# Patient Record
Sex: Female | Born: 1975
Health system: Southern US, Community
[De-identification: ages and names within clinical notes are randomized; demographics above are authoritative.]

## PROBLEM LIST (undated history)

## (undated) DIAGNOSIS — I781 Nevus, non-neoplastic: Secondary | ICD-10-CM

## (undated) DIAGNOSIS — K5900 Constipation, unspecified: Secondary | ICD-10-CM

## (undated) DIAGNOSIS — K635 Polyp of colon: Secondary | ICD-10-CM

## (undated) DIAGNOSIS — K649 Unspecified hemorrhoids: Secondary | ICD-10-CM

## (undated) DIAGNOSIS — B029 Zoster without complications: Secondary | ICD-10-CM

## (undated) DIAGNOSIS — K219 Gastro-esophageal reflux disease without esophagitis: Secondary | ICD-10-CM

## (undated) DIAGNOSIS — E041 Nontoxic single thyroid nodule: Secondary | ICD-10-CM

## (undated) DIAGNOSIS — G43909 Migraine, unspecified, not intractable, without status migrainosus: Secondary | ICD-10-CM

## (undated) DIAGNOSIS — R87629 Unspecified abnormal cytological findings in specimens from vagina: Secondary | ICD-10-CM

## (undated) DIAGNOSIS — J302 Other seasonal allergic rhinitis: Secondary | ICD-10-CM

## (undated) DIAGNOSIS — F419 Anxiety disorder, unspecified: Secondary | ICD-10-CM

## (undated) DIAGNOSIS — D219 Benign neoplasm of connective and other soft tissue, unspecified: Secondary | ICD-10-CM

## (undated) DIAGNOSIS — R7303 Prediabetes: Secondary | ICD-10-CM

## (undated) DIAGNOSIS — M1990 Unspecified osteoarthritis, unspecified site: Secondary | ICD-10-CM

## (undated) DIAGNOSIS — F32A Depression, unspecified: Secondary | ICD-10-CM

## (undated) DIAGNOSIS — I10 Essential (primary) hypertension: Secondary | ICD-10-CM

## (undated) DIAGNOSIS — D259 Leiomyoma of uterus, unspecified: Secondary | ICD-10-CM

## (undated) DIAGNOSIS — H9319 Tinnitus, unspecified ear: Secondary | ICD-10-CM

## (undated) DIAGNOSIS — Z8759 Personal history of other complications of pregnancy, childbirth and the puerperium: Secondary | ICD-10-CM

## (undated) DIAGNOSIS — M2550 Pain in unspecified joint: Secondary | ICD-10-CM

## (undated) DIAGNOSIS — E042 Nontoxic multinodular goiter: Secondary | ICD-10-CM

## (undated) DIAGNOSIS — N920 Excessive and frequent menstruation with regular cycle: Secondary | ICD-10-CM

## (undated) DIAGNOSIS — R1033 Periumbilical pain: Secondary | ICD-10-CM

## (undated) DIAGNOSIS — D649 Anemia, unspecified: Secondary | ICD-10-CM

## (undated) DIAGNOSIS — R600 Localized edema: Secondary | ICD-10-CM

## (undated) HISTORY — DX: Periumbilical pain: R10.33

## (undated) HISTORY — PX: CYSTECTOMY: SUR359

## (undated) HISTORY — DX: Polyp of colon: K63.5

## (undated) HISTORY — DX: Tinnitus, unspecified ear: H93.19

## (undated) HISTORY — DX: Unspecified abnormal cytological findings in specimens from vagina: R87.629

## (undated) HISTORY — DX: Nontoxic single thyroid nodule: E04.1

## (undated) HISTORY — PX: WISDOM TOOTH EXTRACTION: SHX21

## (undated) HISTORY — DX: Localized edema: R60.0

## (undated) HISTORY — PX: COLONOSCOPY, ESOPHAGOGASTRODUODENOSCOPY (EGD) AND ESOPHAGEAL DILATION: SHX5781

## (undated) HISTORY — DX: Excessive and frequent menstruation with regular cycle: N92.0

## (undated) HISTORY — DX: Unspecified hemorrhoids: K64.9

## (undated) HISTORY — DX: Other seasonal allergic rhinitis: J30.2

## (undated) HISTORY — PX: GANGLION CYST EXCISION: SHX1691

## (undated) HISTORY — DX: Pain in unspecified joint: M25.50

## (undated) HISTORY — DX: Constipation, unspecified: K59.00

## (undated) HISTORY — DX: Nevus, non-neoplastic: I78.1

## (undated) HISTORY — DX: Morbid (severe) obesity due to excess calories: E66.01

## (undated) HISTORY — PX: COLONOSCOPY: SHX174

---
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
## 1998-10-17 ENCOUNTER — Emergency Department (HOSPITAL_COMMUNITY): Admission: EM | Admit: 1998-10-17 | Discharge: 1998-10-17 | Payer: Self-pay | Admitting: Emergency Medicine

## 1999-06-23 ENCOUNTER — Inpatient Hospital Stay: Admission: AD | Admit: 1999-06-23 | Discharge: 1999-06-23 | Payer: Self-pay | Admitting: Obstetrics and Gynecology

## 1999-09-30 ENCOUNTER — Emergency Department (HOSPITAL_COMMUNITY): Admission: EM | Admit: 1999-09-30 | Discharge: 1999-09-30 | Payer: Self-pay | Admitting: Emergency Medicine

## 2000-09-29 ENCOUNTER — Other Ambulatory Visit: Admission: RE | Admit: 2000-09-29 | Discharge: 2000-09-29 | Payer: Self-pay | Admitting: *Deleted

## 2000-10-02 ENCOUNTER — Encounter: Admission: RE | Admit: 2000-10-02 | Discharge: 2000-10-02 | Payer: Self-pay | Admitting: Family Medicine

## 2000-10-02 ENCOUNTER — Encounter: Payer: Self-pay | Admitting: Family Medicine

## 2000-11-05 ENCOUNTER — Emergency Department (HOSPITAL_COMMUNITY): Admission: EM | Admit: 2000-11-05 | Discharge: 2000-11-06 | Payer: Self-pay | Admitting: Emergency Medicine

## 2000-11-05 ENCOUNTER — Encounter: Payer: Self-pay | Admitting: Emergency Medicine

## 2000-11-09 ENCOUNTER — Observation Stay (HOSPITAL_COMMUNITY): Admission: AD | Admit: 2000-11-09 | Discharge: 2000-11-10 | Payer: Self-pay | Admitting: Obstetrics and Gynecology

## 2000-11-10 ENCOUNTER — Encounter: Payer: Self-pay | Admitting: Obstetrics and Gynecology

## 2000-12-04 ENCOUNTER — Other Ambulatory Visit: Admission: RE | Admit: 2000-12-04 | Discharge: 2000-12-04 | Payer: Self-pay | Admitting: Obstetrics and Gynecology

## 2001-04-25 ENCOUNTER — Inpatient Hospital Stay (HOSPITAL_COMMUNITY): Admission: AD | Admit: 2001-04-25 | Discharge: 2001-04-25 | Payer: Self-pay | Admitting: Obstetrics and Gynecology

## 2001-04-26 ENCOUNTER — Inpatient Hospital Stay (HOSPITAL_COMMUNITY): Admission: AD | Admit: 2001-04-26 | Discharge: 2001-04-26 | Payer: Self-pay | Admitting: Obstetrics and Gynecology

## 2001-06-13 ENCOUNTER — Inpatient Hospital Stay (HOSPITAL_COMMUNITY): Admission: AD | Admit: 2001-06-13 | Discharge: 2001-06-16 | Payer: Self-pay | Admitting: Obstetrics and Gynecology

## 2001-06-18 ENCOUNTER — Encounter: Admission: RE | Admit: 2001-06-18 | Discharge: 2001-07-18 | Payer: Self-pay | Admitting: Obstetrics and Gynecology

## 2001-09-18 ENCOUNTER — Other Ambulatory Visit: Admission: RE | Admit: 2001-09-18 | Discharge: 2001-09-18 | Payer: Self-pay | Admitting: Obstetrics and Gynecology

## 2001-09-28 ENCOUNTER — Emergency Department (HOSPITAL_COMMUNITY): Admission: EM | Admit: 2001-09-28 | Discharge: 2001-09-29 | Payer: Self-pay | Admitting: Emergency Medicine

## 2002-04-07 ENCOUNTER — Emergency Department (HOSPITAL_COMMUNITY): Admission: EM | Admit: 2002-04-07 | Discharge: 2002-04-07 | Payer: Self-pay | Admitting: Emergency Medicine

## 2002-06-26 ENCOUNTER — Encounter: Payer: Self-pay | Admitting: Emergency Medicine

## 2002-06-26 ENCOUNTER — Emergency Department (HOSPITAL_COMMUNITY): Admission: EM | Admit: 2002-06-26 | Discharge: 2002-06-26 | Payer: Self-pay | Admitting: Emergency Medicine

## 2002-10-03 ENCOUNTER — Emergency Department (HOSPITAL_COMMUNITY): Admission: EM | Admit: 2002-10-03 | Discharge: 2002-10-04 | Payer: Self-pay | Admitting: Emergency Medicine

## 2003-05-01 ENCOUNTER — Emergency Department (HOSPITAL_COMMUNITY): Admission: EM | Admit: 2003-05-01 | Discharge: 2003-05-01 | Payer: Self-pay | Admitting: Emergency Medicine

## 2003-09-20 ENCOUNTER — Emergency Department (HOSPITAL_COMMUNITY): Admission: AD | Admit: 2003-09-20 | Discharge: 2003-09-20 | Payer: Self-pay | Admitting: Family Medicine

## 2003-09-29 ENCOUNTER — Emergency Department (HOSPITAL_COMMUNITY): Admission: AD | Admit: 2003-09-29 | Discharge: 2003-09-29 | Payer: Self-pay | Admitting: Family Medicine

## 2003-12-28 ENCOUNTER — Emergency Department (HOSPITAL_COMMUNITY): Admission: AD | Admit: 2003-12-28 | Discharge: 2003-12-28 | Payer: Self-pay | Admitting: Family Medicine

## 2004-02-02 ENCOUNTER — Emergency Department (HOSPITAL_COMMUNITY): Admission: EM | Admit: 2004-02-02 | Discharge: 2004-02-02 | Payer: Self-pay | Admitting: Family Medicine

## 2004-10-04 ENCOUNTER — Encounter: Admission: RE | Admit: 2004-10-04 | Discharge: 2004-10-04 | Payer: Self-pay | Admitting: Family Medicine

## 2005-10-18 ENCOUNTER — Encounter: Admission: RE | Admit: 2005-10-18 | Discharge: 2006-01-16 | Payer: Self-pay | Admitting: Family Medicine

## 2006-02-27 ENCOUNTER — Emergency Department (HOSPITAL_COMMUNITY): Admission: EM | Admit: 2006-02-27 | Discharge: 2006-02-27 | Payer: Self-pay | Admitting: Family Medicine

## 2006-03-01 ENCOUNTER — Emergency Department (HOSPITAL_COMMUNITY): Admission: EM | Admit: 2006-03-01 | Discharge: 2006-03-01 | Payer: Self-pay | Admitting: Emergency Medicine

## 2006-09-27 ENCOUNTER — Emergency Department (HOSPITAL_COMMUNITY): Admission: EM | Admit: 2006-09-27 | Discharge: 2006-09-27 | Payer: Self-pay | Admitting: *Deleted

## 2007-04-09 ENCOUNTER — Emergency Department (HOSPITAL_COMMUNITY): Admission: EM | Admit: 2007-04-09 | Discharge: 2007-04-09 | Payer: Self-pay | Admitting: Emergency Medicine

## 2007-04-16 ENCOUNTER — Ambulatory Visit: Payer: Self-pay | Admitting: Internal Medicine

## 2007-04-23 ENCOUNTER — Ambulatory Visit: Payer: Self-pay | Admitting: *Deleted

## 2007-04-27 ENCOUNTER — Ambulatory Visit: Payer: Self-pay | Admitting: Internal Medicine

## 2007-05-01 ENCOUNTER — Ambulatory Visit: Payer: Self-pay | Admitting: Internal Medicine

## 2007-05-10 ENCOUNTER — Ambulatory Visit: Payer: Self-pay | Admitting: Internal Medicine

## 2007-08-30 ENCOUNTER — Telehealth (INDEPENDENT_AMBULATORY_CARE_PROVIDER_SITE_OTHER): Payer: Self-pay | Admitting: Internal Medicine

## 2007-08-31 ENCOUNTER — Ambulatory Visit: Payer: Self-pay | Admitting: Internal Medicine

## 2007-08-31 DIAGNOSIS — J45909 Unspecified asthma, uncomplicated: Secondary | ICD-10-CM

## 2007-08-31 DIAGNOSIS — B9789 Other viral agents as the cause of diseases classified elsewhere: Secondary | ICD-10-CM | POA: Insufficient documentation

## 2007-08-31 DIAGNOSIS — J309 Allergic rhinitis, unspecified: Secondary | ICD-10-CM

## 2007-08-31 HISTORY — DX: Allergic rhinitis, unspecified: J30.9

## 2007-08-31 HISTORY — DX: Unspecified asthma, uncomplicated: J45.909

## 2008-02-06 ENCOUNTER — Emergency Department (HOSPITAL_COMMUNITY): Admission: EM | Admit: 2008-02-06 | Discharge: 2008-02-06 | Payer: Self-pay | Admitting: Emergency Medicine

## 2008-02-06 ENCOUNTER — Telehealth (INDEPENDENT_AMBULATORY_CARE_PROVIDER_SITE_OTHER): Payer: Self-pay | Admitting: Internal Medicine

## 2008-02-28 ENCOUNTER — Encounter (INDEPENDENT_AMBULATORY_CARE_PROVIDER_SITE_OTHER): Payer: Self-pay | Admitting: Internal Medicine

## 2008-05-22 ENCOUNTER — Encounter: Admission: RE | Admit: 2008-05-22 | Discharge: 2008-05-22 | Payer: Self-pay | Admitting: Rheumatology

## 2008-06-06 ENCOUNTER — Encounter (INDEPENDENT_AMBULATORY_CARE_PROVIDER_SITE_OTHER): Payer: Self-pay | Admitting: Internal Medicine

## 2008-06-06 DIAGNOSIS — E559 Vitamin D deficiency, unspecified: Secondary | ICD-10-CM | POA: Insufficient documentation

## 2008-07-15 ENCOUNTER — Encounter (INDEPENDENT_AMBULATORY_CARE_PROVIDER_SITE_OTHER): Payer: Self-pay | Admitting: Orthopedic Surgery

## 2008-07-15 ENCOUNTER — Ambulatory Visit (HOSPITAL_BASED_OUTPATIENT_CLINIC_OR_DEPARTMENT_OTHER): Admission: RE | Admit: 2008-07-15 | Discharge: 2008-07-15 | Payer: Self-pay | Admitting: Orthopedic Surgery

## 2008-08-19 ENCOUNTER — Emergency Department (HOSPITAL_COMMUNITY): Admission: EM | Admit: 2008-08-19 | Discharge: 2008-08-19 | Payer: Self-pay | Admitting: Emergency Medicine

## 2008-11-05 ENCOUNTER — Emergency Department (HOSPITAL_COMMUNITY): Admission: EM | Admit: 2008-11-05 | Discharge: 2008-11-05 | Payer: Self-pay | Admitting: Emergency Medicine

## 2009-03-11 ENCOUNTER — Encounter (INDEPENDENT_AMBULATORY_CARE_PROVIDER_SITE_OTHER): Payer: Self-pay | Admitting: Internal Medicine

## 2009-06-08 ENCOUNTER — Emergency Department (HOSPITAL_COMMUNITY): Admission: EM | Admit: 2009-06-08 | Discharge: 2009-06-08 | Payer: Self-pay | Admitting: Emergency Medicine

## 2009-06-22 ENCOUNTER — Inpatient Hospital Stay (HOSPITAL_COMMUNITY): Admission: AD | Admit: 2009-06-22 | Discharge: 2009-06-22 | Payer: Self-pay | Admitting: Obstetrics and Gynecology

## 2009-07-13 ENCOUNTER — Inpatient Hospital Stay (HOSPITAL_COMMUNITY): Admission: AD | Admit: 2009-07-13 | Discharge: 2009-07-13 | Payer: Self-pay | Admitting: Obstetrics and Gynecology

## 2009-07-19 ENCOUNTER — Inpatient Hospital Stay (HOSPITAL_COMMUNITY): Admission: AD | Admit: 2009-07-19 | Discharge: 2009-07-19 | Payer: Self-pay | Admitting: Obstetrics and Gynecology

## 2009-10-31 ENCOUNTER — Inpatient Hospital Stay (HOSPITAL_COMMUNITY): Admission: AD | Admit: 2009-10-31 | Discharge: 2009-10-31 | Payer: Self-pay | Admitting: Obstetrics & Gynecology

## 2009-12-06 ENCOUNTER — Emergency Department (HOSPITAL_COMMUNITY): Admission: EM | Admit: 2009-12-06 | Discharge: 2009-12-06 | Payer: Self-pay | Admitting: Family Medicine

## 2010-05-26 ENCOUNTER — Emergency Department (HOSPITAL_COMMUNITY): Admission: EM | Admit: 2010-05-26 | Discharge: 2010-05-26 | Payer: Self-pay | Admitting: Emergency Medicine

## 2010-09-23 ENCOUNTER — Inpatient Hospital Stay (HOSPITAL_COMMUNITY): Admission: AD | Admit: 2010-09-23 | Discharge: 2010-01-14 | Payer: Self-pay | Admitting: Obstetrics and Gynecology

## 2010-10-13 ENCOUNTER — Emergency Department (HOSPITAL_COMMUNITY)
Admission: EM | Admit: 2010-10-13 | Discharge: 2010-10-14 | Payer: Self-pay | Source: Home / Self Care | Admitting: Emergency Medicine

## 2010-11-18 NOTE — Miscellaneous (Signed)
Summary: Record update.  Clinical Lists Changes  Problems: Added new problem of UNSPECIFIED VITAMIN D DEFICIENCY (ICD-268.9) - Dr. Debarah Crape. seen for hand and wrist tenderness and soreness. Medications: Added new medication of VITAMIN D (ERGOCALCIFEROL) 50000 UNIT CAPS (ERGOCALCIFEROL) 1 tab by mouth monthly for 3 months:  Dr. Corliss Skains

## 2010-11-18 NOTE — Assessment & Plan Note (Signed)
Summary: CONGESTED/DIARREAH//KT   Vital Signs:  Patient Profile:   35 Years Old Female Height:     62 inches Weight:      178 pounds BMI:     32.67 BSA:     1.82 Temp:     97.0 degrees F oral Pulse rate:   68 / minute Pulse rhythm:   regular Resp:     18 per minute BP sitting:   130 / 78  (left arm)  Pt. in pain?   yes  Vitals Entered By: Gennaro Africa, CMA              Is Patient Diabetic? No  Does patient need assistance? Functional Status Self care Ambulation Normal     Chief Complaint:  body aches, congestion, and sore throat.  History of Present Illness: 5 days ago--developed muscle aches in legs, Chest tightness and dyspnea.  Felt okay for 2 days, then redeveloped muscle aches 2 days ago, head congestion, headaches.  Started Thera Flu and aches better.  Head congestion, ear and throat pain.  Has also had nausea, vomiting, and diarrhea--last was yesterday.  Po intake not good.  Taking fluids, but not well.  Highest temp was 99.3 yesterday.  Using Flonase 2 sprays each nostril once daily, Allegra D 60 mg twice daily.          Physical Exam  General:     alert.   Eyes:     No corneal or conjunctival inflammation noted. EOMI. Perrla. Funduscopic exam benign, without hemorrhages, exudates or papilledema. Vision grossly normal. Ears:     External ear exam shows no significant lesions or deformities.  Otoscopic examination reveals clear canals, tympanic membranes are intact bilaterally without bulging, retraction, inflammation or discharge. Hearing is grossly normal bilaterally. Nose:     mucosal erythema and mucosal edema. clear discharge.  Mouth:     pharynx pink and moist.   Neck:     No deformities, masses, or tenderness noted. Lungs:     Normal respiratory effort, chest expands symmetrically. Lungs are clear to auscultation, no crackles or wheezes. Heart:     Normal rate and regular rhythm. S1 and S2 normal without gallop, murmur, click, rub or other  extra sounds.    Impression & Recommendations:  Problem # 1:  VIRAL INFECTION (ICD-079.99) Push fluids Motrin as needed sore throat, aches Continue Allegra D and Flonase.  Problem # 2:  ASTHMA (ICD-493.90) Needs to pick up Advair--has been available at [facility] for some time.  updated medication list for this problem includes:    Albuterol 90 Mcg/act Aers (Albuterol) .Marland Kitchen... 2 puffs q 4h as needed wheezing  Her updated medication list for this problem includes:    Albuterol 90 Mcg/act Aers (Albuterol) .Marland Kitchen... 2 puffs q 4h as needed wheezing    Advair Diskus 250-50 Mcg/dose Misc (Fluticasone-salmeterol) .Marland Kitchen... 1 inhalation two times a day   Complete Medication List: 1)  Albuterol 90 Mcg/act Aers (Albuterol) .... 2 puffs q 4h as needed wheezing 2)  Advair Diskus 250-50 Mcg/dose Misc (Fluticasone-salmeterol) .Marland Kitchen.. 1 inhalation two times a day   Patient Instructions: 1)  Go pick up Advair at [facility] today.--one inhalation twice daily. 2)  Ibuprofen 600 mg every 6 hours with food as needed for sore throat, headache, aches. 3)  Continue Allegra D and Flonase. 4)  Continue Albuterol as needed every four hours. 5)  Saline nasal spray as needed for congestion.    Prescriptions: ALBUTEROL 90 MCG/ACT  AERS (ALBUTEROL) 2  puffs q 4h as needed wheezing  #1 x 0   Entered and Authorized by:   Julieanne Manson MD   Signed by:   Julieanne Manson MD on 08/31/2007   Method used:   Print then Give to Patient   RxID:   [id number]  ]

## 2010-11-18 NOTE — Progress Notes (Signed)
Summary: office visit  Phone Note Call from Patient Call back at Home Phone [phone] Call back at [phone]   Caller: Patient Summary of Call: The patient is under various health problems including chest pain, nasal congestion, cough, sore throat and back and leg pain. Dr. Delrae Alfred Initial call taken by: Manon Hilding,  August 30, 2007 8:38 AM  Follow-up for Phone Call        Appt Scheduled Follow-up by: Vesta Mixer CMA,  August 31, 2007 2:12 PM

## 2010-11-18 NOTE — Letter (Signed)
Summary: External Correspondence  External Correspondence   Imported By: Arta Bruce 05/05/2009 12:17:38  _____________________________________________________________________  External Attachment:    Type:   Image     Comment:   External Document

## 2010-11-18 NOTE — Progress Notes (Signed)
  Phone Note Outgoing Call   Summary of Call: Tiffany, This has not been addressed on paper chart--multiple faxes for prior authorization.  Please call pt. asap and find out what antihistamines she has used in the past and whether they worked.  Also, call and see what is covered ([employer]). Initial call taken by: Julieanne Manson MD,  February 06, 2008 9:39 AM  Follow-up for Phone Call        Left message on answering machine for pt to return call  Follow-up by: Ashlyn Cartner SMA,  February 07, 2008 9:04 AM  Additional Follow-up for Phone Call Additional follow up Details #1::        Left message on answering machine for pt to return call  Additional Follow-up by: Vesta Mixer CMA,  Feb 19, 2008 12:43 PM

## 2010-11-18 NOTE — Letter (Signed)
Summary: MAILED RECORDS TO [facility]UMED [facility]RGENT CARE  MAILED RECORDS TO [facility]UMED [facility]RGENT [facility]   Imported By: Arta Bruce 02/28/2008 14:44:47  _____________________________________________________________________  External Attachment:    Type:   Image     Comment:   External Document

## 2010-12-29 ENCOUNTER — Emergency Department (HOSPITAL_COMMUNITY)
Admission: EM | Admit: 2010-12-29 | Discharge: 2010-12-30 | Disposition: A | Discharge status: Home / Self Care | Payer: BC Managed Care – PPO | Attending: Emergency Medicine | Admitting: Emergency Medicine

## 2010-12-29 DIAGNOSIS — J45909 Unspecified asthma, uncomplicated: Secondary | ICD-10-CM | POA: Insufficient documentation

## 2010-12-29 DIAGNOSIS — H109 Unspecified conjunctivitis: Secondary | ICD-10-CM | POA: Insufficient documentation

## 2010-12-29 DIAGNOSIS — H11419 Vascular abnormalities of conjunctiva, unspecified eye: Secondary | ICD-10-CM | POA: Insufficient documentation

## 2010-12-29 DIAGNOSIS — H5789 Other specified disorders of eye and adnexa: Secondary | ICD-10-CM | POA: Insufficient documentation

## 2010-12-31 ENCOUNTER — Emergency Department (HOSPITAL_COMMUNITY): Payer: BC Managed Care – PPO

## 2010-12-31 ENCOUNTER — Emergency Department (HOSPITAL_COMMUNITY)
Admission: EM | Admit: 2010-12-31 | Discharge: 2010-12-31 | Disposition: A | Discharge status: Home / Self Care | Payer: BC Managed Care – PPO | Attending: Emergency Medicine | Admitting: Emergency Medicine

## 2010-12-31 DIAGNOSIS — H5710 Ocular pain, unspecified eye: Secondary | ICD-10-CM | POA: Insufficient documentation

## 2010-12-31 DIAGNOSIS — H109 Unspecified conjunctivitis: Secondary | ICD-10-CM | POA: Insufficient documentation

## 2010-12-31 DIAGNOSIS — H538 Other visual disturbances: Secondary | ICD-10-CM | POA: Insufficient documentation

## 2010-12-31 DIAGNOSIS — I10 Essential (primary) hypertension: Secondary | ICD-10-CM | POA: Insufficient documentation

## 2010-12-31 LAB — COMPREHENSIVE METABOLIC PANEL
ALT: 16 U/L (ref 0–35)
AST: 16 U/L (ref 0–37)
Albumin: 3.4 g/dL — ABNORMAL LOW (ref 3.5–5.2)
Alkaline Phosphatase: 57 U/L (ref 39–117)
BUN: 13 mg/dL (ref 6–23)
CO2: 26 mEq/L (ref 19–32)
Calcium: 9.2 mg/dL (ref 8.4–10.5)
Chloride: 103 mEq/L (ref 96–112)
Creatinine, Ser: 0.78 mg/dL (ref 0.4–1.2)
GFR calc Af Amer: >60 mL/min (ref >60)
GFR calc non Af Amer: >60 mL/min (ref >60)
Glucose, Bld: 93 mg/dL (ref 70–99)
Potassium: 3.8 mEq/L (ref 3.5–5.1)
Sodium: 137 mEq/L (ref 135–145)
Total Bilirubin: 0.9 mg/dL (ref 0.3–1.2)
Total Protein: 7.4 g/dL (ref 6.0–8.3)

## 2010-12-31 LAB — PREGNANCY, URINE: Preg Test, Ur: NEGATIVE

## 2010-12-31 LAB — URINALYSIS, ROUTINE W REFLEX MICROSCOPIC
Bilirubin Urine: NEGATIVE
Glucose, UA: NEGATIVE mg/dL
Hgb urine dipstick: NEGATIVE
Ketones, ur: NEGATIVE mg/dL
Nitrite: NEGATIVE
Protein, ur: NEGATIVE mg/dL
Specific Gravity, Urine: 1.011 (ref 1.005–1.030)
Urobilinogen, UA: 0.2 mg/dL (ref 0.0–1.0)
pH: 7.5 (ref 5.0–8.0)

## 2010-12-31 LAB — DIFFERENTIAL
Basophils Absolute: 0 10*3/uL (ref 0.0–0.1)
Basophils Relative: 0 % (ref 0–1)
Eosinophils Absolute: 0 10*3/uL (ref 0.0–0.7)
Eosinophils Relative: 0 % (ref 0–5)
Lymphocytes Relative: 15 % (ref 12–46)
Lymphs Abs: 1.5 10*3/uL (ref 0.7–4.0)
Monocytes Absolute: 0.8 10*3/uL (ref 0.1–1.0)
Monocytes Relative: 8 % (ref 3–12)
Neutro Abs: 7.3 10*3/uL (ref 1.7–7.7)
Neutrophils Relative %: 76 % (ref 43–77)

## 2010-12-31 LAB — CBC
HCT: 36 % (ref 36.0–46.0)
Hemoglobin: 12.4 g/dL (ref 12.0–15.0)
MCH: 29.9 pg (ref 26.0–34.0)
MCHC: 34.3 g/dL (ref 30.0–36.0)
MCV: 87.2 fL (ref 78.0–100.0)
Platelets: 254 10*3/uL (ref 150–400)
RBC: 4.13 MIL/uL (ref 3.87–5.11)
RDW: 13.9 % (ref 11.5–15.5)
WBC: 9.7 10*3/uL (ref 4.0–10.5)

## 2010-12-31 LAB — D-DIMER, QUANTITATIVE: D-Dimer, Quant: <0.22 ug/mL-FEU (ref 0.00–0.48)

## 2010-12-31 MED ORDER — IOHEXOL 300 MG/ML  SOLN
100.0000 mL | Freq: Once | INTRAMUSCULAR | Status: AC | PRN
  Administered 2010-12-31: 100 mL via INTRAVENOUS

## 2011-01-01 LAB — URINALYSIS, ROUTINE W REFLEX MICROSCOPIC
Bilirubin Urine: NEGATIVE
Glucose, UA: NEGATIVE mg/dL
Hgb urine dipstick: NEGATIVE
Ketones, ur: 15 mg/dL — AB
Nitrite: NEGATIVE
Protein, ur: NEGATIVE mg/dL
Specific Gravity, Urine: 1.015 (ref 1.005–1.030)
Urobilinogen, UA: 0.2 mg/dL (ref 0.0–1.0)
pH: 7 (ref 5.0–8.0)

## 2011-01-02 ENCOUNTER — Emergency Department (HOSPITAL_COMMUNITY)
Admission: EM | Admit: 2011-01-02 | Discharge: 2011-01-02 | Disposition: A | Discharge status: Home / Self Care | Payer: BC Managed Care – PPO | Attending: Emergency Medicine | Admitting: Emergency Medicine

## 2011-01-02 DIAGNOSIS — H109 Unspecified conjunctivitis: Secondary | ICD-10-CM | POA: Insufficient documentation

## 2011-01-02 DIAGNOSIS — J45909 Unspecified asthma, uncomplicated: Secondary | ICD-10-CM | POA: Insufficient documentation

## 2011-01-07 LAB — CBC
HCT: 31.1 % — ABNORMAL LOW (ref 36.0–46.0)
HCT: 35.3 % — ABNORMAL LOW (ref 36.0–46.0)
Hemoglobin: 10.6 g/dL — ABNORMAL LOW (ref 12.0–15.0)
Hemoglobin: 11.8 g/dL — ABNORMAL LOW (ref 12.0–15.0)
MCHC: 33.5 g/dL (ref 30.0–36.0)
MCHC: 34 g/dL (ref 30.0–36.0)
MCV: 89.2 fL (ref 78.0–100.0)
MCV: 89.9 fL (ref 78.0–100.0)
Platelets: 171 10*3/uL (ref 150–400)
Platelets: 207 10*3/uL (ref 150–400)
RBC: 3.47 MIL/uL — ABNORMAL LOW (ref 3.87–5.11)
RBC: 3.95 MIL/uL (ref 3.87–5.11)
RDW: 14.4 % (ref 11.5–15.5)
RDW: 14.7 % (ref 11.5–15.5)
WBC: 15.5 10*3/uL — ABNORMAL HIGH (ref 4.0–10.5)
WBC: 8.4 10*3/uL (ref 4.0–10.5)

## 2011-01-07 LAB — RPR: RPR Ser Ql: NONREACTIVE

## 2011-01-20 LAB — URINE MICROSCOPIC-ADD ON

## 2011-01-20 LAB — COMPREHENSIVE METABOLIC PANEL
ALT: 15 U/L (ref 0–35)
AST: 19 U/L (ref 0–37)
Albumin: 3.1 g/dL — ABNORMAL LOW (ref 3.5–5.2)
Alkaline Phosphatase: 40 U/L (ref 39–117)
BUN: 12 mg/dL (ref 6–23)
CO2: 24 mEq/L (ref 19–32)
Calcium: 9.5 mg/dL (ref 8.4–10.5)
Chloride: 103 mEq/L (ref 96–112)
Creatinine, Ser: 0.64 mg/dL (ref 0.4–1.2)
GFR calc Af Amer: >60 mL/min (ref >60)
GFR calc non Af Amer: >60 mL/min (ref >60)
Glucose, Bld: 88 mg/dL (ref 70–99)
Potassium: 3.6 mEq/L (ref 3.5–5.1)
Sodium: 136 mEq/L (ref 135–145)
Total Bilirubin: 0.6 mg/dL (ref 0.3–1.2)
Total Protein: 6.9 g/dL (ref 6.0–8.3)

## 2011-01-20 LAB — DIFFERENTIAL
Basophils Absolute: 0 10*3/uL (ref 0.0–0.1)
Basophils Relative: 0 % (ref 0–1)
Eosinophils Absolute: 0.1 10*3/uL (ref 0.0–0.7)
Eosinophils Relative: 1 % (ref 0–5)
Lymphocytes Relative: 14 % (ref 12–46)
Lymphs Abs: 1.5 10*3/uL (ref 0.7–4.0)
Monocytes Absolute: 0.9 10*3/uL (ref 0.1–1.0)
Monocytes Relative: 8 % (ref 3–12)
Neutro Abs: 8.6 10*3/uL — ABNORMAL HIGH (ref 1.7–7.7)
Neutrophils Relative %: 77 % (ref 43–77)

## 2011-01-20 LAB — URINE CULTURE: Colony Count: >=100000

## 2011-01-20 LAB — URINALYSIS, ROUTINE W REFLEX MICROSCOPIC
Bilirubin Urine: NEGATIVE
Glucose, UA: NEGATIVE mg/dL
Ketones, ur: 15 mg/dL — AB
Nitrite: NEGATIVE
Protein, ur: 30 mg/dL — AB
Specific Gravity, Urine: 1.02 (ref 1.005–1.030)
Urobilinogen, UA: 0.2 mg/dL (ref 0.0–1.0)
pH: 7 (ref 5.0–8.0)

## 2011-01-20 LAB — CBC
HCT: 34.1 % — ABNORMAL LOW (ref 36.0–46.0)
Hemoglobin: 11.6 g/dL — ABNORMAL LOW (ref 12.0–15.0)
MCHC: 34.1 g/dL (ref 30.0–36.0)
MCV: 90.1 fL (ref 78.0–100.0)
Platelets: 244 10*3/uL (ref 150–400)
RBC: 3.79 MIL/uL — ABNORMAL LOW (ref 3.87–5.11)
RDW: 13.5 % (ref 11.5–15.5)
WBC: 11.1 10*3/uL — ABNORMAL HIGH (ref 4.0–10.5)

## 2011-01-20 LAB — GC/CHLAMYDIA PROBE AMP, GENITAL
Chlamydia, DNA Probe: NEGATIVE
GC Probe Amp, Genital: NEGATIVE

## 2011-01-20 LAB — WET PREP, GENITAL
Trich, Wet Prep: NONE SEEN
Yeast Wet Prep HPF POC: NONE SEEN

## 2011-01-21 LAB — CBC
HCT: 35 % — ABNORMAL LOW (ref 36.0–46.0)
Hemoglobin: 12 g/dL (ref 12.0–15.0)
MCHC: 34.2 g/dL (ref 30.0–36.0)
MCV: 89.7 fL (ref 78.0–100.0)
Platelets: 254 10*3/uL (ref 150–400)
RBC: 3.9 MIL/uL (ref 3.87–5.11)
RDW: 13.4 % (ref 11.5–15.5)
WBC: 7.1 10*3/uL (ref 4.0–10.5)

## 2011-01-21 LAB — URINALYSIS, ROUTINE W REFLEX MICROSCOPIC
Bilirubin Urine: NEGATIVE
Glucose, UA: NEGATIVE mg/dL
Hgb urine dipstick: NEGATIVE
Ketones, ur: 15 mg/dL — AB
Nitrite: NEGATIVE
Protein, ur: NEGATIVE mg/dL
Specific Gravity, Urine: 1.02 (ref 1.005–1.030)
Urobilinogen, UA: 1 mg/dL (ref 0.0–1.0)
pH: 8 (ref 5.0–8.0)

## 2011-01-21 LAB — DIFFERENTIAL
Basophils Absolute: 0 10*3/uL (ref 0.0–0.1)
Basophils Relative: 0 % (ref 0–1)
Eosinophils Absolute: 0 10*3/uL (ref 0.0–0.7)
Eosinophils Relative: 1 % (ref 0–5)
Lymphocytes Relative: 17 % (ref 12–46)
Lymphs Abs: 1.2 10*3/uL (ref 0.7–4.0)
Monocytes Absolute: 0.6 10*3/uL (ref 0.1–1.0)
Monocytes Relative: 9 % (ref 3–12)
Neutro Abs: 5.2 10*3/uL (ref 1.7–7.7)
Neutrophils Relative %: 73 % (ref 43–77)

## 2011-01-21 LAB — COMPREHENSIVE METABOLIC PANEL
ALT: 16 U/L (ref 0–35)
AST: 15 U/L (ref 0–37)
Albumin: 3.2 g/dL — ABNORMAL LOW (ref 3.5–5.2)
Alkaline Phosphatase: 36 U/L — ABNORMAL LOW (ref 39–117)
BUN: 9 mg/dL (ref 6–23)
CO2: 24 mEq/L (ref 19–32)
Calcium: 9.3 mg/dL (ref 8.4–10.5)
Chloride: 103 mEq/L (ref 96–112)
Creatinine, Ser: 0.55 mg/dL (ref 0.4–1.2)
GFR calc Af Amer: >60 mL/min (ref >60)
GFR calc non Af Amer: >60 mL/min (ref >60)
Glucose, Bld: 81 mg/dL (ref 70–99)
Potassium: 3.6 mEq/L (ref 3.5–5.1)
Sodium: 136 mEq/L (ref 135–145)
Total Bilirubin: 0.7 mg/dL (ref 0.3–1.2)
Total Protein: 6.8 g/dL (ref 6.0–8.3)

## 2011-01-21 LAB — AMYLASE: Amylase: 78 U/L (ref 27–131)

## 2011-01-22 LAB — URINALYSIS, ROUTINE W REFLEX MICROSCOPIC
Bilirubin Urine: NEGATIVE
Glucose, UA: NEGATIVE mg/dL
Hgb urine dipstick: NEGATIVE
Ketones, ur: NEGATIVE mg/dL
Nitrite: NEGATIVE
Protein, ur: NEGATIVE mg/dL
Specific Gravity, Urine: 1.029 (ref 1.005–1.030)
Urobilinogen, UA: 1 mg/dL (ref 0.0–1.0)
pH: 7 (ref 5.0–8.0)

## 2011-01-22 LAB — BASIC METABOLIC PANEL
BUN: 15 mg/dL (ref 6–23)
CO2: 27 mEq/L (ref 19–32)
Calcium: 9.3 mg/dL (ref 8.4–10.5)
Chloride: 104 mEq/L (ref 96–112)
Creatinine, Ser: 0.72 mg/dL (ref 0.4–1.2)
GFR calc Af Amer: >60 mL/min (ref >60)
GFR calc non Af Amer: >60 mL/min (ref >60)
Glucose, Bld: 96 mg/dL (ref 70–99)
Potassium: 3.6 mEq/L (ref 3.5–5.1)
Sodium: 135 mEq/L (ref 135–145)

## 2011-01-22 LAB — GC/CHLAMYDIA PROBE AMP, GENITAL
Chlamydia, DNA Probe: NEGATIVE
GC Probe Amp, Genital: NEGATIVE

## 2011-01-22 LAB — DIFFERENTIAL
Basophils Absolute: 0 10*3/uL (ref 0.0–0.1)
Basophils Relative: 0 % (ref 0–1)
Eosinophils Absolute: 0.1 10*3/uL (ref 0.0–0.7)
Eosinophils Relative: 1 % (ref 0–5)
Lymphocytes Relative: 19 % (ref 12–46)
Lymphs Abs: 1.6 10*3/uL (ref 0.7–4.0)
Monocytes Absolute: 0.7 10*3/uL (ref 0.1–1.0)
Monocytes Relative: 8 % (ref 3–12)
Neutro Abs: 5.9 10*3/uL (ref 1.7–7.7)
Neutrophils Relative %: 72 % (ref 43–77)

## 2011-01-22 LAB — CBC
HCT: 34.9 % — ABNORMAL LOW (ref 36.0–46.0)
Hemoglobin: 11.9 g/dL — ABNORMAL LOW (ref 12.0–15.0)
MCHC: 34 g/dL (ref 30.0–36.0)
MCV: 88.9 fL (ref 78.0–100.0)
Platelets: 219 10*3/uL (ref 150–400)
RBC: 3.93 MIL/uL (ref 3.87–5.11)
RDW: 13.7 % (ref 11.5–15.5)
WBC: 8.3 10*3/uL (ref 4.0–10.5)

## 2011-01-22 LAB — PREGNANCY, URINE: Preg Test, Ur: POSITIVE

## 2011-01-22 LAB — WET PREP, GENITAL
Trich, Wet Prep: NONE SEEN
WBC, Wet Prep HPF POC: NONE SEEN

## 2011-01-22 LAB — HCG, QUANTITATIVE, PREGNANCY: hCG, Beta Chain, Quant, S: 53285 m[IU]/mL — ABNORMAL HIGH (ref <5)

## 2011-01-25 ENCOUNTER — Inpatient Hospital Stay (INDEPENDENT_AMBULATORY_CARE_PROVIDER_SITE_OTHER)
Admission: RE | Admit: 2011-01-25 | Discharge: 2011-01-25 | Disposition: A | Discharge status: Home / Self Care | Payer: BC Managed Care – PPO | Source: Ambulatory Visit | Attending: Emergency Medicine | Admitting: Emergency Medicine

## 2011-01-25 DIAGNOSIS — R112 Nausea with vomiting, unspecified: Secondary | ICD-10-CM

## 2011-01-25 DIAGNOSIS — R51 Headache: Secondary | ICD-10-CM

## 2011-01-25 LAB — POCT URINALYSIS DIP (DEVICE)
Bilirubin Urine: NEGATIVE
Glucose, UA: NEGATIVE mg/dL
Hgb urine dipstick: NEGATIVE
Ketones, ur: NEGATIVE mg/dL
Nitrite: NEGATIVE
Protein, ur: NEGATIVE mg/dL
Specific Gravity, Urine: 1.025 (ref 1.005–1.030)
Urobilinogen, UA: 0.2 mg/dL (ref 0.0–1.0)
pH: 6.5 (ref 5.0–8.0)

## 2011-01-25 LAB — POCT PREGNANCY, URINE: Preg Test, Ur: NEGATIVE

## 2011-01-31 LAB — URINALYSIS, ROUTINE W REFLEX MICROSCOPIC
Bilirubin Urine: NEGATIVE
Glucose, UA: NEGATIVE mg/dL
Hgb urine dipstick: NEGATIVE
Ketones, ur: 15 mg/dL — AB
Nitrite: NEGATIVE
Protein, ur: NEGATIVE mg/dL
Specific Gravity, Urine: 1.011 (ref 1.005–1.030)
Urobilinogen, UA: 1 mg/dL (ref 0.0–1.0)
pH: 6.5 (ref 5.0–8.0)

## 2011-01-31 LAB — PREGNANCY, URINE: Preg Test, Ur: NEGATIVE

## 2011-03-01 NOTE — Op Note (Signed)
Audrey, [name]                ACCOUNT NO.:  [id number]   MEDICAL RECORD NO.:  [id number]          PATIENT TYPE:  AMB   LOCATION:  DSC                          FACILITY:  [facility]   PHYSICIAN:  Katy Fitch. Sypher, M.D. DATE OF BIRTH:  Jan 25, 1976   DATE OF PROCEDURE:  07/15/2008  DATE OF DISCHARGE:                               OPERATIVE REPORT   PREOPERATIVE DIAGNOSIS:  Enlarging mass, dorsal aspect, left wrist  consistent with a myxomatous or ganglion cyst.   POSTOPERATIVE DIAGNOSIS:  Transretinacular myxomatous ganglion cyst.   OPERATION:  Excision of transretinacular ganglion from dorsal aspect of  scapholunate interosseous ligament.   OPERATING SURGEON:  Katy Fitch. Sypher, MD   ASSISTANT:  Marveen Reeks Dasnoit, PA-C   ANESTHESIA:  General by LMA.   SUPERVISING ANESTHESIOLOGIST:  Quita Skye. Krista Blue, MD   INDICATIONS:  Audrey [name] is a 35 year old woman referred through the  courtesy of Dr. Pollyann Savoy for evaluation and management of a  dorsal left wrist cyst.   She has a history of experiencing increasing pain in the dorsal aspect  of the left wrist followed by identification of a mass.  She is kindly  referred by Dr. Corliss Skains for an upper extremity orthopedic consult.   Clinical examination revealed a probable subretinacular ganglion cyst  that had then gone transretinacular to the subcutaneous region.   She had no impairment of finger or thumb motion.  There was no sign of a  foreign body reaction or significant inflammatory synovitis noted.   Preoperatively, she was advised of management choices including  observation, rupture, and cyst excision.   She understands that each choice has its potential risks and benefits.  She stated she would prefer to have the cyst removed.  We advised her to  proceed with surgical removal.  She understands that we cannot guarantee  long-term resolution of a ganglion cyst due to the degenerative nature  of this process.   We did  describe the technique of surgical excision, capsular  exploration, and debridement of the ligaments.  Questions were invited  and answered in detail.   PROCEDURE:  [name] was brought to the operating room and placed in  the supine position upon the operating table.   Following the induction of general anesthesia by LMA technique, the left  arm was prepped with Betadine soap solution and sterilely draped.  A  pneumatic tourniquet was applied to the proximal left brachium.   Following exsanguination of the left arm with an Esmarch bandage, an  arterial tourniquet in the proximal brachium was inflated to 220 mmHg.  The procedure commenced with a short transverse scission directly over  the mass.  Subcutaneous tissues were carefully divided taking care to  identify the dorsal veins.  The cyst was extending directly through the  extensor retinaculum.  This was meticulously dissected away from the  retinaculum and its central aspect and stalk were followed directly to  the scapholunate interosseous ligament between the second and fourth  dorsal compartments.   The cyst was circumferentially dissected down to the scapholunate  ligament proper.  We removed  it with a micro rongeur followed by use of  a curette and rongeur to carefully debride the dorsal fibers of the  scapholunate interosseous ligament.   This was a typical origin of a ganglion cyst.  The capsulotomy was left  open.  The margins of the capsulotomy were electrocauterized with  bipolar current for hemostasis and hopeful destruction of the myxomatous  cells.   The wound was then irrigated and closed with subdermal sutures of 4-0  Vicryl and intradermal through a Prolene with Steri-Strips.   There were no apparent complications.  Ms. Malphrus was placed in a  compressive dressing with a volar plaster splint maintaining the wrist  in 5 degrees of dorsiflexion.   For aftercare, she is provided prescription for Percocet 5  mg one p.o.  q.4-6 h. p.r.n. pain, 20 tablets without refill.      Katy Fitch Sypher, M.D.  Electronically Signed     RVS/MEDQ  D:  07/15/2008  T:  07/15/2008  Job:  [id number]
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
## 2011-03-04 NOTE — Op Note (Signed)
[facility]  Patient:    Audrey [name], Audrey [name] Visit Number: [id number] MRN: [id number]          Service Type: OBS Location: 910A 9133 01 Attending Physician:  Leonard Schwartz Proc. Date: 06/13/01 Adm. Date:  06/13/2001                             Operative Report  PREOPERATIVE DIAGNOSES:         1. Term intrauterine pregnancy.                                 2. Nonreassuring fetal heart rate tracing.                                 3. Meconium stained amniotic fluid.  POSTOPERATIVE DIAGNOSES:        1. Term intrauterine pregnancy.                                 2. Nonreassuring fetal heart rate tracing.                                 3. Meconium stained amniotic fluid.                                 4. Occult cord prolapse.                                 5. Occipitoposterior presentation.                                 6. True knot in the cord.  OPERATION:                      Primary low transverse cesarean section.  SURGEON:                        Janine Limbo, M.D.  FIRST ASSISTANT:                Wynelle Bourgeois, C.N.M.  ANESTHESIA:                     Epidural.  INDICATIONS:                    Ms. Helzer is a 35 year old female, gravida 2, para 0-0-1-0 who presents at 39-1/[redacted] weeks gestation in labor.  She dilated her cervix to 5 cm but progressed no further.  Pitocin augmentation was begun, but the patient began having late decelerations.  The late decelerations would decrease once we stopped the Pitocin but then would return when Pitocin was again started.  The patient was noted to have meconium stained amniotic fluid. An amnioinfusion was performed.  The patient understood the indications for her procedure, and she accepted the risks of, but not limited to, anesthetic complications, bleeding, infections and possible damage to the surrounding organs.  FINDINGS:  Female infant, 6 pounds 8 ounces, (Ezekial)  was delivered from an occipitoposterior presentation.  The Apgars were 7 at one minute and 9 at five minutes.  There was no meconium noted beneath the vocal cords.  The uterus, fallopian tubes, and ovaries were normal for the gravid state.  DESCRIPTION OF PROCEDURE:       The patient was taken to the operating room where an epidural catheter was noted to be adequate for her cesarean delivery. She had received an epidural for labor.  The patients abdomen was prepped with multiple layers of Betadine and then sterilely draped.  A low transverse incision was made in the abdomen and carried sharply through the subcutaneous tissue, the fascia and the anterior peritoneum.  An incision was made in the lower uterine segment and extended transversely.  The fetal head was delivered.  The mouth and nose were suctioned using the DeLee trap.  The remainder of the infant was delivered.  The cord was clamped and cut and infant was handed to the waiting pediatric team.  Routine cord blood studies were obtained.  The placenta was removed.  The uterine cavity was cleaned of amniotic fluid, clotted blood, and membranes.  The uterine incision was closed using a running locking suture of 2-0 Vicryl.  Figure-of-eight sutures of 2-0 Vicryl were used for hemostasis.  The pericolonic gutters were cleaned of amniotic fluid and clotted blood.  Vigorous irrigation was accomplished.  The anterior peritoneum and the abdominal musculature were reapproximated in the midline using 2-0 Vicryl.  The fascia was closed using a running suture of 0 Vicryl followed by three interrupted sutures of 0 Vicryl.  The subcutaneous layer was irrigated.  Hemostasis was adequate.  The subcutaneous layer was closed using a running suture of 2-0 Vicryl.  The skin was reapproximated using skin staples.  Sponge, needle, and instrument counts were correct on two occasions.  The estimated blood loss for the procedure was 700 cc.  The patient  tolerated her procedure well.  The patient was taken to the recovery room in stable condition.  The infant was taken to the full-term nursery in stable condition. Attending Physician:  Leonard Schwartz DD:  06/13/01 TD:  06/13/01 Job: [phone]

## 2011-03-04 NOTE — H&P (Signed)
[facility]  Patient:    Audrey [name], Audrey [name]                         MRN: [id number] Adm. Date:  [id number] Disc. Date: [id number] Attending:  Shelba Flake Dictator:   Nigel Bridgeman, C.N.M.                         History and Physical  DATE OF BIRTH:                03/05/1976  HISTORY OF PRESENT ILLNESS:    Audrey [name] is a 35 year old, gravida 1, para 1, at approximately 8 to [redacted] weeks gestation who presented for IV hydration secondary to hyperemesis.  She was seen by her primary MD over the last several weeks for nausea and vomiting and was diagnosed with gastroesophageal reflux disease.  She was placed on Nexium with minimal benefit.  She had several negative UPTs during this time. She had a negative gallbladder ultrasound on Sunday at [facility] for continued nausea and vomiting.  A urine pregnancy test this week was found to be positive.  The patient was then referred to [facility].  She was seen at the office today with 3+ ketones.  She has been unable to keep anything down for the last several days.  She previously was keeping potato chips down.  While she was in the hospital this evening for observation, she received 2 bags of IV fluids. She still has been unable to have the opportunity to try any significant fluid intake or food.  She also has no transportation this evening now at 10 p.m. to go home.  Decision was made to admit her for 23-hour observation for reevaluation in the a.m. and advancement to a diet in the morning with observation for tolerance.  LABORATORY DATA:              Labs done tonight showed a quantitative HCG 144,289.  Catheterized urinalysis after 2 bags of IV fluids showed specific gravity of 1.015 and all else negative.  OBSTETRICAL HISTORY:          The patient is a primigravida.  Her last menstrual period was approximately September 09, 2000.  She does have a history of very irregular periods.  PAST  MEDICAL HISTORY:         She was diagnosed with gastroesophageal reflux disease over the last several months.  This began back in October or November. She does have a history of significantly irregular periods when not on birth control pills.  She had two periods in October and the period in November was her last menstrual period.  FAMILY HISTORY:               The patients mother has non-Hodgkins lymphoma.  Her maternal grandfather  and maternal aunt also had non-Hodgkins lymphoma. She has uncles with stomach cancer.  She also has a family history of hypertension and diabetes.  SOCIAL HISTORY:               The patient is single.  She is employed at [employer].  She lives by herself.  PAST SURGICAL HISTORY:        None.  PHYSICAL EXAMINATION:  VITAL SIGNS:                  Stable.  The patient is afebrile.  HEENT:  Remarkable for dry mucous membranes of the lips.  LUNGS:                        Bilateral breath sounds are clear.  HEART:                        Regular rate and rhythm without murmur.  BREASTS:                      Soft and nontender.  ABDOMEN:                      Soft and nontender.  BACK:                         Negative CVA tenderness.  PELVIC:                       Exam is deferred.  EXTREMITIES:                  Deep tendon reflexes are 2+ without clonus. There is a trace edema noted.  IMPRESSION:                   1. Hyperemesis.                               2. Gastroesophageal reflux disease.                               3. History of irregular cycles.  PLAN:                         1. Admit for 23-hour observation per consult                                  with Dr. Dierdre Forth as attending                                  physician.                               2. OB ultrasound in the morning for gestational                                  dating.                               3. Offer a diet in the morning.                                4. Plan Phenergan prescription on discharge.                               5. Anticipate discharge in the a.m. DD:  11/10/99 TD:  11/09/00 Job: [id number]

## 2011-03-04 NOTE — Discharge Summary (Signed)
[facility]  Patient:    Audrey [name], Audrey [name] Visit Number: [id number] MRN: [id number]          Service Type: OBS Location: [id number] Attending Physician:  Leonard Schwartz Dictated by:   Philipp Deputy, C.N.M. Admit Date:  06/13/2001 Disc. Date: 06/16/01                             Discharge Summary  DATE OF BIRTH:                1976-07-11.  ADMITTING DIAGNOSES:          1. Intrauterine pregnancy at term.                               2. Active labor.  DISCHARGE DIAGNOSES:          1. Intrauterine pregnancy at term.                               2. Nonreassuring fetal heart rate tracing.                               3. Occult cord prolapse.                               4. Occiput posterior position.                               5. Meconium fluid.                               6. True knot.  PROCEDURE:                    1. Primary low transverse cesarean section.                               2. Epidural anesthesia.  HOSPITAL COURSE:              Audrey [name] is a 35 year old, gravida 2, para 0-0-1-0, at 39-4/7 weeks who was admitted from the office in early labor June 13, 2001. Her pregnancy had been remarkable for (1) first trimester medication exposure, (2) history of STDs, (3) history of irregular cycles. (4) history of gastroesophageal reflux disease, (5) history of migraines, (6) rubella nonimmune, and (7) group B strep positive. At approximately one hour after her admission to the hospital, the patient began having late decelerations for 10 minutes which resolved after amniotomy for a small amount of thick meconium-stained amniotic fluid. At that point, an intrauterine pressure catheter was inserted and an amnioinfusion was started. Fetal heart rate improved before having some additional questionable late versus early decelerations. At that point her cervix had progressed to 5 cm and completely effaced. Amnioinfusion was continued.  Approximately two hours after that, there was a return to repetitive late decelerations as well as a deceleration to the 80s x two minutes. At that point, her [place] units had reached between 120 and 150 on 2 milliunits of Pitocin. Her cervix remained unchanged at  5 cm. At that point Dr. Stefano Gaul was consulted and the patient was prepared for a primary low transverse cesarean section. The patient tolerated the procedure well with the delivery of a viable female, Ezekial. Weight was 6 pounds 8 ounces. Apgars were 7 and 9 from an occiput posterior position as well as with an occult cord prolapse and a true knot noted in the cord. The infant was taken to the full-term nursery and mom was taken to recovery in good condition.  By postoperative day #1 the patient was doing well. Physical exam was within normal limits. The incision was clean, dry, and intact. The patients hemoglobin was 10.7 and predelivery had been 13.6. She was tolerating a regular diet. The rest of the postoperative stay was unremarkable. She was breast and bottle feeding and planned Micronor for contraception. By postoperative day #3 she was deemed to have received the benefit of her hospital stay and was discharged home.  DISCHARGE INSTRUCTIONS:       Instructions were per [facility] handout.  DISCHARGE MEDICATIONS:        1. Motrin 600 mg p.o. q.6h. p.r.n.                               2. Tylox one to two p.o. q.3-4h. p.r.n.                               3. Prenatal vitamin one p.o.  q.d.  DISCHARGE FOLLOWUP:           The patient is to follow up in six weeks at [facility]. Dictated by:   Philipp Deputy, C.N.M. Attending Physician:  Leonard Schwartz DD:  06/16/01 TD:  06/16/01 Job: [id number]

## 2011-03-16 ENCOUNTER — Inpatient Hospital Stay (INDEPENDENT_AMBULATORY_CARE_PROVIDER_SITE_OTHER)
Admission: RE | Admit: 2011-03-16 | Discharge: 2011-03-16 | Disposition: A | Discharge status: Home / Self Care | Payer: Self-pay | Source: Ambulatory Visit | Attending: Family Medicine | Admitting: Family Medicine

## 2011-03-16 DIAGNOSIS — T7840XA Allergy, unspecified, initial encounter: Secondary | ICD-10-CM

## 2011-07-18 LAB — POCT HEMOGLOBIN-HEMACUE: Hemoglobin: 12.4

## 2011-08-03 LAB — RAPID STREP SCREEN (MED CTR MEBANE ONLY): Streptococcus, Group A Screen (Direct): NEGATIVE

## 2011-08-14 ENCOUNTER — Emergency Department (HOSPITAL_COMMUNITY)
Admission: EM | Admit: 2011-08-14 | Discharge: 2011-08-15 | Disposition: A | Discharge status: Home / Self Care | Payer: Medicaid Other | Attending: Emergency Medicine | Admitting: Emergency Medicine

## 2011-08-14 DIAGNOSIS — I10 Essential (primary) hypertension: Secondary | ICD-10-CM | POA: Insufficient documentation

## 2011-08-14 DIAGNOSIS — R21 Rash and other nonspecific skin eruption: Secondary | ICD-10-CM | POA: Insufficient documentation

## 2011-08-14 DIAGNOSIS — J45909 Unspecified asthma, uncomplicated: Secondary | ICD-10-CM | POA: Insufficient documentation

## 2011-08-15 LAB — GLUCOSE, CAPILLARY: Glucose-Capillary: 104 mg/dL — ABNORMAL HIGH (ref 70–99)

## 2011-12-27 ENCOUNTER — Emergency Department (HOSPITAL_COMMUNITY)
Admission: EM | Admit: 2011-12-27 | Discharge: 2011-12-28 | Disposition: A | Discharge status: Home / Self Care | Payer: Medicare HMO | Attending: Emergency Medicine | Admitting: Emergency Medicine

## 2011-12-27 ENCOUNTER — Other Ambulatory Visit: Payer: Self-pay

## 2011-12-27 ENCOUNTER — Emergency Department (HOSPITAL_COMMUNITY): Payer: Medicare HMO

## 2011-12-27 ENCOUNTER — Encounter (HOSPITAL_COMMUNITY): Payer: Self-pay | Admitting: Emergency Medicine

## 2011-12-27 DIAGNOSIS — J45909 Unspecified asthma, uncomplicated: Secondary | ICD-10-CM | POA: Insufficient documentation

## 2011-12-27 DIAGNOSIS — R071 Chest pain on breathing: Secondary | ICD-10-CM | POA: Insufficient documentation

## 2011-12-27 DIAGNOSIS — R0781 Pleurodynia: Secondary | ICD-10-CM

## 2011-12-27 HISTORY — DX: Personal history of other complications of pregnancy, childbirth and the puerperium: Z87.59

## 2011-12-27 LAB — POCT I-STAT TROPONIN I: Troponin i, poc: 0 ng/mL (ref 0.00–0.08)

## 2011-12-27 LAB — CBC
HCT: 37.7 % (ref 36.0–46.0)
Hemoglobin: 12.8 g/dL (ref 12.0–15.0)
MCH: 29.2 pg (ref 26.0–34.0)
MCHC: 34 g/dL (ref 30.0–36.0)
MCV: 85.9 fL (ref 78.0–100.0)
Platelets: 299 10*3/uL (ref 150–400)
RBC: 4.39 MIL/uL (ref 3.87–5.11)
RDW: 13.3 % (ref 11.5–15.5)
WBC: 9.9 10*3/uL (ref 4.0–10.5)

## 2011-12-27 LAB — BASIC METABOLIC PANEL
BUN: 20 mg/dL (ref 6–23)
CO2: 24 mEq/L (ref 19–32)
Calcium: 9.6 mg/dL (ref 8.4–10.5)
Chloride: 99 mEq/L (ref 96–112)
Creatinine, Ser: 0.77 mg/dL (ref 0.50–1.10)
GFR calc Af Amer: >90 mL/min (ref >90)
GFR calc non Af Amer: >90 mL/min (ref >90)
Glucose, Bld: 114 mg/dL — ABNORMAL HIGH (ref 70–99)
Potassium: 3.6 mEq/L (ref 3.5–5.1)
Sodium: 134 mEq/L — ABNORMAL LOW (ref 135–145)

## 2011-12-27 LAB — DIFFERENTIAL
Basophils Absolute: 0 10*3/uL (ref 0.0–0.1)
Basophils Relative: 0 % (ref 0–1)
Eosinophils Absolute: 0.3 10*3/uL (ref 0.0–0.7)
Eosinophils Relative: 3 % (ref 0–5)
Lymphocytes Relative: 28 % (ref 12–46)
Lymphs Abs: 2.8 10*3/uL (ref 0.7–4.0)
Monocytes Absolute: 0.4 10*3/uL (ref 0.1–1.0)
Monocytes Relative: 4 % (ref 3–12)
Neutro Abs: 6.4 10*3/uL (ref 1.7–7.7)
Neutrophils Relative %: 65 % (ref 43–77)

## 2011-12-27 MED ORDER — HYDROCODONE-ACETAMINOPHEN 5-325 MG PO TABS: 2.0000 | ORAL_TABLET | ORAL | Status: AC | PRN

## 2011-12-27 MED ORDER — KETOROLAC TROMETHAMINE 60 MG/2ML IM SOLN
60.0000 mg | Freq: Once | INTRAMUSCULAR | Status: AC
  Administered 2011-12-28: 60 mg via INTRAMUSCULAR
  Filled 2011-12-27: qty 2

## 2011-12-27 MED ORDER — IBUPROFEN 600 MG PO TABS: 600.0000 mg | ORAL_TABLET | Freq: Four times a day (QID) | ORAL | Status: AC | PRN

## 2011-12-27 NOTE — ED Provider Notes (Addendum)
History     CSN: [id number]  Arrival date & time 12/27/11  2150   First MD Initiated Contact with Patient 12/27/11 2317      Chief Complaint  Patient presents with  . Chest Pain    HPI PT. REPORTS LEFT CHEST PAIN WITH SOB AND PRODUCTIVE COUGH WORSE WITH DEEP INSPIRATION/COUGH ONSET THIS MORNING , DENIES NAUSEA OR DIAPHORESIS.  Similar chest pain workups have been negative in the past.  Patient has no cardiac history.  Past Medical History  Diagnosis Date  . Asthma   . Abortion history     Past Surgical History  Procedure Date  . Cesarean section     No family history on file.  History  Substance Use Topics  . Smoking status: Never Smoker   . Smokeless tobacco: Not on file  . Alcohol Use: No    OB History    Grav Para Term Preterm Abortions TAB SAB Ect Mult Living                  Review of Systems  All other systems reviewed and are negative.    Allergies  Nyamyc  Home Medications   Current Outpatient Rx  Name Route Sig Dispense Refill  . ALBUTEROL SULFATE HFA 108 (90 BASE) MCG/ACT IN AERS Inhalation Inhale 2 puffs into the lungs every 6 (six) hours as needed. For shortness of breath    . IBUPROFEN 200 MG PO TABS Oral Take 200 mg by mouth every 6 (six) hours as needed. For pain    . HYDROCODONE-ACETAMINOPHEN 5-325 MG PO TABS Oral Take 2 tablets by mouth every 4 (four) hours as needed for pain. 10 tablet 0  . IBUPROFEN 600 MG PO TABS Oral Take 1 tablet (600 mg total) by mouth every 6 (six) hours as needed for pain. 30 tablet 0    BP 173/96  Pulse 76  Temp(Src) 97.6 F (36.4 C) (Oral)  Resp 24  SpO2 100%  LMP 12/18/2011  Physical Exam  Nursing note and vitals reviewed. Constitutional: She is oriented to person, place, and time. She appears well-developed and well-nourished. No distress.  HENT:  Head: Normocephalic and atraumatic.  Eyes: Pupils are equal, round, and reactive to light.  Neck: Normal range of motion.  Cardiovascular: Normal rate  and intact distal pulses.        Heart rate equal 73 Axis is normal Normal sinus rhythm Normal ECG  Pulmonary/Chest: No respiratory distress.  Abdominal: Normal appearance. She exhibits no distension.  Musculoskeletal: Normal range of motion.  Neurological: She is alert and oriented to person, place, and time. No cranial nerve deficit.  Skin: Skin is warm and dry. No rash noted.  Psychiatric: She has a normal mood and affect. Her behavior is normal.    ED Course  Procedures (including critical care time)  Labs Reviewed  BASIC METABOLIC PANEL - Abnormal; Notable for the following:    Sodium 134 (*)    Glucose, Bld 114 (*)    All other components within normal limits  CBC  DIFFERENTIAL  POCT I-STAT TROPONIN I   Dg Chest 2 View  12/27/2011  *RADIOLOGY REPORT*  Clinical Data: Chest pain  CHEST - 2 VIEW  Comparison: 05/26/2010  Findings: Normal heart size.  Right lower lobe granuloma. Otherwise clear lungs.  No pneumothorax or pleural effusion.  IMPRESSION: No active cardiopulmonary disease.  Original Report Authenticated By: Donavan Burnet, M.D.     1. Chest pain, pleuritic  MDM          Nelia Shi, MD 12/27/11 4401  Nelia Shi, MD 03/15/12 [phone]

## 2011-12-27 NOTE — ED Notes (Signed)
PT. REPORTS LEFT CHEST PAIN WITH SOB AND PRODUCTIVE COUGH WORSE WITH DEEP INSPIRATION/COUGH ONSET THIS MORNING , DENIES NAUSEA OR DIAPHORESIS.

## 2011-12-27 NOTE — Discharge Instructions (Signed)
Cough, Adult  A cough is a reflex that helps clear your throat and airways. It can help heal the body or may be a reaction to an irritated airway. A cough may only last 2 or 3 weeks (acute) or may last more than 8 weeks (chronic).  CAUSES Acute cough:  Viral or bacterial infections.  Chronic cough:  Infections.   Allergies.   Asthma.   Post-nasal drip.   Smoking.   Heartburn or acid reflux.   Some medicines.   Chronic lung problems (COPD).   Cancer.  SYMPTOMS   Cough.   Fever.   Chest pain.   Increased breathing rate.   High-pitched whistling sound when breathing (wheezing).   Colored mucus that you cough up (sputum).  TREATMENT   A bacterial cough may be treated with antibiotic medicine.   A viral cough must run its course and will not respond to antibiotics.   Your caregiver may recommend other treatments if you have a chronic cough.  HOME CARE INSTRUCTIONS   Only take over-the-counter or prescription medicines for pain, discomfort, or fever as directed by your caregiver. Use cough suppressants only as directed by your caregiver.   Use a cold steam vaporizer or humidifier in your bedroom or home to help loosen secretions.   Sleep in a semi-upright position if your cough is worse at night.   Rest as needed.   Stop smoking if you smoke.  SEEK IMMEDIATE MEDICAL CARE IF:   You have pus in your sputum.   Your cough starts to worsen.   You cannot control your cough with suppressants and are losing sleep.   You begin coughing up blood.   You have difficulty breathing.   You develop pain which is getting worse or is uncontrolled with medicine.   You have a fever.  MAKE SURE YOU:   Understand these instructions.   Will watch your condition.   Will get help right away if you are not doing well or get worse.  Document Released: 04/01/2011 Document Revised: 09/22/2011 Document Reviewed: 04/01/2011 [facility] Patient Information 2012 [place],  [place].Chest Pain, Nonspecific It is often hard to give a specific diagnosis for the cause of chest pain. There is always a chance that your pain could be related to something serious, like a heart attack or a blood clot in the lungs. You need to follow up with your caregiver for further evaluation. More lab tests or other studies such as X-rays, electrocardiography, stress testing, or cardiac imaging may be needed to find the cause of your pain. Most of the time, nonspecific chest pain improves within 2 to 3 days with rest and mild pain medicine. For the next few days, avoid physical exertion or activities that bring on pain. Do not smoke. Avoid drinking alcohol. Call your caregiver for routine follow-up as advised.  SEEK IMMEDIATE MEDICAL CARE IF:  You develop increased chest pain or pain that radiates to the arm, neck, jaw, back, or abdomen.   You develop shortness of breath, increased coughing, or you start coughing up blood.   You have severe back or abdominal pain, nausea, or vomiting.   You develop severe weakness, fainting, fever, or chills.  Document Released: 10/03/2005 Document Revised: 09/22/2011 Document Reviewed: 03/23/2007 [facility] Patient Information 2012 [place], [place].

## 2012-10-11 ENCOUNTER — Emergency Department (HOSPITAL_COMMUNITY): Payer: Medicare HMO

## 2012-10-11 ENCOUNTER — Encounter (HOSPITAL_COMMUNITY): Payer: Self-pay | Admitting: Emergency Medicine

## 2012-10-11 ENCOUNTER — Emergency Department (HOSPITAL_COMMUNITY)
Admission: EM | Admit: 2012-10-11 | Discharge: 2012-10-11 | Disposition: A | Discharge status: Home / Self Care | Payer: Medicare HMO | Attending: Emergency Medicine | Admitting: Emergency Medicine

## 2012-10-11 DIAGNOSIS — J069 Acute upper respiratory infection, unspecified: Secondary | ICD-10-CM

## 2012-10-11 DIAGNOSIS — R0789 Other chest pain: Secondary | ICD-10-CM | POA: Insufficient documentation

## 2012-10-11 DIAGNOSIS — Z3202 Encounter for pregnancy test, result negative: Secondary | ICD-10-CM | POA: Insufficient documentation

## 2012-10-11 DIAGNOSIS — J111 Influenza due to unidentified influenza virus with other respiratory manifestations: Secondary | ICD-10-CM | POA: Insufficient documentation

## 2012-10-11 DIAGNOSIS — R51 Headache: Secondary | ICD-10-CM | POA: Insufficient documentation

## 2012-10-11 DIAGNOSIS — J45909 Unspecified asthma, uncomplicated: Secondary | ICD-10-CM | POA: Insufficient documentation

## 2012-10-11 DIAGNOSIS — J029 Acute pharyngitis, unspecified: Secondary | ICD-10-CM | POA: Insufficient documentation

## 2012-10-11 DIAGNOSIS — R059 Cough, unspecified: Secondary | ICD-10-CM | POA: Insufficient documentation

## 2012-10-11 DIAGNOSIS — R05 Cough: Secondary | ICD-10-CM | POA: Insufficient documentation

## 2012-10-11 DIAGNOSIS — Z79899 Other long term (current) drug therapy: Secondary | ICD-10-CM | POA: Insufficient documentation

## 2012-10-11 DIAGNOSIS — R195 Other fecal abnormalities: Secondary | ICD-10-CM | POA: Insufficient documentation

## 2012-10-11 DIAGNOSIS — R197 Diarrhea, unspecified: Secondary | ICD-10-CM | POA: Insufficient documentation

## 2012-10-11 LAB — COMPREHENSIVE METABOLIC PANEL
ALT: 18 U/L (ref 0–35)
AST: 18 U/L (ref 0–37)
Albumin: 3.4 g/dL — ABNORMAL LOW (ref 3.5–5.2)
Alkaline Phosphatase: 75 U/L (ref 39–117)
BUN: 13 mg/dL (ref 6–23)
CO2: 29 mEq/L (ref 19–32)
Calcium: 10.3 mg/dL (ref 8.4–10.5)
Chloride: 100 mEq/L (ref 96–112)
Creatinine, Ser: 0.81 mg/dL (ref 0.50–1.10)
GFR calc Af Amer: >90 mL/min (ref >90)
GFR calc non Af Amer: >90 mL/min (ref >90)
Glucose, Bld: 130 mg/dL — ABNORMAL HIGH (ref 70–99)
Potassium: 4.5 mEq/L (ref 3.5–5.1)
Sodium: 137 mEq/L (ref 135–145)
Total Bilirubin: 0.3 mg/dL (ref 0.3–1.2)
Total Protein: 8.2 g/dL (ref 6.0–8.3)

## 2012-10-11 LAB — URINALYSIS, ROUTINE W REFLEX MICROSCOPIC
Bilirubin Urine: NEGATIVE
Glucose, UA: NEGATIVE mg/dL
Hgb urine dipstick: NEGATIVE
Ketones, ur: NEGATIVE mg/dL
Leukocytes, UA: NEGATIVE
Nitrite: NEGATIVE
Protein, ur: NEGATIVE mg/dL
Specific Gravity, Urine: 1.01 (ref 1.005–1.030)
Urobilinogen, UA: 0.2 mg/dL (ref 0.0–1.0)
pH: 6 (ref 5.0–8.0)

## 2012-10-11 LAB — CBC WITH DIFFERENTIAL/PLATELET
Basophils Absolute: 0 10*3/uL (ref 0.0–0.1)
Basophils Relative: 0 % (ref 0–1)
Eosinophils Absolute: 0 10*3/uL (ref 0.0–0.7)
Eosinophils Relative: 1 % (ref 0–5)
HCT: 37.4 % (ref 36.0–46.0)
Hemoglobin: 12.8 g/dL (ref 12.0–15.0)
Lymphocytes Relative: 16 % (ref 12–46)
Lymphs Abs: 1.4 10*3/uL (ref 0.7–4.0)
MCH: 29.1 pg (ref 26.0–34.0)
MCHC: 34.2 g/dL (ref 30.0–36.0)
MCV: 85 fL (ref 78.0–100.0)
Monocytes Absolute: 0.5 10*3/uL (ref 0.1–1.0)
Monocytes Relative: 5 % (ref 3–12)
Neutro Abs: 6.7 10*3/uL (ref 1.7–7.7)
Neutrophils Relative %: 78 % — ABNORMAL HIGH (ref 43–77)
Platelets: 306 10*3/uL (ref 150–400)
RBC: 4.4 MIL/uL (ref 3.87–5.11)
RDW: 12.9 % (ref 11.5–15.5)
WBC: 8.7 10*3/uL (ref 4.0–10.5)

## 2012-10-11 LAB — RAPID STREP SCREEN (MED CTR MEBANE ONLY): Streptococcus, Group A Screen (Direct): NEGATIVE

## 2012-10-11 LAB — POCT PREGNANCY, URINE: Preg Test, Ur: NEGATIVE

## 2012-10-11 MED ORDER — SODIUM CHLORIDE 0.9 % IV BOLUS (SEPSIS)
1000.0000 mL | Freq: Once | INTRAVENOUS | Status: AC
  Administered 2012-10-11: 1000 mL via INTRAVENOUS

## 2012-10-11 MED ORDER — ONDANSETRON HCL 4 MG/2ML IJ SOLN
4.0000 mg | Freq: Once | INTRAMUSCULAR | Status: AC
  Administered 2012-10-11: 4 mg via INTRAVENOUS
  Filled 2012-10-11: qty 2

## 2012-10-11 MED ORDER — ALBUTEROL SULFATE HFA 108 (90 BASE) MCG/ACT IN AERS
2.0000 | INHALATION_SPRAY | RESPIRATORY_TRACT | Status: DC | PRN
  Filled 2012-10-11: qty 6.7

## 2012-10-11 NOTE — ED Notes (Signed)
Pt also c/o of sore throat that started last Friday when she was at work she ate an oat/fruit/nut bar and her throat tightened up. she took breathing treatment and benadryl and felt better but throat still hurts every time she coughs and swallows.

## 2012-10-11 NOTE — ED Notes (Signed)
Patient states that she has had cough and vomiting for the last 2 -3 days. The patient reports that she had an allergic reaction last week. Patient also reports that she has chest pain with a cough. The patient has horse voice.

## 2012-10-11 NOTE — ED Provider Notes (Signed)
History     CSN: [id number]  Arrival date & time 10/11/12  1404   First MD Initiated Contact with Patient 10/11/12 1543      Chief Complaint  Patient presents with  . Nausea  . Emesis  . Sore Throat  . Cough    (Consider location/radiation/quality/duration/timing/severity/associated sxs/prior treatment) Patient is a 36 y.o. female presenting with vomiting, pharyngitis, cough, and flu symptoms. The history is provided by the patient.  Emesis  This is a new problem. The current episode started 2 days ago. The problem occurs 2 to 4 times per day. The problem has been gradually worsening. The emesis has an appearance of stomach contents. Maximum temperature: felt hot but did not measure it. Fever duration: 3-4 days ago. Associated symptoms include chills, cough, diarrhea, headaches, myalgias and URI. Pertinent negatives include no abdominal pain. Risk factors include ill contacts.  Sore Throat Associated symptoms include chest pain and headaches. Pertinent negatives include no abdominal pain and no shortness of breath.  Cough Associated symptoms include chest pain, chills, headaches and myalgias. Pertinent negatives include no shortness of breath.  Influenza This is a new problem. Episode onset: 4-5 days ago. The problem occurs constantly. The problem has been gradually worsening. Associated symptoms include chest pain and headaches. Pertinent negatives include no abdominal pain and no shortness of breath. Associated symptoms comments: Chest pain with coughing, sore throat, fever 2 days ago.  Vomiting, loose stool. The symptoms are aggravated by eating and exertion. Nothing relieves the symptoms. She has tried rest and acetaminophen for the symptoms. The treatment provided no relief.    Past Medical History  Diagnosis Date  . Asthma   . Abortion history     Past Surgical History  Procedure Date  . Cesarean section     No family history on file.  History  Substance Use Topics    . Smoking status: Never Smoker   . Smokeless tobacco: Not on file  . Alcohol Use: No    OB History    Grav Para Term Preterm Abortions TAB SAB Ect Mult Living                  Review of Systems  Constitutional: Positive for chills and fatigue.  Respiratory: Positive for cough. Negative for shortness of breath.   Cardiovascular: Positive for chest pain.  Gastrointestinal: Positive for vomiting and diarrhea. Negative for abdominal pain.  Musculoskeletal: Positive for myalgias.  Neurological: Positive for headaches.  All other systems reviewed and are negative.    Allergies  Nyamyc  Home Medications   Current Outpatient Rx  Name  Route  Sig  Dispense  Refill  . ALBUTEROL SULFATE HFA 108 (90 BASE) MCG/ACT IN AERS   Inhalation   Inhale 2 puffs into the lungs every 6 (six) hours as needed. For shortness of breath         . DIPHENHYDRAMINE HCL 25 MG PO TABS   Oral   Take 25 mg by mouth every 6 (six) hours as needed. Throat swelling         . IBUPROFEN 200 MG PO TABS   Oral   Take 200 mg by mouth every 6 (six) hours as needed. For pain         . ALKA-SELTZER PLS ALLERGY & CGH PO   Oral   Take 1 capsule by mouth every 6 (six) hours as needed. Cough/congestion         . PSEUDOEPHEDRINE HCL 30 MG PO TABS  Oral   Take 30 mg by mouth every 4 (four) hours as needed. congestion           BP 147/114  Pulse 85  Temp 98.8 F (37.1 C) (Oral)  Resp 18  SpO2 100%  LMP 09/11/2012  Physical Exam  Nursing note and vitals reviewed. Constitutional: She is oriented to person, place, and time. She appears well-developed and well-nourished. No distress.  HENT:  Head: Normocephalic and atraumatic.  Mouth/Throat: Mucous membranes are normal. Posterior oropharyngeal erythema present. No oropharyngeal exudate, posterior oropharyngeal edema or tonsillar abscesses.  Eyes: Conjunctivae normal and EOM are normal. Pupils are equal, round, and reactive to light.  Neck: Normal  range of motion. Neck supple.  Cardiovascular: Normal rate, regular rhythm and intact distal pulses.   No murmur heard. Pulmonary/Chest: Effort normal and breath sounds normal. No respiratory distress. She has no wheezes. She has no rales.       Coughing on exam  Abdominal: Soft. She exhibits no distension. There is no tenderness. There is no rebound and no guarding.  Musculoskeletal: Normal range of motion. She exhibits no edema and no tenderness.  Neurological: She is alert and oriented to person, place, and time.  Skin: Skin is warm and dry. No rash noted. No erythema.  Psychiatric: She has a normal mood and affect. Her behavior is normal.    ED Course  Procedures (including critical care time)  Labs Reviewed  CBC WITH DIFFERENTIAL - Abnormal; Notable for the following:    Neutrophils Relative 78 (*)     All other components within normal limits  COMPREHENSIVE METABOLIC PANEL - Abnormal; Notable for the following:    Glucose, Bld 130 (*)     Albumin 3.4 (*)     All other components within normal limits  URINALYSIS, ROUTINE W REFLEX MICROSCOPIC  RAPID STREP SCREEN  POCT PREGNANCY, URINE   Dg Chest 2 View  10/11/2012  *RADIOLOGY REPORT*  Clinical Data: Cough and congestion  CHEST - 2 VIEW  Comparison: 12/27/2011  Findings: Heart size upper normal.  Vascularity is normal.  Lungs are clear without infiltrate or effusion.  Calcified granuloma right lower lobe, unchanged.  IMPRESSION: No active cardiopulmonary disease.  No interval change.   Original Report Authenticated By: Janeece Riggers, M.D.      No diagnosis found.    MDM   Pt with symptoms consistent with influenza.  Normal exam and afebrile.  No signs of breathing difficulty, but coughing on exam.  No signs of strep pharyngitis, otitis or abnormal abdominal findings.   CXR wnl and rapid strep wnl.  Pt feeling better after IVF and zofran. Will continue antipyretica and rest and fluids and return for any further  problems.  7:29 PM All labs wnl.  Pt feeling better and tolerating po's.  Will d/c home.       Gwyneth Sprout, MD 10/11/12 1929

## 2013-05-13 ENCOUNTER — Observation Stay (HOSPITAL_COMMUNITY)
Admission: EM | Admit: 2013-05-13 | Discharge: 2013-05-14 | Disposition: A | Discharge status: Home / Self Care | Payer: Managed Care, Other (non HMO) | Attending: Internal Medicine | Admitting: Internal Medicine

## 2013-05-13 ENCOUNTER — Emergency Department (HOSPITAL_COMMUNITY): Payer: Managed Care, Other (non HMO)

## 2013-05-13 ENCOUNTER — Encounter (HOSPITAL_COMMUNITY): Payer: Self-pay | Admitting: Emergency Medicine

## 2013-05-13 DIAGNOSIS — E559 Vitamin D deficiency, unspecified: Secondary | ICD-10-CM

## 2013-05-13 DIAGNOSIS — I10 Essential (primary) hypertension: Secondary | ICD-10-CM | POA: Insufficient documentation

## 2013-05-13 DIAGNOSIS — R079 Chest pain, unspecified: Principal | ICD-10-CM | POA: Insufficient documentation

## 2013-05-13 DIAGNOSIS — J309 Allergic rhinitis, unspecified: Secondary | ICD-10-CM

## 2013-05-13 DIAGNOSIS — J45909 Unspecified asthma, uncomplicated: Secondary | ICD-10-CM | POA: Insufficient documentation

## 2013-05-13 DIAGNOSIS — G43909 Migraine, unspecified, not intractable, without status migrainosus: Secondary | ICD-10-CM | POA: Insufficient documentation

## 2013-05-13 DIAGNOSIS — R531 Weakness: Secondary | ICD-10-CM | POA: Diagnosis present

## 2013-05-13 DIAGNOSIS — G8190 Hemiplegia, unspecified affecting unspecified side: Secondary | ICD-10-CM

## 2013-05-13 DIAGNOSIS — R29898 Other symptoms and signs involving the musculoskeletal system: Secondary | ICD-10-CM | POA: Insufficient documentation

## 2013-05-13 HISTORY — DX: Anemia, unspecified: D64.9

## 2013-05-13 HISTORY — DX: Migraine, unspecified, not intractable, without status migrainosus: G43.909

## 2013-05-13 LAB — URINALYSIS, ROUTINE W REFLEX MICROSCOPIC
Bilirubin Urine: NEGATIVE
Glucose, UA: NEGATIVE mg/dL
Hgb urine dipstick: NEGATIVE
Ketones, ur: NEGATIVE mg/dL
Leukocytes, UA: NEGATIVE
Nitrite: NEGATIVE
Protein, ur: NEGATIVE mg/dL
Specific Gravity, Urine: 1.027 (ref 1.005–1.030)
Urobilinogen, UA: 1 mg/dL (ref 0.0–1.0)
pH: 6 (ref 5.0–8.0)

## 2013-05-13 LAB — CBC WITH DIFFERENTIAL/PLATELET
Basophils Absolute: 0 10*3/uL (ref 0.0–0.1)
Basophils Relative: 0 % (ref 0–1)
Eosinophils Absolute: 0.1 10*3/uL (ref 0.0–0.7)
Eosinophils Relative: 1 % (ref 0–5)
HCT: 41.4 % (ref 36.0–46.0)
Hemoglobin: 14.2 g/dL (ref 12.0–15.0)
Lymphocytes Relative: 34 % (ref 12–46)
Lymphs Abs: 3.3 10*3/uL (ref 0.7–4.0)
MCH: 29.1 pg (ref 26.0–34.0)
MCHC: 34.3 g/dL (ref 30.0–36.0)
MCV: 84.8 fL (ref 78.0–100.0)
Monocytes Absolute: 0.7 10*3/uL (ref 0.1–1.0)
Monocytes Relative: 7 % (ref 3–12)
Neutro Abs: 5.7 10*3/uL (ref 1.7–7.7)
Neutrophils Relative %: 58 % (ref 43–77)
Platelets: 314 10*3/uL (ref 150–400)
RBC: 4.88 MIL/uL (ref 3.87–5.11)
RDW: 13.6 % (ref 11.5–15.5)
WBC: 9.8 10*3/uL (ref 4.0–10.5)

## 2013-05-13 LAB — BASIC METABOLIC PANEL
BUN: 20 mg/dL (ref 6–23)
CO2: 27 mEq/L (ref 19–32)
Calcium: 10.1 mg/dL (ref 8.4–10.5)
Chloride: 96 mEq/L (ref 96–112)
Creatinine, Ser: 1.04 mg/dL (ref 0.50–1.10)
GFR calc Af Amer: 77 mL/min — ABNORMAL LOW (ref >90)
GFR calc non Af Amer: 67 mL/min — ABNORMAL LOW (ref >90)
Glucose, Bld: 127 mg/dL — ABNORMAL HIGH (ref 70–99)
Potassium: 3.5 mEq/L (ref 3.5–5.1)
Sodium: 135 mEq/L (ref 135–145)

## 2013-05-13 LAB — PREGNANCY, URINE: Preg Test, Ur: NEGATIVE

## 2013-05-13 LAB — TROPONIN I: Troponin I: <0.3 ng/mL (ref <0.30)

## 2013-05-13 MED ORDER — FENTANYL CITRATE 0.05 MG/ML IJ SOLN
50.0000 ug | INTRAMUSCULAR | Status: DC | PRN
  Administered 2013-05-13: 50 ug via INTRAVENOUS
  Filled 2013-05-13: qty 2

## 2013-05-13 MED ORDER — DIAZEPAM 5 MG PO TABS
5.0000 mg | ORAL_TABLET | Freq: Once | ORAL | Status: AC
  Administered 2013-05-13: 5 mg via ORAL
  Filled 2013-05-13: qty 1

## 2013-05-13 MED ORDER — SODIUM CHLORIDE 0.9 % IV SOLN
INTRAVENOUS | Status: DC
  Administered 2013-05-13 – 2013-05-14 (×2): via INTRAVENOUS

## 2013-05-13 NOTE — Consult Note (Signed)
Neurology Consultation Reason for Consult: Left sided weakness Referring Physician: Merlene Laughter  CC: Left sided weakness  History is obtained from: Patient   HPI: Audrey [name] is a 37 y.o. female who has been having some numbness in the left side as well as pain in her chest for 3 days. The pain got worse over the course of the day today, and she describes a burning sensation throughout her left side. This caused her to present to the emergency room. On arrival here, she was noted to develop weakness of her left side and complaining she couldn't move her left side. I was therefore consulted urgently with concern for stroke.  Last known well: Saturday 7/26 TPA given: No, outside of window    ROS: A 14 point ROS was performed and is negative except as noted in the HPI.  Past Medical History  Diagnosis Date  . Asthma   . Abortion history   . Migraine headache   . Anemia     Family History: No history similar  Social History: Tob: Never smoker  Exam: Current vital signs: BP 142/96  Pulse 92  Temp(Src) 97.4 F (36.3 C) (Oral)  Resp 23  SpO2 100%  LMP 05/10/2013 Vital signs in last 24 hours: Temp:  [97.4 F (36.3 C)] 97.4 F (36.3 C) (07/28 2111) Pulse Rate:  [92] 92 (07/28 2111) Resp:  [23] 23 (07/28 2111) BP: (142)/(96) 142/96 mmHg (07/28 2111) SpO2:  [100 %] 100 % (07/28 2111)  General: In bed, NAD CV: Regular rate and rhythm Mental Status: Patient is awake, alert, oriented to person, place, month, year, and situation. Immediate and remote memory are intact. Patient is able to give a clear and coherent history. Able to spell world backwards without difficulty No signs of aphasia or neglect Cranial Nerves: II: Visual Fields are full. Pupils are equal, round, and reactive to light.  Discs are sharp. III,IV, VI: EOMI without ptosis or diploplia.  V: Facial sensation is diminished on the left side, including to vibration. She splits the midline with pin VII:  Facial movement is symmetric.  VIII: hearing is intact to voice X: Uvula elevates symmetrically XI: Shoulder shrug is symmetric. XII: tongue is midline without atrophy or fasciculations.  Motor: Tone is normal. Bulk is normal. She initially will not move her left side, though with prompting she exhibits at least 4+/5 strength throughout left. She has give way weakness. Sensory: Sensation is diminished throughout the left side Deep Tendon Reflexes: 2+ and symmetric in the biceps and patellae.  Plantars: Toes are downgoing bilaterally.  Cerebellar: FNF are intact bilaterally, including on the left where she initially had severe weakness Gait: Not tested due to patient safety concern   I have reviewed labs in epic and the results pertinent to this consultation are: BMP - unremarkable CBC normal.   I have reviewed the images obtained: CT head-negative  Impression: 37 year old female with left-sided symptoms of weakness and dysesthesia. With her inconsistent exam and splitting the midline, I strongly suspect a psychogenic etiology. Given that she has had numbness for over 2 days, she would not be a candidate for TPA in any case. At this time, I encouraged her stating that I felt that she would get well quickly.  Recommendations: 1) if patient improves and has no further weakness, then I do not feel that further testing is necessary as I suspect psychogenic etiology 2) if there is persistent symptoms, then an MRI to rule out the possibility of an underlying  lesion with superimposed functional deficits would be prudent. 3) CP workup per ER.    Ritta Slot, MD [facility] [phone]  If 7pm- 7am, please page neurology on call at [phone].

## 2013-05-13 NOTE — ED Notes (Signed)
Pt arrived to the ED with a chief complaint of chest pain that was occuring all day that radiated to her back.  Pt states that pain is a burning sensation.  Pt also states she has numbness in her left extremities.  Pt is unable to lift her left leg on her own power.  Pt deflects hand from hitting her face when it is dropped from above her.  Pt has two small children with her.  ECG performed.  Pt able to swallow water with her medication without difficulty.

## 2013-05-13 NOTE — ED Notes (Signed)
Pt was able to ambulate to the restroom and return with assistance going and without assistance returning.

## 2013-05-13 NOTE — ED Provider Notes (Signed)
CSN: [id number]     Arrival date & time 05/13/13  2102 History     First MD Initiated Contact with Patient 05/13/13 2109     Chief Complaint  Patient presents with  . Chest Pain  . Extremity Weakness   HPI Pt was seen at 2110.  Per pt, c/o gradual onset and worsening of left upper chest wall "pain" that began this morning. Pt states the pain worsened throughout the day today. Pt states throughout the day today her left upper back area "started to hurt" also. Describes this pain as "burning." States several minutes PTA to the ED, she felt the entire front of her face "start to burn" and she now "can't move my left side." Denies palpitations, no SOB/cough, no abd pain, no N/V/D, no fevers, no neck or back pain.    Past Medical History  Diagnosis Date  . Asthma   . Abortion history   . Migraine headache   . Anemia    Past Surgical History  Procedure Laterality Date  . Cesarean section      History  Substance Use Topics  . Smoking status: Never Smoker   . Smokeless tobacco: Not on file  . Alcohol Use: No    Review of Systems ROS: Statement: All systems negative except as marked or noted in the HPI; Constitutional: Negative for fever and chills. ; ; Eyes: Negative for eye pain, redness and discharge. ; ; ENMT: Negative for ear pain, hoarseness, nasal congestion, sinus pressure and sore throat. ; ; Cardiovascular: +CP. Negative for palpitations, diaphoresis, dyspnea and peripheral edema. ; ; Respiratory: Negative for cough, wheezing and stridor. ; ; Gastrointestinal: Negative for nausea, vomiting, diarrhea, abdominal pain, blood in stool, hematemesis, jaundice and rectal bleeding. . ; ; Genitourinary: Negative for dysuria, flank pain and hematuria. ; ; Musculoskeletal: +back pain. Negative for neck pain. Negative for swelling and trauma.; ; Skin: Negative for pruritus, rash, abrasions, blisters, bruising and skin lesion.; ; Neuro: +extremity weakness, paresthesias. Negative for headache,  lightheadedness and neck stiffness. Negative for altered level of consciousness , altered mental status, involuntary movement, seizure and syncope.      Allergies  Advair diskus and Nyamyc  Home Medications   Current Outpatient Rx  Name  Route  Sig  Dispense  Refill  . albuterol (PROVENTIL HFA;VENTOLIN HFA) 108 (90 BASE) MCG/ACT inhaler   Inhalation   Inhale 2 puffs into the lungs 2 (two) times daily. For shortness of breath         . lisinopril-hydrochlorothiazide (PRINZIDE,ZESTORETIC) 10-12.5 MG per tablet   Oral   Take 1 tablet by mouth daily.          BP 142/96  Pulse 92  Temp(Src) 97.4 F (36.3 C) (Oral)  Resp 23  SpO2 100% Physical Exam 2115: Physical examination:  Nursing notes reviewed; Vital signs and O2 SAT reviewed;  Constitutional: Well developed, Well nourished, Well hydrated, In no acute distress; Head:  Normocephalic, atraumatic; Eyes: EOMI, PERRL, No scleral icterus; ENMT: Mouth and pharynx normal, Mucous membranes moist; Neck: Supple, Full range of motion, No lymphadenopathy; Cardiovascular: Regular rate and rhythm, No gallop; Respiratory: Breath sounds clear & equal bilaterally, No rales, rhonchi, wheezes.  Speaking full sentences with ease, Normal respiratory effort/excursion; Chest: +TTP left upper chest wall area. No rash, no soft tissue crepitus. Movement normal; Abdomen: Soft, Nontender, Nondistended, Normal bowel sounds; Genitourinary: No CVA tenderness; Spine:  No midline CS, TS, LS tenderness. +TTP left hypertonic trapezius, cervical and thoracic paraspinal muscles.;; Extremities:  Pulses normal, No tenderness, No edema, No calf edema or asymmetry.; Neuro: AA&Ox3, Major CN grossly intact.  Speech clear. No facial droop. Refuses to move LUE and LLE. 5/5 strength RUE and RLE.; Skin: Color normal, Warm, Dry.; Psych:  Affect flat, poor eye contact.    ED Course   2115:  On pt arrival, pt refused to move her LUE and LLE. Pt's left arm lifted up by myself and  held over her head as I palpated her back. Pt was able to hold her LUE up on her own then slowly flutter it downward to rest on her chest/abd area. At that time, pt moved her LLE on stretcher spontaneously, though refused to hold it in the air for me when I lifted it up off the mattress. Pt also refused to dorsiflex her foot and hold it for me.  Pt with hypertonic left trapezius muscle, as well as left cervical and thoracic paraspinal muscles. Will dose pain meds and muscle relaxer.   2130: Pt not TPA candidate due to rapidly improving deficits. T/C to Neuro Dr. Amada Jupiter, case discussed, including:  HPI, pertinent PM/SHx, VS/PE, dx testing, ED course and treatment:  States he will come to ED for eval.   2240:  Neuro Dr. Amada Jupiter has come to ED for eval: states if pt returns to baseline she can be d/c, otherwise if remains with LUE/LLE weakness will need medical admit for MRI brain in the morning. Pt attempted to ambulate to bathroom with ED staff, needed assist.  Neuro exam now with 4/5 strength LUE and LLE compared to right side, with left grip weaker than right.  T/C to [facility] Dr. Mahala Menghini, case discussed, including:  HPI, pertinent PM/SHx, VS/PE, dx testing, ED course and treatment:  Agreeable to come to ED for eval for observation admit.     Procedures    MDM  MDM Reviewed: previous chart, nursing note and vitals Reviewed previous: labs and ECG Interpretation: labs, ECG, x-ray, MRI and CT scan    Date: 05/13/2013  Rate: 83  Rhythm: normal sinus rhythm  QRS Axis: normal  Intervals: normal  ST/T Wave abnormalities: normal  Conduction Disutrbances:none  Narrative Interpretation:   Old EKG Reviewed: unchanged; no significant changes from previous EKG dated 12/27/2011.  Results for orders placed during the hospital encounter of 05/13/13  BASIC METABOLIC PANEL      Result Value Range   Sodium 135  135 - 145 mEq/L   Potassium 3.5  3.5 - 5.1 mEq/L   Chloride 96  96 - 112 mEq/L   CO2  27  19 - 32 mEq/L   Glucose, Bld 127 (*) 70 - 99 mg/dL   BUN 20  6 - 23 mg/dL   Creatinine, Ser 9.14  0.50 - 1.10 mg/dL   Calcium 78.2  8.4 - 95.6 mg/dL   GFR calc non Af Amer 67 (*) >90 mL/min   GFR calc Af Amer 77 (*) >90 mL/min  CBC WITH DIFFERENTIAL      Result Value Range   WBC 9.8  4.0 - 10.5 K/uL   RBC 4.88  3.87 - 5.11 MIL/uL   Hemoglobin 14.2  12.0 - 15.0 g/dL   HCT 21.3  08.6 - 57.8 %   MCV 84.8  78.0 - 100.0 fL   MCH 29.1  26.0 - 34.0 pg   MCHC 34.3  30.0 - 36.0 g/dL   RDW 46.9  62.9 - 52.8 %   Platelets 314  150 - 400 K/uL  Neutrophils Relative % 58  43 - 77 %   Neutro Abs 5.7  1.7 - 7.7 K/uL   Lymphocytes Relative 34  12 - 46 %   Lymphs Abs 3.3  0.7 - 4.0 K/uL   Monocytes Relative 7  3 - 12 %   Monocytes Absolute 0.7  0.1 - 1.0 K/uL   Eosinophils Relative 1  0 - 5 %   Eosinophils Absolute 0.1  0.0 - 0.7 K/uL   Basophils Relative 0  0 - 1 %   Basophils Absolute 0.0  0.0 - 0.1 K/uL  PREGNANCY, URINE      Result Value Range   Preg Test, Ur NEGATIVE  NEGATIVE  URINALYSIS, ROUTINE W REFLEX MICROSCOPIC      Result Value Range   Color, Urine YELLOW  YELLOW   APPearance CLEAR  CLEAR   Specific Gravity, Urine 1.027  1.005 - 1.030   pH 6.0  5.0 - 8.0   Glucose, UA NEGATIVE  NEGATIVE mg/dL   Hgb urine dipstick NEGATIVE  NEGATIVE   Bilirubin Urine NEGATIVE  NEGATIVE   Ketones, ur NEGATIVE  NEGATIVE mg/dL   Protein, ur NEGATIVE  NEGATIVE mg/dL   Urobilinogen, UA 1.0  0.0 - 1.0 mg/dL   Nitrite NEGATIVE  NEGATIVE   Leukocytes, UA NEGATIVE  NEGATIVE  TROPONIN I      Result Value Range   Troponin I <0.30  <0.30 ng/mL   Dg Chest 2 View 05/13/2013   *RADIOLOGY REPORT*  Clinical Data: Chest pain.  Left-sided weakness.  CHEST - 2 VIEW  Comparison: 10/11/2012  Findings: Heart and mediastinal contours are within normal limits. No focal opacities or effusions.  No acute bony abnormality. Calcified granuloma in the right lung base, stable.  IMPRESSION: No active cardiopulmonary  disease.   Original Report Authenticated By: Charlett Nose, M.D.   Ct Head Wo Contrast 05/13/2013   *RADIOLOGY REPORT*  Clinical Data:  Left-sided weakness.  Numbness in the extremities. Chest pain.  CT HEAD WITHOUT CONTRAST CT CERVICAL SPINE WITHOUT CONTRAST  Technique:  Multidetector CT imaging of the head and cervical spine was performed following the standard protocol without intravenous contrast.  Multiplanar CT image reconstructions of the cervical spine were also generated.  Comparison:   None  CT HEAD  Findings: No mass lesion, mass effect, midline shift, hydrocephalus, hemorrhage.  No territorial ischemia or acute infarction.  The paranasal sinuses appear within normal limits.  IMPRESSION: Negative CT head.  CT CERVICAL SPINE  Findings: Anatomic alignment.  Mild posterior osseous ridging is present at C6.  No significant stenosis.  No fracture or dislocation.  No subluxation.  Prevertebral soft tissues are normal. Occipital condyles intact.  Odontoid intact.  Central canal appears within normal limits.  IMPRESSION: Negative CT cervical spine.   Original Report Authenticated By: Andreas Newport, M.D.   Ct Cervical Spine Wo Contrast 05/13/2013   *RADIOLOGY REPORT*  Clinical Data:  Left-sided weakness.  Numbness in the extremities. Chest pain.  CT HEAD WITHOUT CONTRAST CT CERVICAL SPINE WITHOUT CONTRAST  Technique:  Multidetector CT imaging of the head and cervical spine was performed following the standard protocol without intravenous contrast.  Multiplanar CT image reconstructions of the cervical spine were also generated.  Comparison:   None  CT HEAD  Findings: No mass lesion, mass effect, midline shift, hydrocephalus, hemorrhage.  No territorial ischemia or acute infarction.  The paranasal sinuses appear within normal limits.  IMPRESSION: Negative CT head.  CT CERVICAL SPINE  Findings: Anatomic alignment.  Mild posterior osseous ridging is present at C6.  No significant stenosis.  No fracture or  dislocation.  No subluxation.  Prevertebral soft tissues are normal. Occipital condyles intact.  Odontoid intact.  Central canal appears within normal limits.  IMPRESSION: Negative CT cervical spine.   Original Report Authenticated By: Andreas Newport, M.D.           Laray Anger, DO 05/15/13 1643

## 2013-05-14 ENCOUNTER — Observation Stay (HOSPITAL_COMMUNITY): Payer: Managed Care, Other (non HMO)

## 2013-05-14 DIAGNOSIS — J45909 Unspecified asthma, uncomplicated: Secondary | ICD-10-CM

## 2013-05-14 DIAGNOSIS — J309 Allergic rhinitis, unspecified: Secondary | ICD-10-CM

## 2013-05-14 DIAGNOSIS — E559 Vitamin D deficiency, unspecified: Secondary | ICD-10-CM

## 2013-05-14 LAB — COMPREHENSIVE METABOLIC PANEL
ALT: 13 U/L (ref 0–35)
AST: 11 U/L (ref 0–37)
Albumin: 2.9 g/dL — ABNORMAL LOW (ref 3.5–5.2)
Alkaline Phosphatase: 55 U/L (ref 39–117)
BUN: 17 mg/dL (ref 6–23)
CO2: 27 mEq/L (ref 19–32)
Calcium: 9.1 mg/dL (ref 8.4–10.5)
Chloride: 101 mEq/L (ref 96–112)
Creatinine, Ser: 0.83 mg/dL (ref 0.50–1.10)
GFR calc Af Amer: >90 mL/min (ref >90)
GFR calc non Af Amer: 88 mL/min — ABNORMAL LOW (ref >90)
Glucose, Bld: 116 mg/dL — ABNORMAL HIGH (ref 70–99)
Potassium: 3.4 mEq/L — ABNORMAL LOW (ref 3.5–5.1)
Sodium: 135 mEq/L (ref 135–145)
Total Bilirubin: 0.4 mg/dL (ref 0.3–1.2)
Total Protein: 6.5 g/dL (ref 6.0–8.3)

## 2013-05-14 LAB — CBC
HCT: 35.8 % — ABNORMAL LOW (ref 36.0–46.0)
Hemoglobin: 11.7 g/dL — ABNORMAL LOW (ref 12.0–15.0)
MCH: 28.5 pg (ref 26.0–34.0)
MCHC: 33 g/dL (ref 30.0–36.0)
MCV: 86.5 fL (ref 78.0–100.0)
Platelets: 264 10*3/uL (ref 150–400)
RBC: 4.14 MIL/uL (ref 3.87–5.11)
RDW: 13.8 % (ref 11.5–15.5)
WBC: 7.9 10*3/uL (ref 4.0–10.5)

## 2013-05-14 LAB — GLUCOSE, CAPILLARY: Glucose-Capillary: 102 mg/dL — ABNORMAL HIGH (ref 70–99)

## 2013-05-14 LAB — PROTIME-INR
INR: 1.06 (ref 0.00–1.49)
Prothrombin Time: 13.6 seconds (ref 11.6–15.2)

## 2013-05-14 MED ORDER — ALBUTEROL SULFATE HFA 108 (90 BASE) MCG/ACT IN AERS
2.0000 | INHALATION_SPRAY | Freq: Two times a day (BID) | RESPIRATORY_TRACT | Status: DC
  Filled 2013-05-14 (×2): qty 6.7

## 2013-05-14 MED ORDER — OXYCODONE-ACETAMINOPHEN 7.5-325 MG PO TABS: 1.0000 | ORAL_TABLET | ORAL | Status: DC | PRN

## 2013-05-14 MED ORDER — ALBUTEROL SULFATE HFA 108 (90 BASE) MCG/ACT IN AERS
2.0000 | INHALATION_SPRAY | Freq: Two times a day (BID) | RESPIRATORY_TRACT | Status: DC
  Filled 2013-05-14: qty 6.7

## 2013-05-14 MED ORDER — POTASSIUM CHLORIDE CRYS ER 20 MEQ PO TBCR
40.0000 meq | EXTENDED_RELEASE_TABLET | Freq: Once | ORAL | Status: AC
  Administered 2013-05-14: 40 meq via ORAL
  Filled 2013-05-14: qty 2

## 2013-05-14 NOTE — Progress Notes (Signed)
05/14/13 1420 Reviewed discharge instructions with patient. Patient verbalized understanding of discharge. Patient was given copy of discharge papers, prescription, and note for work. Patient will follow up and schedule an appointment with a dentist.

## 2013-05-14 NOTE — H&P (Signed)
[facility] History and Physical  Audrey [name] WUJ:[id number] DOB: [birth year] DOA: 05/13/2013  Referring physician: [facility] PCP: Gwynneth Aliment, MD  Specialists: NEURO  Chief Complaint: Weakness, chest pain  HPI: Audrey [name] is a 37 y.o. female with history of asthma, migraines, prior abortion, and apparent workup in the remote past for multiple sclerosis, which was negative, who presented to the emergency room today with some vague chest pain, starting on the left side and radiating to the middle of back. This did not history she still went her chest is present. She also states that the pain worsened when she bent herself in half. She reported to the ED physician that she cannot relax and had myalgias and tingling on the entire left side of the body. Left face left arm left leg. Per report. Her son stated that her entire left sole was black and blue. Workup in the ED revealed negative. Urinalysis negative negative cervical, CT spine, negative CT head.  Chest x-ray = no cardiopulmonary disease Labs were entirely normal, troponin less than 0.30, glucose 1 Neurology was consulted, given concerns for the weakness on one side body, and felt that an MRI should be performed-unfortunately, MRI is not available at this time as they have gone home for the night.  Review of Systems: The patient denies chills, fever, nausea, vomiting, chest, and dark stool, tarry stool  Past Medical History  Diagnosis Date  . Asthma   . Abortion history   . Migraine headache   . Anemia    Past Surgical History  Procedure Laterality Date  . Cesarean section     Social History:  reports that she has never smoked. She does not have any smokeless tobacco history on file. She reports that she does not drink alcohol or use illicit drugs.   Allergies  Allergen Reactions  . Advair Diskus (Fluticasone-Salmeterol) Nausea Only    Weakness headaches  . Nyamyc (Nystatin) Other (See Comments)    Made eye  infection worse   History reviewed. No pertinent family history.   Prior to Admission medications   Medication Sig Start Date End Date Taking? Authorizing Provider  albuterol (PROVENTIL HFA;VENTOLIN HFA) 108 (90 BASE) MCG/ACT inhaler Inhale 2 puffs into the lungs 2 (two) times daily. For shortness of breath   Yes Historical Provider, MD  lisinopril-hydrochlorothiazide (PRINZIDE,ZESTORETIC) 10-12.5 MG per tablet Take 1 tablet by mouth daily.   Yes Historical Provider, MD   Physical Exam: Filed Vitals:   05/13/13 2111  BP: 142/96  Pulse: 92  Temp: 97.4 F (36.3 C)  TempSrc: Oral  Resp: 23  SpO2: 100%     General:  EOMI NCAT   Eyes: No pallor no icterus   ENT: Clear   Neck: Soft supple. No thyromegaly no submandibular lymphadenopathy   Cardiovascular: S1, S2 no murmur, rub, or gallop   Respiratory: Clinically clear   Abdomen: Soft, nontender, nondistended   Skin: No lower extremity edema   Musculoskeletal: Range of motion intact   Psychiatric: Flat affect   Neurologic: Grossly intact moving all 4 limbs equally however, left side, upper and lower extremities are weaker than right. She has some point tenderness to the bicipital groove on the left side, and she claims she has numbness on the left side. She is however able to walk 14 feet without  Issues.  She states also that it feels more like a numbness in the whole left side of the body Labs on Admission:  Basic Metabolic Panel:  Recent Labs  Lab 05/13/13 2120  NA 135  K 3.5  CL 96  CO2 27  GLUCOSE 127*  BUN 20  CREATININE 1.04  CALCIUM 10.1   Liver Function Tests: No results found for this basename: AST, ALT, ALKPHOS, BILITOT, PROT, ALBUMIN,  in the last 168 hours No results found for this basename: LIPASE, AMYLASE,  in the last 168 hours No results found for this basename: AMMONIA,  in the last 168 hours CBC:  Recent Labs Lab 05/13/13 2120  WBC 9.8  NEUTROABS 5.7  HGB 14.2  HCT 41.4  MCV 84.8  PLT  314   Cardiac Enzymes:  Recent Labs Lab 05/13/13 2120  TROPONINI <0.30    BNP (last 3 results) No results found for this basename: PROBNP,  in the last 8760 hours CBG: No results found for this basename: GLUCAP,  in the last 168 hours  Radiological Exams on Admission: Dg Chest 2 View  05/13/2013   *RADIOLOGY REPORT*  Clinical Data: Chest pain.  Left-sided weakness.  CHEST - 2 VIEW  Comparison: 10/11/2012  Findings: Heart and mediastinal contours are within normal limits. No focal opacities or effusions.  No acute bony abnormality. Calcified granuloma in the right lung base, stable.  IMPRESSION: No active cardiopulmonary disease.   Original Report Authenticated By: Charlett Nose, M.D.   Ct Head Wo Contrast  05/13/2013   *RADIOLOGY REPORT*  Clinical Data:  Left-sided weakness.  Numbness in the extremities. Chest pain.  CT HEAD WITHOUT CONTRAST CT CERVICAL SPINE WITHOUT CONTRAST  Technique:  Multidetector CT imaging of the head and cervical spine was performed following the standard protocol without intravenous contrast.  Multiplanar CT image reconstructions of the cervical spine were also generated.  Comparison:   None  CT HEAD  Findings: No mass lesion, mass effect, midline shift, hydrocephalus, hemorrhage.  No territorial ischemia or acute infarction.  The paranasal sinuses appear within normal limits.  IMPRESSION: Negative CT head.  CT CERVICAL SPINE  Findings: Anatomic alignment.  Mild posterior osseous ridging is present at C6.  No significant stenosis.  No fracture or dislocation.  No subluxation.  Prevertebral soft tissues are normal. Occipital condyles intact.  Odontoid intact.  Central canal appears within normal limits.  IMPRESSION: Negative CT cervical spine.   Original Report Authenticated By: Andreas Newport, M.D.   Ct Cervical Spine Wo Contrast  05/13/2013   *RADIOLOGY REPORT*  Clinical Data:  Left-sided weakness.  Numbness in the extremities. Chest pain.  CT HEAD WITHOUT CONTRAST CT  CERVICAL SPINE WITHOUT CONTRAST  Technique:  Multidetector CT imaging of the head and cervical spine was performed following the standard protocol without intravenous contrast.  Multiplanar CT image reconstructions of the cervical spine were also generated.  Comparison:   None  CT HEAD  Findings: No mass lesion, mass effect, midline shift, hydrocephalus, hemorrhage.  No territorial ischemia or acute infarction.  The paranasal sinuses appear within normal limits.  IMPRESSION: Negative CT head.  CT CERVICAL SPINE  Findings: Anatomic alignment.  Mild posterior osseous ridging is present at C6.  No significant stenosis.  No fracture or dislocation.  No subluxation.  Prevertebral soft tissues are normal. Occipital condyles intact.  Odontoid intact.  Central canal appears within normal limits.  IMPRESSION: Negative CT cervical spine.   Original Report Authenticated By: Andreas Newport, M.D.    EKG: Independently reviewed. None performed  Assessment/Plan Active Problems:   Acute left-sided weakness   1. Left-sided weakness-unlikely organic pathology. This is already improving. I think that this is potentially  a migrainous equivalent-she mentions a couple of days history of these findings-she has had an extensive workup in the past for potential MS, about 3 years ago, which are not available to me. Allergies to both Advair and lisinopril-HCTZ, and I am not aware of any neurological component to those allergies [she was started on these 2 medications just this past week.] I do agree that the patient would benefit from MRI of the brain. However, in the interest of patient placement and ED throughput, Dr. Clarene Duke stated that it would be better for her to be admitted.  If the MRI comes back negative she can potentially be discharged in the morning. 2. Asthma continue albuterol inhaler only  Neurology consulted by emergency room  Full code No family at bedside obs, MedSurg  Time spent: 66  Mahala Menghini  [facility] Pager [phone]  If 7PM-7AM, please contact night-coverage [email or web address] Password [email or web address] 05/14/2013, 12:23 AM

## 2013-05-14 NOTE — Care Management Note (Signed)
    Page 1 of 1   05/14/2013     12:46:47 PM   CARE MANAGEMENT NOTE 05/14/2013  Patient:  Audrey [name], Audrey [name]   Account Number:  [id number]  Date Initiated:  05/14/2013  Documentation initiated by:  Lanier Clam  Subjective/Objective Assessment:   ADMITTED W/L SIDE WEAKNESS.     Action/Plan:   FROM HOME.HAS PCP,PHARMACY.   Anticipated DC Date:  05/14/2013   Anticipated DC Plan:  HOME/SELF CARE      DC Planning Services  CM consult      Choice offered to / List presented to:             Status of service:  Completed, signed off Medicare Important Message given?   (If response is "NO", the following Medicare IM given date fields will be blank) Date Medicare IM given:   Date Additional Medicare IM given:    Discharge Disposition:  HOME/SELF CARE  Per UR Regulation:  Reviewed for med. necessity/level of care/duration of stay  If discussed at Long Length of Stay Meetings, dates discussed:    Comments:  05/14/13 Chrishawn Kring RN,BSN NCM [phone] D/C HOME NO ORDERS OR NEEDS.

## 2013-05-14 NOTE — Discharge Summary (Signed)
Physician Discharge Summary  Audrey [name] AVW:[id number] DOB: [birth year] DOA: 05/13/2013  PCP: Gwynneth Aliment, MD  Admit date: 05/13/2013 Discharge date: 05/14/2013  Recommendations for Outpatient Follow-up:   1. potassium has been repleted prior to discharge  2. CAT scan and MRI of the brain are negative  3. please followup with primary care physician in one week after the discharge. Patient will schedule her own appointment.  4. please recheck hemoglobin with your next visit to primary care provider. Hemoglobin at the time of discharge is 11.7. There is no sign of active bleed. Patient did not require blood transfusion throughout his hospital stay.   Discharge Diagnoses:  Active Problems:   Acute left-sided weakness    Discharge Condition: Medically stable for discharge home today  Diet recommendation: As tolerated  History of present illness:  37 y.o. female with past medical history of asthma, migraine headaches and hypertension who presented to [facility] ED 05/13/2013 with intermittent, vague chest pain started on the left side and radiating to the middle of the back. Patient also reported associated left-sided weakness. The CAT scan of the head on the admission was negative. Further evaluation included MRI of the brain which was negative as well.  Assessment and plan:  Principal problem: Chest pain - Chest pain resolved at this time - Cardiac enzymes are negative since the admission  Active problems: Left-sided weakness - CT head and negative, MRI brain negative - Weakness resolved Hypertension - BP 103/54 - Continue Prinizide   Discharge Exam: Filed Vitals:   05/14/13 0547  BP: 103/54  Pulse: 80  Temp: 98.2 F (36.8 C)  Resp: 18   Filed Vitals:   05/13/13 2111 05/14/13 0106 05/14/13 0547  BP: 142/96 125/71 103/54  Pulse: 92 67 80  Temp: 97.4 F (36.3 C) 97.5 F (36.4 C) 98.2 F (36.8 C)  TempSrc: Oral Oral Oral  Resp: 23 20 18   Height:  5' (1.524 m)    Weight:  86.728 kg (191 lb 3.2 oz)   SpO2: 100% 99% 100%    General: Pt is alert, follows commands appropriately, not in acute distress Cardiovascular: Regular rate and rhythm, S1/S2 +, no murmurs, no rubs, no gallops Respiratory: Clear to auscultation bilaterally, no wheezing, no crackles, no rhonchi Abdominal: Soft, non tender, non distended, bowel sounds +, no guarding Extremities: no edema, no cyanosis, pulses palpable bilaterally DP and PT Neuro: Grossly nonfocal  Discharge Instructions  Discharge Orders   Future Orders Complete By Expires     Call MD for:  difficulty breathing, headache or visual disturbances  As directed     Call MD for:  persistant dizziness or light-headedness  As directed     Call MD for:  persistant nausea and vomiting  As directed     Call MD for:  severe uncontrolled pain  As directed     Diet - low sodium heart healthy  As directed     Discharge instructions  As directed     Comments:      1. potassium has been repleted prior to discharge 2. CAT scan and MRI of the brain are negative 3. please followup with primary care physician in one week after the discharge. Patient will schedule her own appointment. 4. please recheck hemoglobin with your next visit to primary care provider. Hemoglobin at the time of discharge is 11.7. There is no sign of active bleed. Patient did not require blood transfusion throughout his hospital stay.    Increase activity slowly  As  directed         Medication List         albuterol 108 (90 BASE) MCG/ACT inhaler  Commonly known as:  PROVENTIL HFA;VENTOLIN HFA  Inhale 2 puffs into the lungs 2 (two) times daily. For shortness of breath     lisinopril-hydrochlorothiazide 10-12.5 MG per tablet  Commonly known as:  PRINZIDE,ZESTORETIC  Take 1 tablet by mouth daily.     oxyCODONE-acetaminophen 7.5-325 MG per tablet  Commonly known as:  PERCOCET  Take 1 tablet by mouth every 4 (four) hours as needed for pain.            Follow-up Information   Follow up with Gwynneth Aliment, MD In 1 week.   Contact information:   [address] [place] [address] [phone]        The results of significant diagnostics from this hospitalization (including imaging, microbiology, ancillary and laboratory) are listed below for reference.    Significant Diagnostic Studies: Dg Chest 2 View  05/13/2013   *RADIOLOGY REPORT*  Clinical Data: Chest pain.  Left-sided weakness.  CHEST - 2 VIEW  Comparison: 10/11/2012  Findings: Heart and mediastinal contours are within normal limits. No focal opacities or effusions.  No acute bony abnormality. Calcified granuloma in the right lung base, stable.  IMPRESSION: No active cardiopulmonary disease.   Original Report Authenticated By: Charlett Nose, M.D.   Ct Head Wo Contrast  05/13/2013   *RADIOLOGY REPORT*  Clinical Data:  Left-sided weakness.  Numbness in the extremities. Chest pain.  CT HEAD WITHOUT CONTRAST CT CERVICAL SPINE WITHOUT CONTRAST  Technique:  Multidetector CT imaging of the head and cervical spine was performed following the standard protocol without intravenous contrast.  Multiplanar CT image reconstructions of the cervical spine were also generated.  Comparison:   None  CT HEAD  Findings: No mass lesion, mass effect, midline shift, hydrocephalus, hemorrhage.  No territorial ischemia or acute infarction.  The paranasal sinuses appear within normal limits.  IMPRESSION: Negative CT head.  CT CERVICAL SPINE  Findings: Anatomic alignment.  Mild posterior osseous ridging is present at C6.  No significant stenosis.  No fracture or dislocation.  No subluxation.  Prevertebral soft tissues are normal. Occipital condyles intact.  Odontoid intact.  Central canal appears within normal limits.  IMPRESSION: Negative CT cervical spine.   Original Report Authenticated By: Andreas Newport, M.D.   Ct Cervical Spine Wo Contrast  05/13/2013   *RADIOLOGY REPORT*  Clinical Data:   Left-sided weakness.  Numbness in the extremities. Chest pain.  CT HEAD WITHOUT CONTRAST CT CERVICAL SPINE WITHOUT CONTRAST  Technique:  Multidetector CT imaging of the head and cervical spine was performed following the standard protocol without intravenous contrast.  Multiplanar CT image reconstructions of the cervical spine were also generated.  Comparison:   None  CT HEAD  Findings: No mass lesion, mass effect, midline shift, hydrocephalus, hemorrhage.  No territorial ischemia or acute infarction.  The paranasal sinuses appear within normal limits.  IMPRESSION: Negative CT head.  CT CERVICAL SPINE  Findings: Anatomic alignment.  Mild posterior osseous ridging is present at C6.  No significant stenosis.  No fracture or dislocation.  No subluxation.  Prevertebral soft tissues are normal. Occipital condyles intact.  Odontoid intact.  Central canal appears within normal limits.  IMPRESSION: Negative CT cervical spine.   Original Report Authenticated By: Andreas Newport, M.D.   Mr Brain Wo Contrast  05/14/2013   *RADIOLOGY REPORT*  Clinical Data: Left-sided weakness.  Chest  pain.  Tingling.  MRI HEAD WITHOUT CONTRAST  Technique:  Multiplanar, multiecho pulse sequences of the brain and surrounding structures were obtained according to standard protocol without intravenous contrast.  Comparison: CT orbits 01/12/2011.  Findings: The pituitary gland is mildly prominent, but within normal limits for age and gender.  The diffusion weighted images demonstrate no evidence for acute or subacute infarct.  No significant white matter disease is present.  The ventricles are of normal size.  No significant extra-axial fluid collection is present.  Flow is present in the major intracranial arteries.  The globes and orbits are intact.  The paranasal sinuses and mastoid air cells are clear.  IMPRESSION: Negative MRI of the brain.   Original Report Authenticated By: Marin Roberts, M.D.    Microbiology: No results found  for this or any previous visit (from the past 240 hour(s)).   Labs: Basic Metabolic Panel:  Recent Labs Lab 05/13/13 2120 05/14/13 0355  NA 135 135  K 3.5 3.4*  CL 96 101  CO2 27 27  GLUCOSE 127* 116*  BUN 20 17  CREATININE 1.04 0.83  CALCIUM 10.1 9.1   Liver Function Tests:  Recent Labs Lab 05/14/13 0355  AST 11  ALT 13  ALKPHOS 55  BILITOT 0.4  PROT 6.5  ALBUMIN 2.9*   No results found for this basename: LIPASE, AMYLASE,  in the last 168 hours No results found for this basename: AMMONIA,  in the last 168 hours CBC:  Recent Labs Lab 05/13/13 2120 05/14/13 0355  WBC 9.8 7.9  NEUTROABS 5.7  --   HGB 14.2 11.7*  HCT 41.4 35.8*  MCV 84.8 86.5  PLT 314 264   Cardiac Enzymes:  Recent Labs Lab 05/13/13 2120  TROPONINI <0.30   BNP: BNP (last 3 results) No results found for this basename: PROBNP,  in the last 8760 hours CBG:  Recent Labs Lab 05/14/13 0758  GLUCAP 102*    Time coordinating discharge: Over 30 minutes  Signed:  Manson Passey, MD  [facility]  05/14/2013, 11:07 AM  Pager #: [phone]

## 2013-12-24 ENCOUNTER — Emergency Department (INDEPENDENT_AMBULATORY_CARE_PROVIDER_SITE_OTHER)
Admission: EM | Admit: 2013-12-24 | Discharge: 2013-12-24 | Disposition: A | Discharge status: Home / Self Care | Payer: Managed Care, Other (non HMO) | Source: Home / Self Care | Attending: Emergency Medicine | Admitting: Emergency Medicine

## 2013-12-24 ENCOUNTER — Encounter (HOSPITAL_COMMUNITY): Payer: Self-pay | Admitting: Emergency Medicine

## 2013-12-24 DIAGNOSIS — T465X5A Adverse effect of other antihypertensive drugs, initial encounter: Secondary | ICD-10-CM

## 2013-12-24 DIAGNOSIS — T464X5A Adverse effect of angiotensin-converting-enzyme inhibitors, initial encounter: Secondary | ICD-10-CM

## 2013-12-24 DIAGNOSIS — T783XXA Angioneurotic edema, initial encounter: Secondary | ICD-10-CM

## 2013-12-24 MED ORDER — FAMOTIDINE 20 MG PO TABS: 20.0000 mg | ORAL_TABLET | Freq: Once | ORAL | Status: DC

## 2013-12-24 MED ORDER — AMLODIPINE BESYLATE 5 MG PO TABS: 5.0000 mg | ORAL_TABLET | Freq: Every day | ORAL | Status: DC

## 2013-12-24 MED ORDER — METHYLPREDNISOLONE SODIUM SUCC 125 MG IJ SOLR
125.0000 mg | Freq: Once | INTRAMUSCULAR | Status: AC
  Administered 2013-12-24: 125 mg via INTRAVENOUS

## 2013-12-24 MED ORDER — DIPHENHYDRAMINE HCL 25 MG PO CAPS
25.0000 mg | ORAL_CAPSULE | Freq: Once | ORAL | Status: AC
  Administered 2013-12-24: 25 mg via ORAL

## 2013-12-24 MED ORDER — METHYLPREDNISOLONE SODIUM SUCC 125 MG IJ SOLR
INTRAMUSCULAR | Status: AC
  Filled 2013-12-24: qty 2

## 2013-12-24 MED ORDER — DIPHENHYDRAMINE HCL 25 MG PO CAPS
ORAL_CAPSULE | ORAL | Status: AC
  Filled 2013-12-24: qty 1

## 2013-12-24 NOTE — Discharge Instructions (Signed)
Angioedema Angioedema is a sudden swelling of tissues, often of the skin. It can occur on the face or genitals or in the abdomen or other body parts. The swelling usually develops over a short period and gets better in 24 to 48 hours. It often begins during the night and is found when the person wakes up. The person may also get red, itchy patches of skin (hives). Angioedema can be dangerous if it involves swelling of the air passages.  Depending on the cause, episodes of angioedema may only happen once, come back in unpredictable patterns, or repeat for several years and then gradually fade away.  CAUSES  Angioedema can be caused by an allergic reaction to various triggers. It can also result from nonallergic causes, including reactions to drugs, immune system disorders, viral infections, or an abnormal gene that is passed to you from your parents (hereditary). For some people with angioedema, the cause is unknown.  Some things that can trigger angioedema include:   Foods.   Medicines, such as ACE inhibitors, ARBs, nonsteroidal anti-inflammatory agents, or estrogen.   Latex.   Animal saliva.   Insect stings.   Dyes used in X-rays.   Mild injury.   Dental work.  Surgery.  Stress.   Sudden changes in temperature.   Exercise. SIGNS AND SYMPTOMS   Swelling of the skin.  Hives. If these are present, there is also intense itching.  Redness in the affected area.   Pain in the affected area.  Swollen lips or tongue.  Breathing problems. This may happen if the air passages swell.  Wheezing. If internal organs are involved, there may be:   Nausea.   Abdominal pain.   Vomiting.   Difficulty swallowing.   Difficulty passing urine. DIAGNOSIS   Your health care provider will examine the affected area and take a medical and family history.  Various tests may be done to help determine the cause. Tests may include:  Allergy skin tests to see if the problem  is an allergic reaction.   Blood tests to check for hereditary angioedema.   Tests to check for underlying diseases that could cause the condition.   A review of your medicines, including over the counter medicines, may be done. TREATMENT  Treatment will depend on the cause of the angioedema. Possible treatments include:   Removal of anything that triggered the condition (such as stopping certain medicines).   Medicines to treat symptoms or prevent attacks. Medicines given may include:   Antihistamines.   Epinephrine injection.   Steroids.   Hospitalization may be required for severe attacks. If the air passages are affected, it can be an emergency. Tubes may need to be placed to keep the airway open. HOME CARE INSTRUCTIONS   Only take over-the-counter or prescription medicines as directed by your health care provider.  If you were given medicines for emergency allergy treatment, always carry them with you.  Wear a medical bracelet as directed by your health care provider.   Avoid known triggers. SEEK MEDICAL CARE IF:   You have repeat attacks of angioedema.   Your attacks are more frequent or more severe despite preventive measures.   You have hereditary angioedema and are considering having children. It is important to discuss the risks of passing the condition on to your children with your health care provider. SEEK IMMEDIATE MEDICAL CARE IF:   You have severe swelling of the mouth, tongue, or lips.  You have difficulty breathing.   You have difficulty swallowing.  You faint. MAKE SURE YOU:  Understand these instructions.  Will watch your condition.  Will get help right away if you are not doing well or get worse. Document Released: 12/12/2001 Document Revised: 07/24/2013 Document Reviewed: 05/27/2013 [facility] Patient Information 2014 [place], [place].  Please discontinue taking your lisinopril and begin taking Norvasc as ordered. Please notify  your primary care doctor of the medication changes.

## 2013-12-24 NOTE — ED Provider Notes (Signed)
CSN: [id number]     Arrival date & time 12/24/13  1014 History   First MD Initiated Contact with Patient 12/24/13 1123     Chief Complaint  Patient presents with  . Oral Swelling   (Consider location/radiation/quality/duration/timing/severity/associated sxs/prior Treatment) HPI Comments: Patient states she began experiencing tingling and swelling of her left lower lip last night. Symptoms have persisted today and now lip itches and has a bit of a burning sensation. Denies any swelling of tongue or difficulty with speaking, breathing or swallowing. No previous episodes. Did begin taking lisinopril 1 year ago. No fever or reported injury.   The history is provided by the patient.    Past Medical History  Diagnosis Date  . Asthma   . Abortion history   . Migraine headache   . Anemia    Past Surgical History  Procedure Laterality Date  . Cesarean section     History reviewed. No pertinent family history. History  Substance Use Topics  . Smoking status: Never Smoker   . Smokeless tobacco: Not on file  . Alcohol Use: No   OB History   Grav Para Term Preterm Abortions TAB SAB Ect Mult Living                 Review of Systems  All other systems reviewed and are negative.    Allergies  Advair diskus and Nyamyc  Home Medications   Current Outpatient Rx  Name  Route  Sig  Dispense  Refill  . albuterol (PROVENTIL HFA;VENTOLIN HFA) 108 (90 BASE) MCG/ACT inhaler   Inhalation   Inhale 2 puffs into the lungs 2 (two) times daily. For shortness of breath         . amLODipine (NORVASC) 5 MG tablet   Oral   Take 1 tablet (5 mg total) by mouth daily.   30 tablet   0   . lisinopril-hydrochlorothiazide (PRINZIDE,ZESTORETIC) 10-12.5 MG per tablet   Oral   Take 1 tablet by mouth daily.         Marland Kitchen oxyCODONE-acetaminophen (PERCOCET) 7.5-325 MG per tablet   Oral   Take 1 tablet by mouth every 4 (four) hours as needed for pain.   45 tablet   0    BP 133/74  Pulse 72   Temp(Src) 98.1 F (36.7 C) (Oral)  Resp 16  SpO2 100%  LMP 12/04/2013 Physical Exam  Nursing note and vitals reviewed. Constitutional: She is oriented to person, place, and time. She appears well-developed and well-nourished. No distress.  HENT:  Head: Normocephalic and atraumatic.  Right Ear: External ear normal.  Left Ear: External ear normal.  Nose: Nose normal.  Mouth/Throat: Uvula is midline, oropharynx is clear and moist and mucous membranes are normal. No trismus in the jaw. No uvula swelling.  Isolated edema/swelling of left lateral lower lip. No additional skin changes.  Eyes: Conjunctivae are normal.  Neck: Normal range of motion. Neck supple. No thyromegaly present.  Cardiovascular: Normal rate, regular rhythm and normal heart sounds.   Pulmonary/Chest: Effort normal and breath sounds normal. Stridor present. No respiratory distress. She has no wheezes.  Abdominal: Soft. She exhibits no distension. There is no tenderness.  Musculoskeletal: Normal range of motion.  Lymphadenopathy:    She has no cervical adenopathy.  Neurological: She is alert and oriented to person, place, and time.  Skin: Skin is warm and dry. No rash noted.  Psychiatric: She has a normal mood and affect. Her behavior is normal.    ED Course  Procedures (including critical care time) Labs Review Labs Reviewed - No data to display Imaging Review No results found.   MDM   1. Angioedema   2. ACE inhibitor-aggravated angioedema    Left lower lip angioedema likely a result of lisinopril. Patient has not yet taken her dose for today. Advised to discontinue this medication and begin Norvasc as prescribed. Given dose of Solu-medrol in [facility] and precautions for immediate evaluation. Also advised to contact her PCP regarding medication change.    [place], [place] 12/24/13 1215

## 2013-12-24 NOTE — ED Notes (Signed)
C/o left side lip swelling since last night States lip does itch and has burning sensation States area started as a small blister Did ice the area as treatment

## 2013-12-24 NOTE — ED Provider Notes (Signed)
Medical screening examination/treatment/procedure(s) were performed by non-physician practitioner and as supervising physician I was immediately available for consultation/collaboration.  Philipp Deputy, M.D.  Harden Mo, MD 12/24/13 [phone]

## 2014-01-29 ENCOUNTER — Emergency Department (HOSPITAL_BASED_OUTPATIENT_CLINIC_OR_DEPARTMENT_OTHER)
Admission: EM | Admit: 2014-01-29 | Discharge: 2014-01-29 | Disposition: A | Discharge status: Home / Self Care | Payer: Managed Care, Other (non HMO) | Attending: Emergency Medicine | Admitting: Emergency Medicine

## 2014-01-29 ENCOUNTER — Encounter (HOSPITAL_BASED_OUTPATIENT_CLINIC_OR_DEPARTMENT_OTHER): Payer: Self-pay | Admitting: Emergency Medicine

## 2014-01-29 DIAGNOSIS — R110 Nausea: Secondary | ICD-10-CM | POA: Insufficient documentation

## 2014-01-29 DIAGNOSIS — K089 Disorder of teeth and supporting structures, unspecified: Secondary | ICD-10-CM | POA: Insufficient documentation

## 2014-01-29 DIAGNOSIS — R5383 Other fatigue: Secondary | ICD-10-CM

## 2014-01-29 DIAGNOSIS — R55 Syncope and collapse: Secondary | ICD-10-CM | POA: Insufficient documentation

## 2014-01-29 DIAGNOSIS — Z3202 Encounter for pregnancy test, result negative: Secondary | ICD-10-CM | POA: Insufficient documentation

## 2014-01-29 DIAGNOSIS — K0889 Other specified disorders of teeth and supporting structures: Secondary | ICD-10-CM

## 2014-01-29 DIAGNOSIS — J45909 Unspecified asthma, uncomplicated: Secondary | ICD-10-CM | POA: Insufficient documentation

## 2014-01-29 DIAGNOSIS — Z862 Personal history of diseases of the blood and blood-forming organs and certain disorders involving the immune mechanism: Secondary | ICD-10-CM | POA: Insufficient documentation

## 2014-01-29 DIAGNOSIS — Z79899 Other long term (current) drug therapy: Secondary | ICD-10-CM | POA: Insufficient documentation

## 2014-01-29 DIAGNOSIS — I10 Essential (primary) hypertension: Secondary | ICD-10-CM | POA: Insufficient documentation

## 2014-01-29 DIAGNOSIS — Z8742 Personal history of other diseases of the female genital tract: Secondary | ICD-10-CM | POA: Insufficient documentation

## 2014-01-29 DIAGNOSIS — R5381 Other malaise: Secondary | ICD-10-CM | POA: Insufficient documentation

## 2014-01-29 DIAGNOSIS — R51 Headache: Secondary | ICD-10-CM | POA: Insufficient documentation

## 2014-01-29 HISTORY — DX: Essential (primary) hypertension: I10

## 2014-01-29 HISTORY — DX: Benign neoplasm of connective and other soft tissue, unspecified: D21.9

## 2014-01-29 LAB — URINALYSIS, ROUTINE W REFLEX MICROSCOPIC
Bilirubin Urine: NEGATIVE
Glucose, UA: NEGATIVE mg/dL
Hgb urine dipstick: NEGATIVE
Ketones, ur: NEGATIVE mg/dL
Leukocytes, UA: NEGATIVE
Nitrite: NEGATIVE
Protein, ur: NEGATIVE mg/dL
Specific Gravity, Urine: 1.02 (ref 1.005–1.030)
Urobilinogen, UA: 0.2 mg/dL (ref 0.0–1.0)
pH: 7.5 (ref 5.0–8.0)

## 2014-01-29 LAB — CBC WITH DIFFERENTIAL/PLATELET
Basophils Absolute: 0 10*3/uL (ref 0.0–0.1)
Basophils Relative: 0 % (ref 0–1)
Eosinophils Absolute: 0.1 10*3/uL (ref 0.0–0.7)
Eosinophils Relative: 1 % (ref 0–5)
HCT: 37.4 % (ref 36.0–46.0)
Hemoglobin: 12.7 g/dL (ref 12.0–15.0)
Lymphocytes Relative: 21 % (ref 12–46)
Lymphs Abs: 1.6 10*3/uL (ref 0.7–4.0)
MCH: 29.7 pg (ref 26.0–34.0)
MCHC: 34 g/dL (ref 30.0–36.0)
MCV: 87.6 fL (ref 78.0–100.0)
Monocytes Absolute: 0.6 10*3/uL (ref 0.1–1.0)
Monocytes Relative: 9 % (ref 3–12)
Neutro Abs: 5.1 10*3/uL (ref 1.7–7.7)
Neutrophils Relative %: 69 % (ref 43–77)
Platelets: 280 10*3/uL (ref 150–400)
RBC: 4.27 MIL/uL (ref 3.87–5.11)
RDW: 13.1 % (ref 11.5–15.5)
WBC: 7.4 10*3/uL (ref 4.0–10.5)

## 2014-01-29 LAB — COMPREHENSIVE METABOLIC PANEL
ALT: 18 U/L (ref 0–35)
AST: 18 U/L (ref 0–37)
Albumin: 3.6 g/dL (ref 3.5–5.2)
Alkaline Phosphatase: 62 U/L (ref 39–117)
BUN: 17 mg/dL (ref 6–23)
CO2: 25 mEq/L (ref 19–32)
Calcium: 9.6 mg/dL (ref 8.4–10.5)
Chloride: 101 mEq/L (ref 96–112)
Creatinine, Ser: 0.7 mg/dL (ref 0.50–1.10)
GFR calc Af Amer: >90 mL/min (ref >90)
GFR calc non Af Amer: >90 mL/min (ref >90)
Glucose, Bld: 101 mg/dL — ABNORMAL HIGH (ref 70–99)
Potassium: 3.7 mEq/L (ref 3.7–5.3)
Sodium: 139 mEq/L (ref 137–147)
Total Bilirubin: 0.3 mg/dL (ref 0.3–1.2)
Total Protein: 7.7 g/dL (ref 6.0–8.3)

## 2014-01-29 LAB — PREGNANCY, URINE: Preg Test, Ur: NEGATIVE

## 2014-01-29 MED ORDER — SODIUM CHLORIDE 0.9 % IV BOLUS (SEPSIS)
1000.0000 mL | Freq: Once | INTRAVENOUS | Status: AC
  Administered 2014-01-29: 1000 mL via INTRAVENOUS

## 2014-01-29 MED ORDER — KETOROLAC TROMETHAMINE 30 MG/ML IJ SOLN
30.0000 mg | Freq: Once | INTRAMUSCULAR | Status: AC
  Administered 2014-01-29: 30 mg via INTRAVENOUS
  Filled 2014-01-29: qty 1

## 2014-01-29 MED ORDER — TRAMADOL HCL 50 MG PO TABS: 50.0000 mg | ORAL_TABLET | Freq: Four times a day (QID) | ORAL | Status: DC | PRN

## 2014-01-29 MED ORDER — PENICILLIN V POTASSIUM 500 MG PO TABS: 500.0000 mg | ORAL_TABLET | Freq: Four times a day (QID) | ORAL | Status: AC

## 2014-01-29 MED ORDER — ONDANSETRON HCL 4 MG/2ML IJ SOLN
4.0000 mg | Freq: Once | INTRAMUSCULAR | Status: AC
  Administered 2014-01-29: 4 mg via INTRAVENOUS
  Filled 2014-01-29: qty 2

## 2014-01-29 NOTE — ED Notes (Signed)
Dispo note not completed by primary nurse. Pt being removed from the track at this time. Will have to add in another pain assessment in order for Epic to allow the DC.

## 2014-01-29 NOTE — ED Notes (Addendum)
Patient bagan having N/V, as well as lightheadedness this afternoon, denies abd pain. C/o headache as well.

## 2014-01-29 NOTE — ED Provider Notes (Signed)
CSN: [id number]     Arrival date & time 01/29/14  1611 History   First MD Initiated Contact with Patient 01/29/14 1619     Chief Complaint  Patient presents with  . Emesis     (Consider location/radiation/quality/duration/timing/severity/associated sxs/prior Treatment) HPI Comments: Patient presents with a near syncopal episode. She was at work and had onset of extreme fatigue followed by nausea and lightheadedness. She also developed a bifrontal-type headache which is more in the left side. She denies any neck pain or stiffness. She felt like her blood pressure might be elevated this she checked it was not. She denies any recent illnesses. She denies any numbness or weakness in her extremities. She denies any recent fevers chills cough or congestion. She denies abdominal pain other than her fibroid pain. She's had similar episodes in the past. She's been checked for a mass which was negative. She has a history of migraine headaches but says this doesn't feel as bad as her normal migraine. She denies any photophobia. She denies any numbness or weakness in her extremities. She denies any vision changes or speech deficits. She has been having some pain in her left upper tooth for a few days. She denies any chest pain, palpitations or shortness of breath.  Patient is a 38 y.o. female presenting with vomiting.  Emesis Associated symptoms: headaches   Associated symptoms: no abdominal pain, no arthralgias, no chills and no diarrhea     Past Medical History  Diagnosis Date  . Asthma   . Abortion history   . Migraine headache   . Anemia   . Hypertension   . Fibroid tumor    Past Surgical History  Procedure Laterality Date  . Cesarean section     No family history on file. History  Substance Use Topics  . Smoking status: Never Smoker   . Smokeless tobacco: Not on file  . Alcohol Use: No   OB History   Grav Para Term Preterm Abortions TAB SAB Ect Mult Living                 Review  of Systems  Constitutional: Positive for fatigue. Negative for fever, chills and diaphoresis.  HENT: Negative for congestion, rhinorrhea and sneezing.   Eyes: Negative.   Respiratory: Negative for cough, chest tightness and shortness of breath.   Cardiovascular: Negative for chest pain and leg swelling.  Gastrointestinal: Positive for nausea and vomiting. Negative for abdominal pain, diarrhea and blood in stool.  Genitourinary: Negative for frequency, hematuria, flank pain and difficulty urinating.  Musculoskeletal: Negative for arthralgias and back pain.  Skin: Negative for rash.  Neurological: Positive for light-headedness and headaches. Negative for dizziness, speech difficulty, weakness and numbness.      Allergies  Advair diskus; Lisinopril; and Nyamyc  Home Medications   Prior to Admission medications   Medication Sig Start Date End Date Taking? Authorizing Provider  albuterol (PROVENTIL HFA;VENTOLIN HFA) 108 (90 BASE) MCG/ACT inhaler Inhale 2 puffs into the lungs 2 (two) times daily. For shortness of breath    Historical Provider, MD  amLODipine (NORVASC) 5 MG tablet Take 1 tablet (5 mg total) by mouth daily. 12/24/13   Lahoma Rocker, PA  lisinopril-hydrochlorothiazide (PRINZIDE,ZESTORETIC) 10-12.5 MG per tablet Take 1 tablet by mouth daily.    Historical Provider, MD  oxyCODONE-acetaminophen (PERCOCET) 7.5-325 MG per tablet Take 1 tablet by mouth every 4 (four) hours as needed for pain. 05/14/13   Robbie Lis, MD   BP 138/87  Pulse  68  Temp(Src) 98.1 F (36.7 C) (Oral)  Resp 28  SpO2 100%  LMP 01/22/2014 Physical Exam  Constitutional: She is oriented to person, place, and time. She appears well-developed and well-nourished.  HENT:  Head: Normocephalic and atraumatic.  TTP left upper bicuspid, no swelling  Eyes: Pupils are equal, round, and reactive to light.  Neck: Normal range of motion. Neck supple.  Cardiovascular: Normal rate, regular rhythm and normal  heart sounds.   Pulmonary/Chest: Effort normal and breath sounds normal. No respiratory distress. She has no wheezes. She has no rales. She exhibits no tenderness.  Abdominal: Soft. Bowel sounds are normal. There is tenderness (mild tenderness to suprapubic area which pt says is chronic). There is no rebound and no guarding.  Musculoskeletal: Normal range of motion. She exhibits no edema.  Lymphadenopathy:    She has no cervical adenopathy.  Neurological: She is alert and oriented to person, place, and time. No cranial nerve deficit. GCS eye subscore is 4. GCS verbal subscore is 5. GCS motor subscore is 6.  Motor 5/5 all extremities. Sensation grossly intact to light touch all extremities. No pronator drift. Finger to nose intact.  Skin: Skin is warm and dry. No rash noted.  Psychiatric: She has a normal mood and affect.    ED Course  Procedures (including critical care time) Labs Review Labs Reviewed  COMPREHENSIVE METABOLIC PANEL - Abnormal; Notable for the following:    Glucose, Bld 101 (*)    All other components within normal limits  CBC WITH DIFFERENTIAL  URINALYSIS, ROUTINE W REFLEX MICROSCOPIC  PREGNANCY, URINE    Imaging Review No results found.   EKG Interpretation   Date/Time:  Wednesday January 29 2014 16:50:04 EDT Ventricular Rate:  63 PR Interval:  152 QRS Duration: 76 QT Interval:  398 QTC Calculation: 407 R Axis:   62 Text Interpretation:  Normal sinus rhythm Normal ECG since last tracing no  significant change Confirmed by Auburn Hester  MD, Grabiel Schmutz ([id number]) on 01/29/2014  5:08:04 PM      MDM   Final diagnoses:  Near syncope  Pain, dental    Patient presents with a near syncopal episode. She has associated headache. The headache resolved with some IV fluids and Toradol. She has no photophobia or other symptoms that would be more suggestive an intracranial process such as mass, meningitis, or subarachnoid hemorrhage. She has no neurologic deficits. She has no  symptoms that would be more suggestive of arrhythmia. She has new shortness of breath or infectious symptoms. She's had similar episodes in the past with unknown etiology. She was able to ambulate without difficulty in ED. She was discharged home and encouraged to followup with her primary care physician within the next few days. She's also encouraged to followup with a dentist about her tooth. She's given a prescription for penicillin Ultram.    Malvin Johns, MD 01/29/14 [phone]

## 2014-01-29 NOTE — Discharge Instructions (Signed)
Dental Pain A tooth ache may be caused by cavities (tooth decay). Cavities expose the nerve of the tooth to air and hot or cold temperatures. It may come from an infection or abscess (also called a boil or furuncle) around your tooth. It is also often caused by dental caries (tooth decay). This causes the pain you are having. DIAGNOSIS  Your caregiver can diagnose this problem by exam. TREATMENT   If caused by an infection, it may be treated with medications which kill germs (antibiotics) and pain medications as prescribed by your caregiver. Take medications as directed.  Only take over-the-counter or prescription medicines for pain, discomfort, or fever as directed by your caregiver.  Whether the tooth ache today is caused by infection or dental disease, you should see your dentist as soon as possible for further care. SEEK MEDICAL CARE IF: The exam and treatment you received today has been provided on an emergency basis only. This is not a substitute for complete medical or dental care. If your problem worsens or new problems (symptoms) appear, and you are unable to meet with your dentist, call or return to this location. SEEK IMMEDIATE MEDICAL CARE IF:   You have a fever.  You develop redness and swelling of your face, jaw, or neck.  You are unable to open your mouth.  You have severe pain uncontrolled by pain medicine. MAKE SURE YOU:   Understand these instructions.  Will watch your condition.  Will get help right away if you are not doing well or get worse. Document Released: 10/03/2005 Document Revised: 12/26/2011 Document Reviewed: 05/21/2008 [facility] Patient Information 2014 [place].  Near-Syncope Near-syncope (commonly known as near fainting) is sudden weakness, dizziness, or feeling like you might pass out. During an episode of near-syncope, you may also develop pale skin, have tunnel vision, or feel sick to your stomach (nauseous). Near-syncope may occur when  getting up after sitting or while standing for a long time. It is caused by a sudden decrease in blood flow to the brain. This decrease can result from various causes or triggers, most of which are not serious. However, because near-syncope can sometimes be a sign of something serious, a medical evaluation is required. The specific cause is often not determined. HOME CARE INSTRUCTIONS  Monitor your condition for any changes. The following actions may help to alleviate any discomfort you are experiencing:  Have someone stay with you until you feel stable.  Lie down right away if you start feeling like you might faint. Breathe deeply and steadily. Wait until all the symptoms have passed. Most of these episodes last only a few minutes. You may feel tired for several hours.   Drink enough fluids to keep your urine clear or pale yellow.   If you are taking blood pressure or heart medicine, get up slowly when seated or lying down. Take several minutes to sit and then stand. This can reduce dizziness.  Follow up with your health care provider as directed. SEEK IMMEDIATE MEDICAL CARE IF:   You have a severe headache.   You have unusual pain in the chest, abdomen, or back.   You are bleeding from the mouth or rectum, or you have black or tarry stool.   You have an irregular or very fast heartbeat.   You have repeated fainting or have seizure-like jerking during an episode.   You faint when sitting or lying down.   You have confusion.   You have difficulty walking.   You have  severe weakness.   You have vision problems.  MAKE SURE YOU:   Understand these instructions.  Will watch your condition.  Will get help right away if you are not doing well or get worse. Document Released: 10/03/2005 Document Revised: 06/05/2013 Document Reviewed: 03/08/2013 [facility] Patient Information 2014 [place].

## 2014-03-10 ENCOUNTER — Emergency Department (HOSPITAL_COMMUNITY)
Admission: EM | Admit: 2014-03-10 | Discharge: 2014-03-11 | Disposition: A | Discharge status: Home / Self Care | Payer: Managed Care, Other (non HMO) | Attending: Emergency Medicine | Admitting: Emergency Medicine

## 2014-03-10 ENCOUNTER — Encounter (HOSPITAL_COMMUNITY): Payer: Self-pay | Admitting: Emergency Medicine

## 2014-03-10 DIAGNOSIS — M7989 Other specified soft tissue disorders: Secondary | ICD-10-CM | POA: Insufficient documentation

## 2014-03-10 DIAGNOSIS — I10 Essential (primary) hypertension: Secondary | ICD-10-CM | POA: Insufficient documentation

## 2014-03-10 DIAGNOSIS — M79672 Pain in left foot: Secondary | ICD-10-CM

## 2014-03-10 DIAGNOSIS — J45909 Unspecified asthma, uncomplicated: Secondary | ICD-10-CM | POA: Insufficient documentation

## 2014-03-10 DIAGNOSIS — Z880 Allergy status to penicillin: Secondary | ICD-10-CM | POA: Insufficient documentation

## 2014-03-10 DIAGNOSIS — Z3202 Encounter for pregnancy test, result negative: Secondary | ICD-10-CM | POA: Insufficient documentation

## 2014-03-10 DIAGNOSIS — E876 Hypokalemia: Secondary | ICD-10-CM | POA: Insufficient documentation

## 2014-03-10 DIAGNOSIS — Z8669 Personal history of other diseases of the nervous system and sense organs: Secondary | ICD-10-CM | POA: Insufficient documentation

## 2014-03-10 DIAGNOSIS — Z79899 Other long term (current) drug therapy: Secondary | ICD-10-CM | POA: Insufficient documentation

## 2014-03-10 DIAGNOSIS — Z8619 Personal history of other infectious and parasitic diseases: Secondary | ICD-10-CM | POA: Insufficient documentation

## 2014-03-10 DIAGNOSIS — M79671 Pain in right foot: Secondary | ICD-10-CM

## 2014-03-10 DIAGNOSIS — Z862 Personal history of diseases of the blood and blood-forming organs and certain disorders involving the immune mechanism: Secondary | ICD-10-CM | POA: Insufficient documentation

## 2014-03-10 DIAGNOSIS — M25579 Pain in unspecified ankle and joints of unspecified foot: Secondary | ICD-10-CM | POA: Insufficient documentation

## 2014-03-10 DIAGNOSIS — Z8742 Personal history of other diseases of the female genital tract: Secondary | ICD-10-CM | POA: Insufficient documentation

## 2014-03-10 HISTORY — DX: Zoster without complications: B02.9

## 2014-03-10 NOTE — ED Notes (Signed)
Pt states that she was recently placed on Valtrex, and has been experiencing bilateral lower leg swelling, lower back pain and swelling to her L eye since that time. Pt states she quit taking this medication and even after that her legs and eye have continued to swell. Pt a&o x4, skin warm and dry, ambulatory to triage.

## 2014-03-11 LAB — PREGNANCY, URINE: Preg Test, Ur: NEGATIVE

## 2014-03-11 LAB — I-STAT CHEM 8, ED
BUN: 18 mg/dL (ref 6–23)
Calcium, Ion: 1.14 mmol/L (ref 1.12–1.23)
Chloride: 103 mEq/L (ref 96–112)
Creatinine, Ser: 0.8 mg/dL (ref 0.50–1.10)
Glucose, Bld: 102 mg/dL — ABNORMAL HIGH (ref 70–99)
HCT: 38 % (ref 36.0–46.0)
Hemoglobin: 12.9 g/dL (ref 12.0–15.0)
Potassium: 3.2 mEq/L — ABNORMAL LOW (ref 3.7–5.3)
Sodium: 139 mEq/L (ref 137–147)
TCO2: 21 mmol/L (ref 0–100)

## 2014-03-11 MED ORDER — POTASSIUM CHLORIDE CRYS ER 20 MEQ PO TBCR: 40.0000 meq | EXTENDED_RELEASE_TABLET | Freq: Once | ORAL | Status: DC

## 2014-03-11 MED ORDER — TRAMADOL HCL 50 MG PO TABS: 50.0000 mg | ORAL_TABLET | Freq: Four times a day (QID) | ORAL | Status: DC | PRN

## 2014-03-11 MED ORDER — POTASSIUM CHLORIDE CRYS ER 20 MEQ PO TBCR
40.0000 meq | EXTENDED_RELEASE_TABLET | Freq: Once | ORAL | Status: AC
  Administered 2014-03-11: 40 meq via ORAL
  Filled 2014-03-11: qty 2

## 2014-03-11 NOTE — ED Notes (Signed)
MD at bedside. 

## 2014-03-11 NOTE — ED Notes (Signed)
Negative Homan's sign

## 2014-03-11 NOTE — ED Provider Notes (Signed)
CSN: [id number]     Arrival date & time 03/10/14  2309 History   First MD Initiated Contact with Patient 03/11/14 0319     Chief Complaint  Patient presents with  . Leg Swelling     (Consider location/radiation/quality/duration/timing/severity/associated sxs/prior Treatment) HPI Comments: Patient is a 38 year old female with a history of hypertension, anemia, and asthma who presents to the emergency department for bilateral lower leg swelling. Patient states that symptoms began approximately 2 weeks ago and has been gradually worsening since onset. Patient states that symptoms were at their worst over the last 48 hours when she experienced the sensation of swelling behind her bilateral knees. Patient has also experienced associated pain in her feet and ankles which is worsened with worsening swelling. Patient states that symptoms began one week after beginning Valtrex for a questionable HSV-2 infection; however, patient denies a hx of herpes. She was started on this medication by her PCP for "herpes antibodies in her blood". She discontinued Valtrex 1 week ago. Patient also notes changes in her medications from lisinopril to amlodipine 2 months ago. She denies associated redness in her legs, warmth to touch, fever, shortness of breath, cough, hemoptysis, chest pain, syncope, loss of sensation in her extremities, incontinence, trauma or injury, a hx of DVT or PE, or a hx of DM. No recent surgeries or hospitalizations.  The history is provided by the patient. No language interpreter was used.    Past Medical History  Diagnosis Date  . Asthma   . Abortion history   . Migraine headache   . Anemia   . Hypertension   . Fibroid tumor   . Shingles    Past Surgical History  Procedure Laterality Date  . Cesarean section     History reviewed. No pertinent family history. History  Substance Use Topics  . Smoking status: Never Smoker   . Smokeless tobacco: Never Used  . Alcohol Use: No   OB  History   Grav Para Term Preterm Abortions TAB SAB Ect Mult Living                 Review of Systems  Constitutional: Negative for fever.  Respiratory: Negative for cough (and no hemoptysis), chest tightness and shortness of breath.   Cardiovascular: Positive for leg swelling (bilateral). Negative for chest pain.  Musculoskeletal: Positive for arthralgias and myalgias.  Skin: Negative for color change and wound.  Neurological: Negative for dizziness, syncope, weakness, light-headedness and numbness.  All other systems reviewed and are negative.     Allergies  Advair diskus; Erythromycin; Lisinopril; Nyamyc; and Penicillins  Home Medications   Prior to Admission medications   Medication Sig Start Date End Date Taking? Authorizing Provider  albuterol (PROVENTIL HFA;VENTOLIN HFA) 108 (90 BASE) MCG/ACT inhaler Inhale 2 puffs into the lungs 2 (two) times daily. For shortness of breath   Yes Historical Provider, MD  amLODipine (NORVASC) 5 MG tablet Take 1 tablet (5 mg total) by mouth daily. 12/24/13  Yes Lahoma Rocker, PA  valACYclovir (VALTREX) 1000 MG tablet Take 1,000 mg by mouth 2 (two) times daily.   Yes Historical Provider, MD  traMADol (ULTRAM) 50 MG tablet Take 1 tablet (50 mg total) by mouth every 6 (six) hours as needed. 03/11/14   Antonietta Breach, PA-C   BP 130/82  Pulse 73  Temp(Src) 98 F (36.7 C) (Oral)  Resp 16  Ht 5' (1.524 m)  Wt 191 lb (86.637 kg)  BMI 37.30 kg/m2  SpO2 100%  LMP 02/09/2014  Physical Exam  Nursing note and vitals reviewed. Constitutional: She is oriented to person, place, and time. She appears well-developed and well-nourished. No distress.  Nontoxic/nonseptic appearing  HENT:  Head: Normocephalic and atraumatic.  Mouth/Throat: Oropharynx is clear and moist. No oropharyngeal exudate.  Eyes: Conjunctivae and EOM are normal. Pupils are equal, round, and reactive to light. No scleral icterus.  Neck: Normal range of motion.  Cardiovascular:  Normal rate, regular rhythm, normal heart sounds and intact distal pulses.   Pulses:      Dorsalis pedis pulses are 2+ on the right side, and 2+ on the left side.       Posterior tibial pulses are 2+ on the right side, and 2+ on the left side.  Pulmonary/Chest: Effort normal. No respiratory distress. She has no wheezes. She has no rales.  No tachypnea or dyspnea. Chest expansion symmetric.  Abdominal: Soft. She exhibits no distension. There is no tenderness. There is no rebound and no guarding.  Soft, nontender  Musculoskeletal: Normal range of motion. She exhibits no edema and no tenderness.  No pitting edema in b/l lower extremities. No swelling of b/l knee joints or swelling to popliteal fossa.  Neurological: She is alert and oriented to person, place, and time. She has normal strength and normal reflexes. No sensory deficit. She exhibits normal muscle tone. Coordination normal. GCS eye subscore is 4. GCS verbal subscore is 5. GCS motor subscore is 6.  Reflex Scores:      Patellar reflexes are 2+ on the right side and 2+ on the left side.      Achilles reflexes are 2+ on the right side and 2+ on the left side. No gross sensory deficits appreciated. DTRs normal and symmetric. Patient ambulates with normal gait. Patient wiggles all toes.  Skin: Skin is warm and dry. No rash noted. She is not diaphoretic. No erythema. No pallor.  No erythema or heat to touch of bilateral lower extremities.  Psychiatric: She has a normal mood and affect. Her behavior is normal.    ED Course  Procedures (including critical care time) Labs Review Labs Reviewed  I-STAT CHEM 8, ED - Abnormal; Notable for the following:    Potassium 3.2 (*)    Glucose, Bld 102 (*)    All other components within normal limits  PREGNANCY, URINE    Imaging Review No results found.   EKG Interpretation None      MDM   Final diagnoses:  Leg swelling  Foot pain, bilateral  Hypokalemia    Uncomplicated bilateral  foot pain and lower extremity pain. Patient well and nontoxic appearing, hemodynamically stable, and afebrile. She is neurovascularly intact on physical exam with no sensory deficits. No pitting edema appreciated. No erythema, red the near streaking, or heat to touch. Patient ambulates with normal gait. Urine pregnancy negative. Labs significant only for hypokalemia of 3.2; patient given oral potassium in ED. Doubt DVT as cause of symptoms as swelling, per patient, appreciated to be b/l. Patient also with no hx of blood clot or risk factors for DVT. Patient PERC negative. Unclear etiology of symptoms, but I do not appreciate them to be emergent. I believe patient is stable and appropriate for discharge with instruction to f/u with her primary care provider. Return precautions provided and patient agreeable to plan with no unaddressed concerns.   Filed Vitals:   03/10/14 2350 03/11/14 0454  BP: 165/92 130/82  Pulse: 81 73  Temp: 97.9 F (36.6 C) 98 F (36.7 C)  TempSrc:  Oral Oral  Resp: 18 16  Height: 5' (1.524 m)   Weight: 191 lb (86.637 kg)   SpO2: 100% 100%      Antonietta Breach, PA-C 03/13/14 1508

## 2014-03-11 NOTE — ED Notes (Addendum)
Patient states that she has unexplained pain to both calves and edema. She is not sure if this is related to the valtrex she had been recently prescribed. 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

## 2014-03-11 NOTE — Discharge Instructions (Signed)
Recommended you decrease your salt intake and processes in your diet. Recommend compression stockings to improve swelling. Followup with your primary care doctor. You may take tramadol as needed for pain.  Peripheral Edema You have swelling in your legs (peripheral edema). This swelling is due to excess accumulation of salt and water in your body. Edema may be a sign of heart, kidney or liver disease, or a side effect of a medication. It may also be due to problems in the leg veins. Elevating your legs and using special support stockings may be very helpful, if the cause of the swelling is due to poor venous circulation. Avoid long periods of standing, whatever the cause. Treatment of edema depends on identifying the cause. Chips, pretzels, pickles and other salty foods should be avoided. Restricting salt in your diet is almost always needed. Water pills (diuretics) are often used to remove the excess salt and water from your body via urine. These medicines prevent the kidney from reabsorbing sodium. This increases urine flow. Diuretic treatment may also result in lowering of potassium levels in your body. Potassium supplements may be needed if you have to use diuretics daily. Daily weights can help you keep track of your progress in clearing your edema. You should call your caregiver for follow up care as recommended. SEEK IMMEDIATE MEDICAL CARE IF:   You have increased swelling, pain, redness, or heat in your legs.  You develop shortness of breath, especially when lying down.  You develop chest or abdominal pain, weakness, or fainting.  You have a fever. Document Released: 11/10/2004 Document Revised: 12/26/2011 Document Reviewed: 10/21/2009 [facility] Patient Information 2014 [place].

## 2014-03-14 NOTE — ED Provider Notes (Signed)
Medical screening examination/treatment/procedure(s) were performed by non-physician practitioner and as supervising physician I was immediately available for consultation/collaboration.   EKG Interpretation None       Varney Biles, MD 03/14/14 [phone]

## 2014-04-21 ENCOUNTER — Encounter (HOSPITAL_COMMUNITY): Payer: Self-pay | Admitting: Emergency Medicine

## 2014-04-21 ENCOUNTER — Emergency Department (HOSPITAL_COMMUNITY)
Admission: EM | Admit: 2014-04-21 | Discharge: 2014-04-22 | Disposition: A | Discharge status: Home / Self Care | Payer: Managed Care, Other (non HMO) | Attending: Emergency Medicine | Admitting: Emergency Medicine

## 2014-04-21 DIAGNOSIS — J45909 Unspecified asthma, uncomplicated: Secondary | ICD-10-CM | POA: Insufficient documentation

## 2014-04-21 DIAGNOSIS — Z880 Allergy status to penicillin: Secondary | ICD-10-CM | POA: Insufficient documentation

## 2014-04-21 DIAGNOSIS — Z8679 Personal history of other diseases of the circulatory system: Secondary | ICD-10-CM | POA: Insufficient documentation

## 2014-04-21 DIAGNOSIS — I10 Essential (primary) hypertension: Secondary | ICD-10-CM | POA: Insufficient documentation

## 2014-04-21 DIAGNOSIS — Z79899 Other long term (current) drug therapy: Secondary | ICD-10-CM | POA: Insufficient documentation

## 2014-04-21 DIAGNOSIS — H119 Unspecified disorder of conjunctiva: Secondary | ICD-10-CM | POA: Insufficient documentation

## 2014-04-21 DIAGNOSIS — Z8619 Personal history of other infectious and parasitic diseases: Secondary | ICD-10-CM | POA: Insufficient documentation

## 2014-04-21 DIAGNOSIS — H5789 Other specified disorders of eye and adnexa: Secondary | ICD-10-CM

## 2014-04-21 DIAGNOSIS — Z862 Personal history of diseases of the blood and blood-forming organs and certain disorders involving the immune mechanism: Secondary | ICD-10-CM | POA: Insufficient documentation

## 2014-04-21 MED ORDER — DEXAMETHASONE SODIUM PHOSPHATE 10 MG/ML IJ SOLN: 10.0000 mg | Freq: Once | INTRAMUSCULAR | Status: DC

## 2014-04-21 MED ORDER — METHYLPREDNISOLONE SODIUM SUCC 125 MG IJ SOLR
125.0000 mg | Freq: Once | INTRAMUSCULAR | Status: AC
  Administered 2014-04-21: 125 mg via INTRAVENOUS
  Filled 2014-04-21: qty 2

## 2014-04-21 MED ORDER — DIPHENHYDRAMINE HCL 50 MG/ML IJ SOLN
25.0000 mg | Freq: Once | INTRAMUSCULAR | Status: AC
  Administered 2014-04-21: 25 mg via INTRAVENOUS
  Filled 2014-04-21: qty 1

## 2014-04-21 MED ORDER — DIPHENHYDRAMINE HCL 50 MG/ML IJ SOLN: 25.0000 mg | Freq: Once | INTRAMUSCULAR | Status: DC

## 2014-04-21 MED ORDER — SODIUM CHLORIDE 0.9 % IV BOLUS (SEPSIS)
1000.0000 mL | Freq: Once | INTRAVENOUS | Status: AC
  Administered 2014-04-21: 1000 mL via INTRAVENOUS

## 2014-04-21 MED ORDER — FAMOTIDINE IN NACL 20-0.9 MG/50ML-% IV SOLN
20.0000 mg | Freq: Once | INTRAVENOUS | Status: AC
Start: 2014-04-21 — End: 2014-04-22
  Administered 2014-04-21: 20 mg via INTRAVENOUS
  Filled 2014-04-21: qty 50

## 2014-04-21 MED ORDER — FAMOTIDINE IN NACL 20-0.9 MG/50ML-% IV SOLN: 20.0000 mg | Freq: Once | INTRAVENOUS | Status: DC

## 2014-04-21 NOTE — ED Notes (Signed)
Pt has been placed on cardiac monitoring. RN currently at bedside. PA previously at bedside.

## 2014-04-21 NOTE — ED Provider Notes (Signed)
CSN: [id number]     Arrival date & time 04/21/14  2309 History   First MD Initiated Contact with Patient 04/21/14 2326     Chief Complaint  Patient presents with  . Allergic Reaction    bilateral eye pain and swelling     (Consider location/radiation/quality/duration/timing/severity/associated sxs/prior Treatment) Patient is a 38 y.o. female presenting with allergic reaction. The history is provided by the patient and medical records.  Allergic Reaction  This is a 38 year old female with past medical history significant for asthma, migraine headaches, hypertension, fibroids, presenting to the ED for an allergic reaction.  Patient states she was peeling and cleaning shrimp with her eyes began to with a sensation of swelling. She endorses associated photophobia, date feels better with eyes closed.  Denies trauma to eye.  No chemical or FB exposure.  No recent changes in soaps, detergents, or personal cosmetics.  She denies prior reaction to shellfish, but states she did have allergy testing done last week which showed that he was allergic to shrimp. She states she has eaten shrimp since this time without reaction.  VS stable on arrival.  Past Medical History  Diagnosis Date  . Asthma   . Abortion history   . Migraine headache   . Anemia   . Hypertension   . Fibroid tumor   . Shingles    Past Surgical History  Procedure Laterality Date  . Cesarean section    . Cystectomy      breast   No family history on file. History  Substance Use Topics  . Smoking status: Never Smoker   . Smokeless tobacco: Never Used  . Alcohol Use: No   OB History   Grav Para Term Preterm Abortions TAB SAB Ect Mult Living                 Review of Systems  Eyes: Positive for photophobia, pain, redness and itching.  All other systems reviewed and are negative.     Allergies  Advair diskus; Erythromycin; Lisinopril; Nyamyc; and Penicillins  Home Medications   Prior to Admission medications    Medication Sig Start Date End Date Taking? Authorizing Provider  albuterol (PROVENTIL HFA;VENTOLIN HFA) 108 (90 BASE) MCG/ACT inhaler Inhale 2 puffs into the lungs 2 (two) times daily. For shortness of breath    Historical Provider, MD  amLODipine (NORVASC) 5 MG tablet Take 1 tablet (5 mg total) by mouth daily. 12/24/13   Lahoma Rocker, PA  traMADol (ULTRAM) 50 MG tablet Take 1 tablet (50 mg total) by mouth every 6 (six) hours as needed. 03/11/14   Antonietta Breach, PA-C  valACYclovir (VALTREX) 1000 MG tablet Take 1,000 mg by mouth 2 (two) times daily.    Historical Provider, MD   BP 136/90  Pulse 73  Temp(Src) 98.6 F (37 C) (Oral)  Resp 16  SpO2 100%  LMP 03/25/2014  Physical Exam  Nursing note and vitals reviewed. Constitutional: She is oriented to person, place, and time. She appears well-developed and well-nourished. No distress.  HENT:  Head: Normocephalic and atraumatic.  Mouth/Throat: Oropharynx is clear and moist.  Handling secretions appropriately, airway patent, no oral lesions noted  Eyes: EOM are normal. Pupils are equal, round, and reactive to light. Right conjunctiva is injected. Left conjunctiva is injected.  Bilateral conjunctiva injected with tearing present, mild edema of left upper lid without erythema or induration; EOM intact and non-painful  Neck: Normal range of motion. Neck supple.  Cardiovascular: Normal rate, regular rhythm and normal  heart sounds.   Pulmonary/Chest: Effort normal and breath sounds normal. No respiratory distress. She has no wheezes.  Abdominal: Soft. Bowel sounds are normal. There is no tenderness. There is no guarding.  Musculoskeletal: Normal range of motion.  Neurological: She is alert and oriented to person, place, and time.  Skin: Skin is warm and dry. She is not diaphoretic.  Psychiatric: She has a normal mood and affect.    ED Course  Procedures (including critical care time) Labs Review Labs Reviewed - No data to  display  Imaging Review No results found.   EKG Interpretation None      MDM   Final diagnoses:  Eye redness  Burning sensation in eye   38 y.o. F presenting with eye itching, burning, and tearing after handling and cleaning shrimp at home. She denies any foreign body or chemical exposure to the eye.  On exam, bilateral conjunctiva are injected with tearing.  Mild edema of left upper lid without cellulitic changes.  Airway is patent, there is no respiratory distress noted. Patient given IV fluids, Solu-Medrol, Pepcid, and Benadryl. Will monitor closely.  On repeat evaluation, patient states she is feeling better. Her left upper lid edema has not progressed any further and remains without cellulitic changes. Her conjunctiva are starting to clear. Her airway remains patient, no angioedema, no respiratory distress.  She'll be discharged home a PCP followup. She is instructed to avoid shrimp. She may continue over-the-counter eyedrops as needed for discomfort.  Discussed plan with patient, he/she acknowledged understanding and agreed with plan of care.  Return precautions given for new or worsening symptoms.  Larene Pickett, PA-C 04/22/14 [phone]

## 2014-04-21 NOTE — ED Notes (Signed)
Patient states she was cleaning shrimp when her eyes began to burn and swell. C/o bilateral eye pain. Denies any prior allergy to shellfish.

## 2014-04-22 MED ORDER — TETRACAINE HCL 0.5 % OP SOLN
1.0000 [drp] | Freq: Once | OPHTHALMIC | Status: AC
  Administered 2014-04-22: 1 [drp] via OPHTHALMIC
  Filled 2014-04-22: qty 2

## 2014-04-22 NOTE — Discharge Instructions (Signed)
Avoid shrimp for the next few days. May continue using visine drops for eye discomfort. Follow-up with your primary care physician. Return to the ED for new concerns.

## 2014-04-22 NOTE — ED Provider Notes (Signed)
Medical screening examination/treatment/procedure(s) were conducted as a shared visit with non-physician practitioner(s) and myself.  I personally evaluated the patient during the encounter.  Edema of eyelids and mild conjunctival injection c/w acute allergic reaction. Patient has tested positive for shrimp allergy in the past.    Wynetta Fines, MD 04/22/14 [phone]

## 2014-10-14 ENCOUNTER — Emergency Department (HOSPITAL_BASED_OUTPATIENT_CLINIC_OR_DEPARTMENT_OTHER)
Admission: EM | Admit: 2014-10-14 | Discharge: 2014-10-14 | Disposition: A | Discharge status: Home / Self Care | Payer: Managed Care, Other (non HMO) | Attending: Emergency Medicine | Admitting: Emergency Medicine

## 2014-10-14 ENCOUNTER — Emergency Department (HOSPITAL_BASED_OUTPATIENT_CLINIC_OR_DEPARTMENT_OTHER): Payer: Managed Care, Other (non HMO)

## 2014-10-14 ENCOUNTER — Encounter (HOSPITAL_BASED_OUTPATIENT_CLINIC_OR_DEPARTMENT_OTHER): Payer: Self-pay

## 2014-10-14 DIAGNOSIS — I10 Essential (primary) hypertension: Secondary | ICD-10-CM | POA: Diagnosis not present

## 2014-10-14 DIAGNOSIS — Z880 Allergy status to penicillin: Secondary | ICD-10-CM | POA: Insufficient documentation

## 2014-10-14 DIAGNOSIS — J45909 Unspecified asthma, uncomplicated: Secondary | ICD-10-CM | POA: Diagnosis not present

## 2014-10-14 DIAGNOSIS — R0789 Other chest pain: Secondary | ICD-10-CM | POA: Insufficient documentation

## 2014-10-14 DIAGNOSIS — Z79899 Other long term (current) drug therapy: Secondary | ICD-10-CM | POA: Diagnosis not present

## 2014-10-14 DIAGNOSIS — Z86018 Personal history of other benign neoplasm: Secondary | ICD-10-CM | POA: Insufficient documentation

## 2014-10-14 DIAGNOSIS — H9202 Otalgia, left ear: Secondary | ICD-10-CM | POA: Diagnosis not present

## 2014-10-14 DIAGNOSIS — J029 Acute pharyngitis, unspecified: Secondary | ICD-10-CM | POA: Diagnosis present

## 2014-10-14 DIAGNOSIS — Z862 Personal history of diseases of the blood and blood-forming organs and certain disorders involving the immune mechanism: Secondary | ICD-10-CM | POA: Diagnosis not present

## 2014-10-14 DIAGNOSIS — J020 Streptococcal pharyngitis: Secondary | ICD-10-CM | POA: Diagnosis not present

## 2014-10-14 DIAGNOSIS — R079 Chest pain, unspecified: Secondary | ICD-10-CM

## 2014-10-14 DIAGNOSIS — Z8619 Personal history of other infectious and parasitic diseases: Secondary | ICD-10-CM | POA: Diagnosis not present

## 2014-10-14 LAB — CBC WITH DIFFERENTIAL/PLATELET
Basophils Absolute: 0 10*3/uL (ref 0.0–0.1)
Basophils Relative: 0 % (ref 0–1)
Eosinophils Absolute: 0.1 10*3/uL (ref 0.0–0.7)
Eosinophils Relative: 1 % (ref 0–5)
HCT: 37 % (ref 36.0–46.0)
Hemoglobin: 12.2 g/dL (ref 12.0–15.0)
Lymphocytes Relative: 10 % — ABNORMAL LOW (ref 12–46)
Lymphs Abs: 1.1 10*3/uL (ref 0.7–4.0)
MCH: 28.4 pg (ref 26.0–34.0)
MCHC: 33 g/dL (ref 30.0–36.0)
MCV: 86.2 fL (ref 78.0–100.0)
Monocytes Absolute: 0.7 10*3/uL (ref 0.1–1.0)
Monocytes Relative: 7 % (ref 3–12)
Neutro Abs: 9 10*3/uL — ABNORMAL HIGH (ref 1.7–7.7)
Neutrophils Relative %: 82 % — ABNORMAL HIGH (ref 43–77)
Platelets: 281 10*3/uL (ref 150–400)
RBC: 4.29 MIL/uL (ref 3.87–5.11)
RDW: 13.1 % (ref 11.5–15.5)
WBC: 10.9 10*3/uL — ABNORMAL HIGH (ref 4.0–10.5)

## 2014-10-14 LAB — BASIC METABOLIC PANEL
Anion gap: 8 (ref 5–15)
BUN: 12 mg/dL (ref 6–23)
CO2: 25 mmol/L (ref 19–32)
Calcium: 8.9 mg/dL (ref 8.4–10.5)
Chloride: 103 mEq/L (ref 96–112)
Creatinine, Ser: 0.71 mg/dL (ref 0.50–1.10)
GFR calc Af Amer: >90 mL/min (ref >90)
GFR calc non Af Amer: >90 mL/min (ref >90)
Glucose, Bld: 101 mg/dL — ABNORMAL HIGH (ref 70–99)
Potassium: 3.5 mmol/L (ref 3.5–5.1)
Sodium: 136 mmol/L (ref 135–145)

## 2014-10-14 LAB — RAPID STREP SCREEN (MED CTR MEBANE ONLY): Streptococcus, Group A Screen (Direct): POSITIVE — AB

## 2014-10-14 LAB — TROPONIN I: Troponin I: <0.03 ng/mL (ref <0.031)

## 2014-10-14 MED ORDER — SODIUM CHLORIDE 0.9 % IV BOLUS (SEPSIS)
1000.0000 mL | Freq: Once | INTRAVENOUS | Status: AC
  Administered 2014-10-14: 1000 mL via INTRAVENOUS

## 2014-10-14 MED ORDER — CEPHALEXIN 500 MG PO CAPS: 500.0000 mg | ORAL_CAPSULE | Freq: Four times a day (QID) | ORAL | Status: DC

## 2014-10-14 MED ORDER — ACETAMINOPHEN 325 MG PO TABS
650.0000 mg | ORAL_TABLET | Freq: Once | ORAL | Status: AC
  Administered 2014-10-14: 650 mg via ORAL
  Filled 2014-10-14: qty 2

## 2014-10-14 NOTE — ED Provider Notes (Signed)
CSN: [id number]     Arrival date & time 10/14/14  0825 History   First MD Initiated Contact with Patient 10/14/14 0900     Chief Complaint  Patient presents with  . Sore Throat     (Consider location/radiation/quality/duration/timing/severity/associated sxs/prior Treatment) Patient is a 38 y.o. female presenting with pharyngitis. The history is provided by the patient. No language interpreter was used.  Sore Throat This is a new problem. The current episode started yesterday. The problem occurs constantly. The problem has been gradually worsening. Associated symptoms include chest pain. Pertinent negatives include no abdominal pain, no headaches and no shortness of breath. The symptoms are aggravated by swallowing. Nothing relieves the symptoms. She has tried nothing for the symptoms. The treatment provided no relief.    Past Medical History  Diagnosis Date  . Asthma   . Abortion history   . Migraine headache   . Anemia   . Hypertension   . Fibroid tumor   . Shingles    Past Surgical History  Procedure Laterality Date  . Cesarean section    . Cystectomy      breast   No family history on file. History  Substance Use Topics  . Smoking status: Never Smoker   . Smokeless tobacco: Never Used  . Alcohol Use: No   OB History    No data available     Review of Systems  Constitutional: Negative for fever, chills, diaphoresis, activity change, appetite change and fatigue.  HENT: Positive for congestion, ear pain and sore throat. Negative for facial swelling and rhinorrhea.   Eyes: Negative for photophobia and discharge.  Respiratory: Negative for cough, chest tightness and shortness of breath.   Cardiovascular: Positive for chest pain. Negative for palpitations and leg swelling.  Gastrointestinal: Negative for nausea, vomiting, abdominal pain and diarrhea.  Endocrine: Negative for polydipsia and polyuria.  Genitourinary: Negative for dysuria, frequency, difficulty urinating  and pelvic pain.  Musculoskeletal: Negative for back pain, arthralgias, neck pain and neck stiffness.  Skin: Negative for color change and wound.  Allergic/Immunologic: Negative for immunocompromised state.  Neurological: Negative for facial asymmetry, weakness, numbness and headaches.  Hematological: Does not bruise/bleed easily.  Psychiatric/Behavioral: Negative for confusion and agitation.      Allergies  Advair diskus; Erythromycin; Lisinopril; Nyamyc; and Penicillins  Home Medications   Prior to Admission medications   Medication Sig Start Date End Date Taking? Authorizing Provider  albuterol (PROVENTIL HFA;VENTOLIN HFA) 108 (90 BASE) MCG/ACT inhaler Inhale 2 puffs into the lungs 2 (two) times daily. For shortness of breath    Historical Provider, MD  amLODipine (NORVASC) 5 MG tablet Take 1 tablet (5 mg total) by mouth daily. 12/24/13   Audelia Hives Presson, PA  valACYclovir (VALTREX) 1000 MG tablet Take 1,000 mg by mouth 2 (two) times daily.    Historical Provider, MD   BP 143/87 mmHg  Pulse 96  Temp(Src) 99.4 F (37.4 C) (Oral)  Resp 16  Ht 5' (1.524 m)  Wt 180 lb (81.647 kg)  BMI 35.15 kg/m2  SpO2 97%  LMP 10/07/2014 Physical Exam  Constitutional: She is oriented to person, place, and time. She appears well-developed and well-nourished. No distress.  HENT:  Head: Normocephalic and atraumatic.  Mouth/Throat: Mucous membranes are normal. Posterior oropharyngeal erythema present. No oropharyngeal exudate, posterior oropharyngeal edema or tonsillar abscesses.  Eyes: Pupils are equal, round, and reactive to light.  Neck: Normal range of motion. Neck supple.  Cardiovascular: Normal rate, regular rhythm and normal heart sounds.  Exam reveals no gallop and no friction rub.   No murmur heard. Pulmonary/Chest: Effort normal and breath sounds normal. No respiratory distress. She has no wheezes. She has no rales.  Abdominal: Soft. Bowel sounds are normal. She exhibits no  distension and no mass. There is no tenderness. There is no rebound and no guarding.  Musculoskeletal: Normal range of motion. She exhibits no edema or tenderness.  Neurological: She is alert and oriented to person, place, and time.  Skin: Skin is warm and dry.  Psychiatric: She has a normal mood and affect.    ED Course  Procedures (including critical care time) Labs Review Labs Reviewed  RAPID STREP SCREEN - Abnormal; Notable for the following:    Streptococcus, Group A Screen (Direct) POSITIVE (*)    All other components within normal limits  CBC WITH DIFFERENTIAL  BASIC METABOLIC PANEL  TROPONIN I    Imaging Review Dg Chest 2 View  10/14/2014   CLINICAL DATA:  Chest pain and fatigue for 1 day  EXAM: CHEST  2 VIEW  COMPARISON:  May 13, 2013; October 11, 2012  FINDINGS: There is a stable calcified granuloma in the right base. Elsewhere, the lungs are clear. Heart size and pulmonary vascularity are normal. No adenopathy. No pneumothorax. No bone lesions.  IMPRESSION: No edema or consolidation.  Stable granuloma right base.   Electronically Signed   By: Lowella Grip M.D.   On: 10/14/2014 09:41     EKG Interpretation   Date/Time:  Tuesday October 14 2014 09:40:42 EST Ventricular Rate:  75 PR Interval:  162 QRS Duration: 68 QT Interval:  358 QTC Calculation: 399 R Axis:   44 Text Interpretation:  Normal sinus rhythm Nonspecific T wave abnormality  Abnormal ECG Unchanged from prior Confirmed by Lindalee Huizinga  MD, Cornelius Schuitema ([id number])  on 10/14/2014 9:42:45 AM      MDM   Final diagnoses:  Chest pain  Strep pharyngitis    Pt is a 38 y.o. female with Pmhx as above who presents with sore throat starting yesterday, now today with left ear pain, left-sided chest tightness with this, worse with deep breathing.  She denies shortness of breath, smoking, she is several days, has symmetric foot and ankle swelling.  No calf pain or tenderness.  On physical exam, patient has low-grade  temperature.  Vital signs are normal, 97% on room air with a heart rate of 96.  She has bilateral tonsillar erythema.  Bilateral anterior cervical lymphadenopathy.  Lungs are clear.  Chest is nontender.  Rapid strep is positive.  EKG unchanged form prior w/ no acute ischemic changes, chest x-ray nml. Trop neg, pt low risk for MACE using HEART score, PERC negative, PE unlikely.  CP likely due to acute illness. Will d/c home w/ keflex (due to drug allergies), instructions for antipyretics, inc fluid intake.      Lakshmi O Plumb evaluation in the Emergency Department is complete. It has been determined that no acute conditions requiring further emergency intervention are present at this time. The patient/guardian have been advised of the diagnosis and plan. We have discussed signs and symptoms that warrant return to the ED, such as changes or worsening in symptoms, worsening pain, swelling, inability to tolerate liquids.       Ernestina Patches, MD 10/14/14 [phone]

## 2014-10-14 NOTE — Discharge Instructions (Signed)
°Chest Pain (Nonspecific) °It is often hard to give a specific diagnosis for the cause of chest pain. There is always a chance that your pain could be related to something serious, such as a heart attack or a blood clot in the lungs. You need to follow up with your health care provider for further evaluation. °CAUSES  °· Heartburn. °· Pneumonia or bronchitis. °· Anxiety or stress. °· Inflammation around your heart (pericarditis) or lung (pleuritis or pleurisy). °· A blood clot in the lung. °· A collapsed lung (pneumothorax). It can develop suddenly on its own (spontaneous pneumothorax) or from trauma to the chest. °· Shingles infection (herpes zoster virus). °The chest wall is composed of bones, muscles, and cartilage. Any of these can be the source of the pain. °· The bones can be bruised by injury. °· The muscles or cartilage can be strained by coughing or overwork. °· The cartilage can be affected by inflammation and become sore (costochondritis). °DIAGNOSIS  °Lab tests or other studies may be needed to find the cause of your pain. Your health care provider may have you take a test called an ambulatory electrocardiogram (ECG). An ECG records your heartbeat patterns over a 24-hour period. You may also have other tests, such as: °· Transthoracic echocardiogram (TTE). During echocardiography, sound waves are used to evaluate how blood flows through your heart. °· Transesophageal echocardiogram (TEE). °· Cardiac monitoring. This allows your health care provider to monitor your heart rate and rhythm in real time. °· Holter monitor. This is a portable device that records your heartbeat and can help diagnose heart arrhythmias. It allows your health care provider to track your heart activity for several days, if needed. °· Stress tests by exercise or by giving medicine that makes the heart beat faster. °TREATMENT  °· Treatment depends on what may be causing your chest pain. Treatment may include: °¨ Acid blockers for  heartburn. °¨ Anti-inflammatory medicine. °¨ Pain medicine for inflammatory conditions. °¨ Antibiotics if an infection is present. °· You may be advised to change lifestyle habits. This includes stopping smoking and avoiding alcohol, caffeine, and chocolate. °· You may be advised to keep your head raised (elevated) when sleeping. This reduces the chance of acid going backward from your stomach into your esophagus. °Most of the time, nonspecific chest pain will improve within 2-3 days with rest and mild pain medicine.  °HOME CARE INSTRUCTIONS  °· If antibiotics were prescribed, take them as directed. Finish them even if you start to feel better. °· For the next few days, avoid physical activities that bring on chest pain. Continue physical activities as directed. °· Do not use any tobacco products, including cigarettes, chewing tobacco, or electronic cigarettes. °· Avoid drinking alcohol. °· Only take medicine as directed by your health care provider. °· Follow your health care provider's suggestions for further testing if your chest pain does not go away. °· Keep any follow-up appointments you made. If you do not go to an appointment, you could develop lasting (chronic) problems with pain. If there is any problem keeping an appointment, call to reschedule. °SEEK MEDICAL CARE IF:  °· Your chest pain does not go away, even after treatment. °· You have a rash with blisters on your chest. °· You have a fever. °SEEK IMMEDIATE MEDICAL CARE IF:  °· You have increased chest pain or pain that spreads to your arm, neck, jaw, back, or abdomen. °· You have shortness of breath. °· You have an increasing cough, or you cough   up blood.  You have severe back or abdominal pain.  You feel nauseous or vomit.  You have severe weakness.  You faint.  You have chills. This is an emergency. Do not wait to see if the pain will go away. Get medical help at once. Call your local emergency services (911 in [place] Do not drive  yourself to the hospital. MAKE SURE YOU:   Understand these instructions.  Will watch your condition.  Will get help right away if you are not doing well or get worse. Document Released: 07/13/2005 Document Revised: 10/08/2013 Document Reviewed: 05/08/2008 [facility] Patient Information 2015 [place], [place]. This information is not intended to replace advice given to you by your health care provider. Make sure you discuss any questions you have with your health care provider. Strep Throat Strep throat is an infection of the throat caused by a bacteria named Streptococcus pyogenes. Your health care provider may call the infection streptococcal "tonsillitis" or "pharyngitis" depending on whether there are signs of inflammation in the tonsils or back of the throat. Strep throat is most common in children aged 5-15 years during the cold months of the year, but it can occur in people of any age during any season. This infection is spread from person to person (contagious) through coughing, sneezing, or other close contact. SIGNS AND SYMPTOMS   Fever or chills.  Painful, swollen, red tonsils or throat.  Pain or difficulty when swallowing.  White or yellow spots on the tonsils or throat.  Swollen, tender lymph nodes or "glands" of the neck or under the jaw.  Red rash all over the body (rare). DIAGNOSIS  Many different infections can cause the same symptoms. A test must be done to confirm the diagnosis so the right treatment can be given. A "rapid strep test" can help your health care provider make the diagnosis in a few minutes. If this test is not available, a light swab of the infected area can be used for a throat culture test. If a throat culture test is done, results are usually available in a day or two. TREATMENT  Strep throat is treated with antibiotic medicine. HOME CARE INSTRUCTIONS   Gargle with 1 tsp of salt in 1 cup of warm water, 3-4 times per day or as needed for  comfort.  Family members who also have a sore throat or fever should be tested for strep throat and treated with antibiotics if they have the strep infection.  Make sure everyone in your household washes their hands well.  Do not share food, drinking cups, or personal items that could cause the infection to spread to others.  You may need to eat a soft food diet until your sore throat gets better.  Drink enough water and fluids to keep your urine clear or pale yellow. This will help prevent dehydration.  Get plenty of rest.  Stay home from school, day care, or work until you have been on antibiotics for 24 hours.  Take medicines only as directed by your health care provider.  Take your antibiotic medicine as directed by your health care provider. Finish it even if you start to feel better. SEEK MEDICAL CARE IF:   The glands in your neck continue to enlarge.  You develop a rash, cough, or earache.  You cough up [place], yellow-brown, or bloody sputum.  You have pain or discomfort not controlled by medicines.  Your problems seem to be getting worse rather than better.  You have a fever. SEEK  IMMEDIATE MEDICAL CARE IF:   You develop any new symptoms such as vomiting, severe headache, stiff or painful neck, chest pain, shortness of breath, or trouble swallowing.  You develop severe throat pain, drooling, or changes in your voice.  You develop swelling of the neck, or the skin on the neck becomes red and tender.  You develop signs of dehydration, such as fatigue, dry mouth, and decreased urination.  You become increasingly sleepy, or you cannot wake up completely. MAKE SURE YOU:  Understand these instructions.  Will watch your condition.  Will get help right away if you are not doing well or get worse. Document Released: 09/30/2000 Document Revised: 02/17/2014 Document Reviewed: 12/02/2010 [facility] Patient Information 2015 [place], [place]. This information is not intended  to replace advice given to you by your health care provider. Make sure you discuss any questions you have with your health care provider.

## 2014-10-14 NOTE — ED Notes (Signed)
Reports sore throat, cough, left ear pain x 1 day.

## 2014-10-14 NOTE — ED Notes (Signed)
MD at bedside. 

## 2015-01-16 ENCOUNTER — Other Ambulatory Visit (HOSPITAL_COMMUNITY)
Admission: RE | Admit: 2015-01-16 | Discharge: 2015-01-16 | Disposition: A | Discharge status: Home / Self Care | Payer: Managed Care, Other (non HMO) | Source: Ambulatory Visit | Attending: Obstetrics & Gynecology | Admitting: Obstetrics & Gynecology

## 2015-01-16 ENCOUNTER — Other Ambulatory Visit: Payer: Self-pay | Admitting: Obstetrics & Gynecology

## 2015-01-16 DIAGNOSIS — Z1151 Encounter for screening for human papillomavirus (HPV): Secondary | ICD-10-CM | POA: Diagnosis present

## 2015-01-16 DIAGNOSIS — Z113 Encounter for screening for infections with a predominantly sexual mode of transmission: Secondary | ICD-10-CM | POA: Diagnosis present

## 2015-01-16 DIAGNOSIS — Z01411 Encounter for gynecological examination (general) (routine) with abnormal findings: Secondary | ICD-10-CM | POA: Insufficient documentation

## 2015-01-19 LAB — CYTOLOGY - PAP

## 2015-01-29 ENCOUNTER — Other Ambulatory Visit: Payer: Self-pay | Admitting: Obstetrics & Gynecology

## 2015-03-09 ENCOUNTER — Other Ambulatory Visit: Payer: Self-pay | Admitting: Family

## 2015-03-09 DIAGNOSIS — R1013 Epigastric pain: Secondary | ICD-10-CM

## 2015-03-18 ENCOUNTER — Encounter: Payer: Self-pay | Admitting: Neurology

## 2015-03-18 ENCOUNTER — Ambulatory Visit (INDEPENDENT_AMBULATORY_CARE_PROVIDER_SITE_OTHER): Payer: Managed Care, Other (non HMO) | Admitting: Neurology

## 2015-03-18 VITALS — BP 120/86 | HR 80 | Ht 61.0 in | Wt 201.2 lb

## 2015-03-18 DIAGNOSIS — R269 Unspecified abnormalities of gait and mobility: Secondary | ICD-10-CM | POA: Diagnosis not present

## 2015-03-18 DIAGNOSIS — R202 Paresthesia of skin: Secondary | ICD-10-CM

## 2015-03-18 DIAGNOSIS — R209 Unspecified disturbances of skin sensation: Secondary | ICD-10-CM | POA: Diagnosis not present

## 2015-03-18 HISTORY — DX: Paresthesia of skin: R20.2

## 2015-03-18 LAB — TSH: TSH: 1.13 u[IU]/mL (ref 0.350–4.500)

## 2015-03-18 LAB — VITAMIN B12: Vitamin B-12: 574 pg/mL (ref 211–911)

## 2015-03-18 MED ORDER — GABAPENTIN 100 MG PO CAPS: ORAL_CAPSULE | ORAL | Status: DC

## 2015-03-18 NOTE — Progress Notes (Signed)
[facility] Clinic Note - Initial Visit   Date: 03/18/2015   Audrey [name] MRN: [id number] DOB: 05/29/1976   Dear Dr. Chapman Fitch:  Thank you for your kind referral of Audrey [name] for consultation of bilateral feet and ankle numbness. Although her history is well known to you, please allow [place] to reiterate it for the purpose of our medical record. The patient was accompanied to the clinic by self.    History of Present Illness: Audrey [name] is a 39 y.o. right-handed [place] American female with asthma, hypertension, and seasonal allergies presenting for evaluation of bilateral feel paresthesias.  Starting ~ 2010, she also noticed numbness and pins and needle sensation of the both feet, worse on the soles. Symptoms are constant and improved when she wears tennis shoes.  It is worse when she is walking.  No alleviating factors. Denies any problems with balance, weakness, of falls. There is no associated back pain.  Around the same time, she also developed cramps of her hands, left tingling of the arm, and dropping objects. She endorses always being clumsy.  She has previously been evaluated by neurology in the past for transient left sided weakness, as well as the above mentioned symptoms.  MRI brain (2014) returned normal.  No prior NCS/EMG.  Out-side paper records, electronic medical record, and images have been reviewed where available and summarized as:  MRI brain wo contrast 05/14/2013:  Normal CT cervical spine 05/13/2013: Negative CT cervical spine   Past Medical History  Diagnosis Date  . Asthma   . Abortion history   . Migraine headache   . Anemia   . Hypertension   . Fibroid tumor   . Shingles     Past Surgical History  Procedure Laterality Date  . Cesarean section    . Cystectomy      breast     Medications:  Current Outpatient Prescriptions on File Prior to Visit  Medication Sig Dispense Refill  . albuterol (PROVENTIL HFA;VENTOLIN HFA)  108 (90 BASE) MCG/ACT inhaler Inhale 2 puffs into the lungs 2 (two) times daily. For shortness of breath     No current facility-administered medications on file prior to visit.    Allergies:  Allergies  Allergen Reactions  . Penicillins Other (See Comments), Itching and Rash    Eye swelling  . Shellfish Allergy Anaphylaxis  . Lisinopril Other (See Comments) and Swelling    Caused lips to swell  . Advair Diskus [Fluticasone-Salmeterol] Nausea Only    Weakness headaches  . Erythromycin Other (See Comments)    Made eye infection worse   . Nyamyc [Nystatin] Other (See Comments)    Made eye infection worse  . Asa  [Aspirin] Other (See Comments)    Family History: Family History  Problem Relation Age of Onset  . Hypertension Mother   . Non-Hodgkin's lymphoma Mother     Deceased, 3  . Cancer Maternal Grandfather   . Cancer Paternal Grandfather   . Hypertension Father     Living, 64  . High Cholesterol Father   . Heart disease Maternal Uncle   . Heart disease Paternal Uncle   . Healthy Brother   . Asthma Son   . Asthma Daughter     Social History: History   Social History  . Marital Status: Single    Spouse Name: N/A  . Number of Children: N/A  . Years of Education: N/A   Occupational History  . Not on file.   Social History  Main Topics  . Smoking status: Never Smoker   . Smokeless tobacco: Never Used  . Alcohol Use: No  . Drug Use: No  . Sexual Activity: Not on file   Other Topics Concern  . Not on file   Social History Narrative   Lives with children and boyfriend in a one story home.     Works for [employer].     Education: BS    Review of Systems:  CONSTITUTIONAL: No fevers, chills, night sweats, or weight loss.   EYES: No visual changes or eye pain ENT: No hearing changes.  No history of nose bleeds.   RESPIRATORY: No cough, wheezing and shortness of breath.   CARDIOVASCULAR: Negative for chest pain, and palpitations.   GI: Negative for abdominal  discomfort, blood in stools or black stools.  No recent change in bowel habits.   GU:  No history of incontinence.   MUSCLOSKELETAL: +history of joint pain or swelling.  No myalgias.   SKIN: Negative for lesions, rash, and itching.   HEMATOLOGY/ONCOLOGY: Negative for prolonged bleeding, bruising easily, and swollen nodes.  No history of cancer.   ENDOCRINE: Negative for cold or heat intolerance, polydipsia or goiter.   PSYCH:  No depression or anxiety symptoms.   NEURO: As Above.   Vital Signs:  BP 120/86 mmHg  Pulse 80  Ht '5\' 1"'  (1.549 m)  Wt 201 lb 4 oz (91.286 kg)  BMI 38.05 kg/m2  SpO2 99%    General Medical Exam:   General:  Well appearing, comfortable.   Eyes/ENT: see cranial nerve examination.   Neck: No masses appreciated.  Full range of motion without tenderness.  No carotid bruits. Respiratory:  Clear to auscultation, good air entry bilaterally.   Cardiac:  Regular rate and rhythm, no murmur.   Extremities:  No deformities, edema, or skin discoloration.  Skin:  No rashes or lesions.  Neurological Exam: MENTAL STATUS including orientation to time, place, person, recent and remote memory, attention span and concentration, language, and fund of knowledge is normal.  Speech is not dysarthric.  CRANIAL NERVES: II:  No visual field defects.  Unremarkable fundi.   III-IV-VI: Pupils equal round and reactive to light.  Normal conjugate, extra-ocular eye movements in all directions of gaze.  No nystagmus.  No ptosis.   V:  Normal facial sensation.   VII:  Normal facial symmetry and movements.  No pathologic facial reflexes.  VIII:  Normal hearing and vestibular function.   IX-X:  Normal palatal movement.   XI:  Normal shoulder shrug and head rotation.   XII:  Normal tongue strength and range of motion, no deviation or fasciculation.  MOTOR:  No atrophy, fasciculations or abnormal movements.  No pronator drift.  Tone is normal.    Right Upper Extremity:    Left Upper  Extremity:    Deltoid  5/5   Deltoid  5/5   Biceps  5/5   Biceps  5/5   Triceps  5/5   Triceps  5/5   Wrist extensors  5/5   Wrist extensors  5/5   Wrist flexors  5/5   Wrist flexors  5/5   Finger extensors  5/5   Finger extensors  5/5   Finger flexors  5/5   Finger flexors  5/5   Dorsal interossei  5/5   Dorsal interossei  5/5   Abductor pollicis  5/5   Abductor pollicis  5/5   Tone (Ashworth scale)  0  Tone (Ashworth scale)  0   Right Lower Extremity:    Left Lower Extremity:    Hip flexors  5/5   Hip flexors  5/5   Hip extensors  5/5   Hip extensors  5/5   Knee flexors  5/5   Knee flexors  5/5   Knee extensors  5/5   Knee extensors  5/5   Dorsiflexors  5/5   Dorsiflexors  5/5   Plantarflexors  5/5   Plantarflexors  5/5   Toe extensors  5/5   Toe extensors  5/5   Toe flexors  5/5   Toe flexors  5/5   Tone (Ashworth scale)  0  Tone (Ashworth scale)  0   MSRs:  Right                                                                 Left brachioradialis 2+  brachioradialis 2+  biceps 2+  biceps 2+  triceps 2+  triceps 2+  patellar 2+  patellar 2+  ankle jerk 2+  ankle jerk 2+  Hoffman no  Hoffman no  plantar response down  plantar response down   SENSORY:  Normal and symmetric perception of light touch, pinprick, vibration, and proprioception.  Romberg's sign absent.   COORDINATION/GAIT: Normal finger-to- nose-finger and heel-to-shin.  Intact rapid alternating movements bilaterally.  Able to rise from a chair without using arms.  Gait is somewhat antalgic, slow, and narrow based. Tandem and stressed gait intact.    IMPRESSION: Audrey [name] is a 39 year-old female presenting for evaluation of bilateral feet paresthesias.  There is no focal findings on exam to suggest early neuropathy or S1 radiculopathy. Therefore, I will proceed with electrodiagnostic testing of the lower extremities to characterize the nature of her symptoms.  I will also screen for treatable causes of neuropathy  for completeness.  Because of her painful paresthesias, I will start her on gabapentin and up-titrate as needed.   PLAN/RECOMMENDATIONS:  1.  Check vitamin B12, TSH, 2hr glucose tolerance testing, copper, ceruloplasmin, SPEP/UPEP with IFE, ESR 2.  EMG of the legs 3.  Start gabapentin 139m at bedtime and titrate to 3065mat bedtime over three weeks.  Common side effects discussed. 4.  Going forward, NCS/EMG of the upper extremities may also need to be performed 5.  Return to clinic in 3 months.   The duration of this appointment visit was 35 minutes of face-to-face time with the patient.  Greater than 50% of this time was spent in counseling, explanation of diagnosis, planning of further management, and coordination of care.   Thank you for allowing me to participate in patient's care.  If I can answer any additional questions, I would be pleased to do so.    Sincerely,    Alfretta Pinch K. PaPosey ProntoDO

## 2015-03-18 NOTE — Patient Instructions (Signed)
1.  Check blood work 2.  EMG of the legs 3.  Start gabapentin 100mg  1 tab at bedtime for one week, then increase to 2 tablets at bedtime for one week, and if tolerating, continue 3 tablets at bedtime 4.  Return to 2 months

## 2015-03-18 NOTE — Progress Notes (Signed)
Note sent

## 2015-03-19 LAB — SEDIMENTATION RATE: Sed Rate: 16 mm/hr (ref 0–20)

## 2015-03-20 ENCOUNTER — Other Ambulatory Visit: Payer: Managed Care, Other (non HMO)

## 2015-03-20 DIAGNOSIS — R269 Unspecified abnormalities of gait and mobility: Secondary | ICD-10-CM

## 2015-03-20 DIAGNOSIS — R202 Paresthesia of skin: Secondary | ICD-10-CM

## 2015-03-20 DIAGNOSIS — R209 Unspecified disturbances of skin sensation: Secondary | ICD-10-CM

## 2015-03-20 LAB — SPEP & IFE WITH QIG
Albumin ELP: 3.5 g/dL — ABNORMAL LOW (ref 3.8–4.8)
Alpha-1-Globulin: 0.5 g/dL — ABNORMAL HIGH (ref 0.2–0.3)
Alpha-2-Globulin: 0.8 g/dL (ref 0.5–0.9)
Beta 2: 0.4 g/dL (ref 0.2–0.5)
Beta Globulin: 0.5 g/dL (ref 0.4–0.6)
Gamma Globulin: 1.5 g/dL (ref 0.8–1.7)
IgA: 187 mg/dL (ref 69–380)
IgG (Immunoglobin G), Serum: 1740 mg/dL — ABNORMAL HIGH (ref 690–1700)
IgM, Serum: 91 mg/dL (ref 52–322)
Total Protein, Serum Electrophoresis: 7.1 g/dL (ref 6.1–8.1)

## 2015-03-20 LAB — UIFE/LIGHT CHAINS/TP QN, 24-HR UR
Albumin, U: DETECTED
Alpha 1, Urine: DETECTED — AB
Alpha 2, Urine: DETECTED — AB
Beta, Urine: DETECTED — AB
Gamma Globulin, Urine: DETECTED — AB
Total Protein, Urine: 11 mg/dL (ref 5–24)

## 2015-03-20 LAB — CERULOPLASMIN: Ceruloplasmin: 32 mg/dL (ref 18–53)

## 2015-03-20 LAB — GLUCOSE TOLERANCE, 2 HOURS
Glucose, 1 Hour GTT: 126 mg/dL
Glucose, 2 hour: 123 mg/dL
Glucose, Fasting: 105 mg/dL — ABNORMAL HIGH (ref 70–99)

## 2015-03-20 LAB — COPPER, SERUM: Copper: 144 ug/dL (ref 70–175)

## 2015-03-24 ENCOUNTER — Telehealth: Payer: Self-pay | Admitting: Neurology

## 2015-03-24 NOTE — Telephone Encounter (Signed)
Done.  See previous note. 

## 2015-03-24 NOTE — Telephone Encounter (Signed)
Patient states that she is returning a call and she thinks it is about test results please call her at [phone] and you can leave a message on her phone

## 2015-03-31 ENCOUNTER — Ambulatory Visit (INDEPENDENT_AMBULATORY_CARE_PROVIDER_SITE_OTHER): Payer: Managed Care, Other (non HMO) | Admitting: Neurology

## 2015-03-31 DIAGNOSIS — R209 Unspecified disturbances of skin sensation: Secondary | ICD-10-CM

## 2015-03-31 DIAGNOSIS — R269 Unspecified abnormalities of gait and mobility: Secondary | ICD-10-CM

## 2015-03-31 DIAGNOSIS — R202 Paresthesia of skin: Secondary | ICD-10-CM

## 2015-03-31 NOTE — Procedures (Signed)
[facility]  [place], [place]  [place], [place] [address] Tel: [phone] Fax:  [phone] Test Date:  03/31/2015  Patient: Audrey [name] DOB: [birth year] Physician: Narda Amber  Sex: Female Height: 5\' 1"  Ref Phys: Bradley Ferris, M.D.  ID#: [id number] Temp: 33.8C Technician: M. Dean   Patient Complaints: This is a 39 year old female presenting for evaluation of bilateral feet paresthesias.   NCV & EMG Findings: Extensive electrodiagnostic testing of the left lower extremity and additional studies of the right shows:  1. Bilateral sural and superficial peroneal sensory responses are within normal limits. 2. Bilateral peroneal motor responses and right tibial motor response are within normal limits. Left tibial motor response is mildly reduced, these findings are of uncertain clinical significance in isolation. 3. There is no evidence of active or chronic motor axon loss changes affecting any of the tested muscles.  Impression: This is a normal study of the lower extremities. In particular, there is no evidence of a generalized sensorimotor polyneuropathy or lumbosacral radiculopathy.   ___________________________ Narda Amber    Nerve Conduction Studies Anti Sensory Summary Table   Stim Site NR Peak (ms) Norm Peak (ms) P-T Amp (V) Norm P-T Amp  Left Sup Peroneal Anti Sensory (Ant Lat Mall)  33.8C  12 cm    2.9 <4.5 12.9 >5  Right Sup Peroneal Anti Sensory (Ant Lat Mall)  33.8C  12 cm    2.7 <4.5 11.9 >5  Left Sural Anti Sensory (Lat Mall)  33.8C  Calf    2.5 <4.5 15.6 >5  Right Sural Anti Sensory (Lat Mall)  33.8C  Calf    2.8 <4.5 10.7 >5   Motor Summary Table   Stim Site NR Onset (ms) Norm Onset (ms) O-P Amp (mV) Norm O-P Amp Site1 Site2 Delta-0 (ms) Dist (cm) Vel (m/s) Norm Vel (m/s)  Left Peroneal Motor (Ext Dig Brev)  33.8C  Ankle    3.1 <5.5 5.8 >3 B Fib Ankle 5.8 30.0 52 >40  B Fib    8.9  5.1  Poplt B Fib 1.6 10.0 63 >40  Poplt    10.5   4.7         Right Peroneal Motor (Ext Dig Brev)  33.8C  Ankle    3.1 <5.5 3.9 >3 B Fib Ankle 5.8 32.0 55 >40  B Fib    8.9  3.4  Poplt B Fib 1.5 10.0 67 >40  Poplt    10.4  3.2         Left Tibial Motor (Abd Hall Brev)  33.8C  Ankle    2.5 <6.0 7.4 >8 Knee Ankle 7.6 34.0 45 >40  Knee    10.1  4.8         Right Tibial Motor (Abd Hall Brev)  33.8C  Ankle    3.0 <6.0 8.4 >8 Knee Ankle 7.0 37.0 53 >40  Knee    10.0  7.3          H Reflex Studies   NR H-Lat (ms) Lat Norm (ms) L-R H-Lat (ms) M-Lat (ms) HLat-MLat (ms)  Left Tibial (Gastroc)  33.8C     26.80 <35 0.00 4.35 22.45  Right Tibial (Gastroc)  33.8C     26.80 <35 0.00 3.81 22.99   EMG   Side Muscle Ins Act Fibs Psw Fasc Number Recrt Dur Dur. Amp Amp. Poly Poly. Comment  Left AntTibialis Nml Nml Nml Nml Nml Nml Nml Nml Nml Nml Nml Nml N/A  Left Gastroc  Nml Nml Nml Nml Nml Nml Nml Nml Nml Nml Nml Nml N/A  Left Flex Dig Long Nml Nml Nml Nml Nml Nml Nml Nml Nml Nml Nml Nml N/A  Left AbdHallucis Nml Nml Nml Nml Nml Nml Nml Nml Nml Nml Nml Nml N/A  Left RectFemoris Nml Nml Nml Nml Nml Nml Nml Nml Nml Nml Nml Nml N/A  Left GluteusMed Nml Nml Nml Nml Nml Nml Nml Nml Nml Nml Nml Nml N/A  Right AntTibialis Nml Nml Nml Nml Nml Nml Nml Nml Nml Nml Nml Nml N/A  Right Gastroc Nml Nml Nml Nml Nml Nml Nml Nml Nml Nml Nml Nml N/A      Waveforms:

## 2015-04-03 ENCOUNTER — Other Ambulatory Visit: Payer: Self-pay

## 2015-04-03 ENCOUNTER — Ambulatory Visit
Admission: RE | Admit: 2015-04-03 | Discharge: 2015-04-03 | Disposition: A | Discharge status: Home / Self Care | Payer: Managed Care, Other (non HMO) | Source: Ambulatory Visit | Attending: Family | Admitting: Family

## 2015-04-03 ENCOUNTER — Ambulatory Visit
Admission: RE | Admit: 2015-04-03 | Discharge: 2015-04-03 | Disposition: A | Discharge status: Home / Self Care | Payer: Managed Care, Other (non HMO) | Source: Ambulatory Visit

## 2015-04-03 DIAGNOSIS — Z1231 Encounter for screening mammogram for malignant neoplasm of breast: Secondary | ICD-10-CM

## 2015-04-03 DIAGNOSIS — R1013 Epigastric pain: Secondary | ICD-10-CM

## 2015-05-21 ENCOUNTER — Ambulatory Visit (INDEPENDENT_AMBULATORY_CARE_PROVIDER_SITE_OTHER): Payer: Managed Care, Other (non HMO) | Admitting: Neurology

## 2015-05-21 ENCOUNTER — Encounter: Payer: Self-pay | Admitting: Neurology

## 2015-05-21 VITALS — BP 110/78 | HR 86 | Ht 61.0 in | Wt 198.6 lb

## 2015-05-21 DIAGNOSIS — R202 Paresthesia of skin: Secondary | ICD-10-CM | POA: Diagnosis not present

## 2015-05-21 NOTE — Progress Notes (Signed)
Follow-up Visit   Date: 05/21/2015    Audrey [name] MRN: [id number] DOB: 1975/11/07   Interim History: Audrey [name] is a 39 y.o. 39 y.o. right-handed African American female with asthma, hypertension, and seasonal allergies returning to the clinic for follow-up of bilateral feel paresthesias.  The patient was accompanied to the clinic by self.  History of present illness: Starting ~ 2010, she also noticed numbness and pins and needle sensation of the both feet, worse on the soles. Symptoms are constant and improved when she wears tennis shoes. It is worse when she is walking. No alleviating factors. Denies any problems with balance, weakness, of falls. There is no associated back pain. Around the same time, she also developed cramps of her hands, left tingling of the arm, and dropping objects. She endorses always being clumsy. She has previously been evaluated by neurology in the past for transient left sided weakness, as well as the above mentioned symptoms. MRI brain (2014) returned normal.   UPDATE 05/21/2015:  She has noticed that her feet paresthesias is improved if she takes her HCTZ.  She also started wait watchers and has lost 3lb since her last visit.  No new complaints today.   Medications:  Current Outpatient Prescriptions on File Prior to Visit  Medication Sig Dispense Refill  . albuterol (PROVENTIL HFA;VENTOLIN HFA) 108 (90 BASE) MCG/ACT inhaler Inhale 2 puffs into the lungs 2 (two) times daily. For shortness of breath    . hydrochlorothiazide (HYDRODIURIL) 25 MG tablet Take 25 mg by mouth daily.  0   No current facility-administered medications on file prior to visit.    Allergies:  Allergies  Allergen Reactions  . Penicillins Other (See Comments), Itching and Rash    Eye swelling  . Shellfish Allergy Anaphylaxis  . Lisinopril Other (See Comments) and Swelling    Caused lips to swell  . Advair Diskus [Fluticasone-Salmeterol] Nausea Only    Weakness  headaches  . Erythromycin Other (See Comments)    Made eye infection worse   . Nyamyc [Nystatin] Other (See Comments)    Made eye infection worse  . Asa  [Aspirin] Other (See Comments)    Review of Systems:  CONSTITUTIONAL: No fevers, chills, night sweats, +weight loss.  EYES: No visual changes or eye pain ENT: No hearing changes.  No history of nose bleeds.   RESPIRATORY: No cough, wheezing and shortness of breath.   CARDIOVASCULAR: Negative for chest pain, and palpitations.   GI: Negative for abdominal discomfort, blood in stools or black stools.  No recent change in bowel habits.   GU:  No history of incontinence.   MUSCLOSKELETAL: No history of joint pain or swelling.  No myalgias.   SKIN: Negative for lesions, rash, and itching.   ENDOCRINE: Negative for cold or heat intolerance, polydipsia or goiter.   PSYCH:  No depression or anxiety symptoms.   NEURO: As Above.   Vital Signs:  BP 110/78 mmHg  Pulse 86  Ht 5' 1" (1.549 m)  Wt 198 lb 9 oz (90.067 kg)  BMI 37.54 kg/m2  SpO2 99%  Neurological Exam: MENTAL STATUS including orientation to time, place, person, recent and remote memory, attention span and concentration, language, and fund of knowledge is normal.  Speech is not dysarthric.  CRANIAL NERVES:  Pupils equal round and reactive to light.  Normal conjugate, extra-ocular eye movements in all directions of gaze.  No ptosis. Normal facial sensation.  Face is symmetric. Palate elevates symmetrically.  Tongue is  midline.  MOTOR:  Motor strength is 5/5 in all extremities.  No pronator drift.  Tone is normal.  Very mild intention tremor of the right hand.  MSRs:  Reflexes are 2+/4 throughout.  SENSORY:  Intact to vibration and temperature.  COORDINATION/GAIT:   Gait narrow based and stable.   Data: Labs 03/18/2015:  ESR 16, vitamin B12 574, TSH 1.130, copper 144, ceruloplasmin 32, SPEP/UPEP with IFE no M protein, glucose tolerance testing 105* fasting (mild elevation),  1hr 126, 2hr 123  MRI brain wo contrast 05/14/2013:  Normal  CT cervical spine 05/13/2013: Negative CT cervical spine  EMG 03/31/2015:  This is a normal study of the lower extremities. In particular, there is no evidence of a generalized sensorimotor polyneuropathy or lumbosacral radiculopathy.   IMPRESSION/PLAN: 1.  Bilateral feet paresthesias, resolved.  Patient has noticed symptoms correlate to when she is taking her HCTZ and is worse when feet are swollen.  Encouraged her to use compression stockings are needed 2.  Benign essential tremor, very mild. No need for medication at this time. 3.  Impaired glucose tolerance based on mildly positive glucose tolerance test.  I will share these results with her PCP and have her follow-up 4.  Obesity, praised her for her weight loss and encouraged her to continue with weight watchers program  Return to clinic as needed   The duration of this appointment visit was 20 minutes of face-to-face time with the patient.  Greater than 50% of this time was spent in counseling, explanation of diagnosis, planning of further management, and coordination of care.   Thank you for allowing me to participate in patient's care.  If I can answer any additional questions, I would be pleased to do so.    Sincerely,    Donika K. Patel, DO    

## 2015-05-21 NOTE — Progress Notes (Signed)
Note and labs faxed

## 2015-08-26 ENCOUNTER — Encounter: Payer: Managed Care, Other (non HMO) | Admitting: Podiatry

## 2015-08-26 ENCOUNTER — Ambulatory Visit: Payer: Self-pay

## 2015-08-26 DIAGNOSIS — M79673 Pain in unspecified foot: Secondary | ICD-10-CM

## 2015-09-19 ENCOUNTER — Emergency Department (HOSPITAL_COMMUNITY): Payer: Managed Care, Other (non HMO)

## 2015-09-19 ENCOUNTER — Emergency Department (HOSPITAL_COMMUNITY)
Admission: EM | Admit: 2015-09-19 | Discharge: 2015-09-19 | Disposition: A | Discharge status: Home / Self Care | Payer: Managed Care, Other (non HMO) | Attending: Emergency Medicine | Admitting: Emergency Medicine

## 2015-09-19 ENCOUNTER — Encounter (HOSPITAL_COMMUNITY): Payer: Self-pay | Admitting: Emergency Medicine

## 2015-09-19 DIAGNOSIS — Z880 Allergy status to penicillin: Secondary | ICD-10-CM | POA: Insufficient documentation

## 2015-09-19 DIAGNOSIS — J45909 Unspecified asthma, uncomplicated: Secondary | ICD-10-CM

## 2015-09-19 DIAGNOSIS — Z86018 Personal history of other benign neoplasm: Secondary | ICD-10-CM | POA: Diagnosis not present

## 2015-09-19 DIAGNOSIS — I10 Essential (primary) hypertension: Secondary | ICD-10-CM | POA: Insufficient documentation

## 2015-09-19 DIAGNOSIS — Z8619 Personal history of other infectious and parasitic diseases: Secondary | ICD-10-CM | POA: Insufficient documentation

## 2015-09-19 DIAGNOSIS — Z8679 Personal history of other diseases of the circulatory system: Secondary | ICD-10-CM | POA: Diagnosis not present

## 2015-09-19 DIAGNOSIS — J45901 Unspecified asthma with (acute) exacerbation: Secondary | ICD-10-CM | POA: Insufficient documentation

## 2015-09-19 DIAGNOSIS — R058 Other specified cough: Secondary | ICD-10-CM

## 2015-09-19 DIAGNOSIS — R0602 Shortness of breath: Secondary | ICD-10-CM | POA: Diagnosis present

## 2015-09-19 DIAGNOSIS — Z79899 Other long term (current) drug therapy: Secondary | ICD-10-CM | POA: Insufficient documentation

## 2015-09-19 DIAGNOSIS — Z862 Personal history of diseases of the blood and blood-forming organs and certain disorders involving the immune mechanism: Secondary | ICD-10-CM | POA: Diagnosis not present

## 2015-09-19 DIAGNOSIS — R05 Cough: Secondary | ICD-10-CM

## 2015-09-19 MED ORDER — PREDNISONE 10 MG PO TABS: 60.0000 mg | ORAL_TABLET | Freq: Every day | ORAL | Status: DC

## 2015-09-19 MED ORDER — ALBUTEROL SULFATE HFA 108 (90 BASE) MCG/ACT IN AERS
2.0000 | INHALATION_SPRAY | RESPIRATORY_TRACT | Status: DC
Start: 2015-09-19 — End: 2015-09-19
  Administered 2015-09-19: 2 via RESPIRATORY_TRACT
  Filled 2015-09-19: qty 6.7

## 2015-09-19 MED ORDER — PREDNISONE 20 MG PO TABS
60.0000 mg | ORAL_TABLET | Freq: Once | ORAL | Status: AC
  Administered 2015-09-19: 60 mg via ORAL
  Filled 2015-09-19: qty 3

## 2015-09-19 MED ORDER — ALBUTEROL SULFATE (2.5 MG/3ML) 0.083% IN NEBU
INHALATION_SOLUTION | RESPIRATORY_TRACT | Status: DC
Start: 2015-09-19 — End: 2015-09-19
  Filled 2015-09-19: qty 6

## 2015-09-19 MED ORDER — ALBUTEROL SULFATE (2.5 MG/3ML) 0.083% IN NEBU
5.0000 mg | INHALATION_SOLUTION | Freq: Once | RESPIRATORY_TRACT | Status: AC
  Administered 2015-09-19: 5 mg via RESPIRATORY_TRACT

## 2015-09-19 MED ORDER — DOXYCYCLINE HYCLATE 100 MG PO CAPS: 100.0000 mg | ORAL_CAPSULE | Freq: Two times a day (BID) | ORAL | Status: DC

## 2015-09-19 NOTE — ED Notes (Signed)
Pt stable, ambulatory, states understanding of discharge instructions 

## 2015-09-19 NOTE — Discharge Instructions (Signed)

## 2015-09-19 NOTE — ED Notes (Signed)
Pt. reports asthma attack with productive cough /chest congestion and nasal congestion onset this week unrelieved by MDI , denies fever or chills.

## 2015-09-19 NOTE — ED Provider Notes (Signed)
CSN: [id number]     Arrival date & time 09/19/15  0017 History   By signing my name below, I, Jolayne Panther, attest that this documentation has been prepared under the direction and in the presence of Jola Schmidt, MD. Electronically Signed: Jolayne Panther, [place]. 09/19/2015. 2:22 AM.     Chief Complaint  Patient presents with  . Asthma    The history is provided by the patient. No language interpreter was used.    HPI Comments: Audrey [name] is a 39 y.o. female with a h/o asthma who presents to the Emergency Department complaining of SOB, productive cough and mucous for the past two weeks as well as nausea and post-tussive emesis onset a week and a half ago. Pt reports she went from having chest pain to having a cold, mucous, and trouble swallowing which then led to a productive cough and post-tussive vomiting. She also reports subjective fever, chills, and eye pain when she tries to look upward. Pt reports that her asthma is worse at night. She states that she has been using her inhlaer and nebulizer at home with little relief temporarily. She denies diarrhea. Pt states that she has successfully treated her asthma symptoms with Advair in the past but reports that she is now allergic to Advair.  PCP: Dr. Chapman Fitch  Past Medical History  Diagnosis Date  . Asthma   . Abortion history   . Migraine headache   . Anemia   . Hypertension   . Fibroid tumor   . Shingles    Past Surgical History  Procedure Laterality Date  . Cesarean section    . Cystectomy      breast   Family History  Problem Relation Age of Onset  . Hypertension Mother   . Non-Hodgkin's lymphoma Mother     Deceased, 18  . Cancer Maternal Grandfather   . Cancer Paternal Grandfather   . Hypertension Father     Living, 87  . High Cholesterol Father   . Heart disease Maternal Uncle   . Heart disease Paternal Uncle   . Healthy Brother   . Asthma Son   . Asthma Daughter    Social History  Substance Use  Topics  . Smoking status: Never Smoker   . Smokeless tobacco: Never Used  . Alcohol Use: No   OB History    No data available     Review of Systems A complete 10 system review of systems was obtained and all systems are negative except as noted in the HPI and PMH.    Allergies  Penicillins; Shellfish allergy; Lisinopril; Advair diskus; Erythromycin; Nyamyc; and Asa   Home Medications   Prior to Admission medications   Medication Sig Start Date End Date Taking? Authorizing Provider  albuterol (PROVENTIL HFA;VENTOLIN HFA) 108 (90 BASE) MCG/ACT inhaler Inhale 2 puffs into the lungs 2 (two) times daily. For shortness of breath   Yes Historical Provider, MD  albuterol (PROVENTIL) (2.5 MG/3ML) 0.083% nebulizer solution Take 2.5 mg by nebulization every 6 (six) hours as needed for wheezing or shortness of breath.   Yes Historical Provider, MD  hydrochlorothiazide (HYDRODIURIL) 25 MG tablet Take 25 mg by mouth daily. 02/25/15  Yes Historical Provider, MD   BP 142/96 mmHg  Pulse 97  Temp(Src) 98.4 F (36.9 C) (Oral)  Resp 16  Ht 5\' 1"  (1.549 m)  Wt 201 lb (91.173 kg)  BMI 38.00 kg/m2  SpO2 99%  LMP 08/21/2015 (Approximate) Physical Exam  Constitutional: She is  oriented to person, place, and time. She appears well-developed and well-nourished. No distress.  HENT:  Head: Normocephalic and atraumatic.  Eyes: EOM are normal.  Neck: Normal range of motion.  Cardiovascular: Normal rate, regular rhythm and normal heart sounds.   Pulmonary/Chest: Effort normal and breath sounds normal. She has no wheezes.  Abdominal: Soft. She exhibits no distension. There is no tenderness.  Musculoskeletal: Normal range of motion.  Neurological: She is alert and oriented to person, place, and time.  Skin: Skin is warm and dry.  Psychiatric: She has a normal mood and affect. Judgment normal.  Nursing note and vitals reviewed.   ED Course  Procedures  DIAGNOSTIC STUDIES:    Oxygen Saturation is 99%  on RA, normal by my interpretation.   COORDINATION OF CARE:  2:14 AM Will prescribe pt steroids and abx. Will refill pt's inhaler. Discussed treatment plan with pt at bedside and pt agreed to plan.   Labs Review Labs Reviewed - No data to display  Imaging Review Dg Chest 2 View  09/19/2015  CLINICAL DATA:  39 year old female with shortness of breath and productive cough EXAM: CHEST  2 VIEW COMPARISON:  Radiograph dated 10/14/2014 FINDINGS: The heart size and mediastinal contours are within normal limits. Both lungs are clear. A stable 5 mm nodular density in the right lower lung field is again noted, likely an old granuloma. The visualized skeletal structures are unremarkable. IMPRESSION: No active cardiopulmonary disease. Electronically Signed   By: Anner Crete M.D.   On: 09/19/2015 01:57   I have personally reviewed and evaluated these images as part of my medical decision-making.   EKG Interpretation None      MDM   Final diagnoses:  Productive cough  Asthma    Patient feels much better after bronchodilators in the emergency department.  Likely reactive airway disease with associated bronchospasm.  Discharge home in good condition.  Primary care follow-up.  I personally performed the services described in this documentation, which was scribed in my presence. The recorded information has been reviewed and is accurate.       Jola Schmidt, MD 09/19/15 [phone]

## 2015-10-18 HISTORY — PX: GANGLION CYST EXCISION: SHX1691

## 2015-10-18 HISTORY — PX: ENDOMETRIAL BIOPSY: SHX622

## 2016-01-15 NOTE — Progress Notes (Signed)
This encounter was created in error - please disregard.

## 2016-02-17 ENCOUNTER — Encounter (HOSPITAL_COMMUNITY): Payer: Self-pay | Admitting: Emergency Medicine

## 2016-02-17 ENCOUNTER — Ambulatory Visit (HOSPITAL_COMMUNITY)
Admission: EM | Admit: 2016-02-17 | Discharge: 2016-02-17 | Disposition: A | Discharge status: Home / Self Care | Payer: Managed Care, Other (non HMO) | Attending: Family Medicine | Admitting: Family Medicine

## 2016-02-17 DIAGNOSIS — L259 Unspecified contact dermatitis, unspecified cause: Secondary | ICD-10-CM

## 2016-02-17 MED ORDER — TRIAMCINOLONE ACETONIDE 0.1 % EX CREA: 1.0000 "application " | TOPICAL_CREAM | Freq: Two times a day (BID) | CUTANEOUS | Status: DC

## 2016-02-17 MED ORDER — PREDNISONE 20 MG PO TABS: ORAL_TABLET | ORAL | Status: DC

## 2016-02-17 NOTE — ED Provider Notes (Signed)
CSN: [id number]     Arrival date & time 02/17/16  1917 History   First MD Initiated Contact with Patient 02/17/16 2014     No chief complaint on file.  (Consider location/radiation/quality/duration/timing/severity/associated sxs/prior Treatment) HPI Comments: 40 year old female complaining of a sore, burning, itchy rash to her neck, upper most back and anterior neckline for 4 days. She is uncertain as to what may caused  to it. Denies swelling, cough, problems with breathing She does note that there are a few isolated itchy papules to the upper extremities.   Past Medical History  Diagnosis Date  . Asthma   . Abortion history   . Migraine headache   . Anemia   . Hypertension   . Fibroid tumor   . Shingles    Past Surgical History  Procedure Laterality Date  . Cesarean section    . Cystectomy      breast   Family History  Problem Relation Age of Onset  . Hypertension Mother   . Non-Hodgkin's lymphoma Mother     Deceased, 42  . Cancer Maternal Grandfather   . Cancer Paternal Grandfather   . Hypertension Father     Living, 37  . High Cholesterol Father   . Heart disease Maternal Uncle   . Heart disease Paternal Uncle   . Healthy Brother   . Asthma Son   . Asthma Daughter    Social History  Substance Use Topics  . Smoking status: Never Smoker   . Smokeless tobacco: Never Used  . Alcohol Use: No   OB History    No data available     Review of Systems  Constitutional: Negative.   HENT: Negative.   Respiratory: Negative.   Musculoskeletal: Negative.   Skin: Positive for rash.  Neurological: Negative.   All other systems reviewed and are negative.   Allergies  Penicillins; Shellfish allergy; Lisinopril; Advair diskus; Erythromycin; Nyamyc; and Asa   Home Medications   Prior to Admission medications   Medication Sig Start Date End Date Taking? Authorizing Provider  albuterol (PROVENTIL HFA;VENTOLIN HFA) 108 (90 BASE) MCG/ACT inhaler Inhale 2 puffs into the  lungs 2 (two) times daily. For shortness of breath    Historical Provider, MD  albuterol (PROVENTIL) (2.5 MG/3ML) 0.083% nebulizer solution Take 2.5 mg by nebulization every 6 (six) hours as needed for wheezing or shortness of breath.    Historical Provider, MD  doxycycline (VIBRAMYCIN) 100 MG capsule Take 1 capsule (100 mg total) by mouth 2 (two) times daily. 09/19/15   Jola Schmidt, MD  hydrochlorothiazide (HYDRODIURIL) 25 MG tablet Take 25 mg by mouth daily. 02/25/15   Historical Provider, MD  predniSONE (DELTASONE) 20 MG tablet Take 3 tabs po on first day, 2 tabs second day, 2 tabs third day, 1 tab fourth day, 1 tab 5th day. Take with food. 02/17/16   Janne Napoleon, NP  triamcinolone cream (KENALOG) 0.1 % Apply 1 application topically 2 (two) times daily. 02/17/16   Janne Napoleon, NP   Meds Ordered and Administered this Visit  Medications - No data to display  BP 139/88 mmHg  Pulse 75  Temp(Src) 97.2 F (36.2 C) (Oral)  SpO2 99% No data found.   Physical Exam  Constitutional: She is oriented to person, place, and time. She appears well-developed and well-nourished. No distress.  HENT:  Mouth/Throat: Oropharynx is clear and moist. No oropharyngeal exudate.  Eyes: Conjunctivae and EOM are normal.  Neck: Normal range of motion. Neck supple.  Cardiovascular: Normal rate.  Pulmonary/Chest: Effort normal.  Musculoskeletal: Normal range of motion.  Neurological: She is alert and oriented to person, place, and time. She exhibits normal muscle tone.  Skin: Skin is warm and dry. Rash noted.  Papular rash, flesh-colored encircling the neck. Involves portion of theupper most midback just below the neck as well as the the line of the anterior chest. Few lesions to the face and upper extremities.  Psychiatric: She has a normal mood and affect.  Nursing note and vitals reviewed.   ED Course  Procedures (including critical care time)  Labs Review Labs Reviewed - No data to display  Imaging  Review No results found.   Visual Acuity Review  Right Eye Distance:   Left Eye Distance:   Bilateral Distance:    Right Eye Near:   Left Eye Near:    Bilateral Near:         MDM   1. Contact dermatitis    May also apply benadryl cream Cold packs/ice Meds ordered this encounter  Medications  . triamcinolone cream (KENALOG) 0.1 %    Sig: Apply 1 application topically 2 (two) times daily.    Dispense:  30 g    Refill:  0    Order Specific Question:  Supervising Provider    Answer:  Billy Fischer [phone]  . predniSONE (DELTASONE) 20 MG tablet    Sig: Take 3 tabs po on first day, 2 tabs second day, 2 tabs third day, 1 tab fourth day, 1 tab 5th day. Take with food.    Dispense:  9 tablet    Refill:  0    Order Specific Question:  Supervising Provider    Answer:  Ihor Gully D [phone]   Take OTC Claritin or Zyrtec for itching.    Janne Napoleon, NP 02/17/16 2042

## 2016-02-17 NOTE — ED Notes (Signed)
Rash on neck and back.  Onset Saturday symptoms started.  [place].  No breathing issues

## 2016-02-17 NOTE — Discharge Instructions (Signed)
Contact Dermatitis May also apply benadryl cream Cold packs/ice Dermatitis is redness, soreness, and swelling (inflammation) of the skin. Contact dermatitis is a reaction to certain substances that touch the skin. You either touched something that irritated your skin, or you have allergies to something you touched.  HOME CARE  Skin Care  Moisturize your skin as needed.  Apply cool compresses to the affected areas.   Try taking a bath with:   Epsom salts. Follow the instructions on the package. You can get these at a pharmacy or grocery store.   Baking soda. Pour a small amount into the bath as told by your doctor.   Colloidal oatmeal. Follow the instructions on the package. You can get this at a pharmacy or grocery store.   Try applying baking soda paste to your skin. Stir water into baking soda until it looks like paste.  Do not scratch your skin.   Bathe less often.  Bathe in lukewarm water. Avoid using hot water.  Medicines  Take or apply over-the-counter and prescription medicines only as told by your doctor.   If you were prescribed an antibiotic medicine, take or apply your antibiotic as told by your doctor. Do not stop taking the antibiotic even if your condition starts to get better. General Instructions  Keep all follow-up visits as told by your doctor. This is important.   Avoid the substance that caused your reaction. If you do not know what caused it, keep a journal to try to track what caused it. Write down:   What you eat.   What cosmetic products you use.   What you drink.   What you wear in the affected area. This includes jewelry.   If you were given a bandage (dressing), take care of it as told by your doctor. This includes when to change and remove it.  GET HELP IF:   You do not get better with treatment.   Your condition gets worse.   You have signs of infection such  as:  Swelling.  Tenderness.  Redness.  Soreness.  Warmth.   You have a fever.   You have new symptoms.  GET HELP RIGHT AWAY IF:   You have a very bad headache.  You have neck pain.  Your neck is stiff.   You throw up (vomit).   You feel very sleepy.   You see red streaks coming from the affected area.   Your bone or joint underneath the affected area becomes painful after the skin has healed.   The affected area turns darker.   You have trouble breathing.    This information is not intended to replace advice given to you by your health care provider. Make sure you discuss any questions you have with your health care provider.   Document Released: 07/31/2009 Document Revised: 06/24/2015 Document Reviewed: 02/18/2015 Elsevier Interactive Patient Education [employer].

## 2016-05-03 ENCOUNTER — Encounter (HOSPITAL_COMMUNITY): Payer: Self-pay | Admitting: Emergency Medicine

## 2016-05-03 ENCOUNTER — Emergency Department (HOSPITAL_COMMUNITY)
Admission: EM | Admit: 2016-05-03 | Discharge: 2016-05-04 | Disposition: A | Discharge status: Home / Self Care | Payer: Managed Care, Other (non HMO) | Attending: Emergency Medicine | Admitting: Emergency Medicine

## 2016-05-03 ENCOUNTER — Emergency Department (HOSPITAL_COMMUNITY): Payer: Managed Care, Other (non HMO)

## 2016-05-03 DIAGNOSIS — R0602 Shortness of breath: Secondary | ICD-10-CM | POA: Insufficient documentation

## 2016-05-03 DIAGNOSIS — Z79899 Other long term (current) drug therapy: Secondary | ICD-10-CM | POA: Insufficient documentation

## 2016-05-03 DIAGNOSIS — R079 Chest pain, unspecified: Secondary | ICD-10-CM | POA: Diagnosis not present

## 2016-05-03 DIAGNOSIS — I10 Essential (primary) hypertension: Secondary | ICD-10-CM | POA: Diagnosis not present

## 2016-05-03 DIAGNOSIS — J45909 Unspecified asthma, uncomplicated: Secondary | ICD-10-CM | POA: Insufficient documentation

## 2016-05-03 LAB — CBC
HCT: 36.9 % (ref 36.0–46.0)
Hemoglobin: 12.4 g/dL (ref 12.0–15.0)
MCH: 28.7 pg (ref 26.0–34.0)
MCHC: 33.6 g/dL (ref 30.0–36.0)
MCV: 85.4 fL (ref 78.0–100.0)
Platelets: 274 10*3/uL (ref 150–400)
RBC: 4.32 MIL/uL (ref 3.87–5.11)
RDW: 13.5 % (ref 11.5–15.5)
WBC: 7.7 10*3/uL (ref 4.0–10.5)

## 2016-05-03 LAB — COMPREHENSIVE METABOLIC PANEL
ALT: 22 U/L (ref 14–54)
AST: 19 U/L (ref 15–41)
Albumin: 3.6 g/dL (ref 3.5–5.0)
Alkaline Phosphatase: 54 U/L (ref 38–126)
Anion gap: 7 (ref 5–15)
BUN: 18 mg/dL (ref 6–20)
CO2: 26 mmol/L (ref 22–32)
Calcium: 9.4 mg/dL (ref 8.9–10.3)
Chloride: 101 mmol/L (ref 101–111)
Creatinine, Ser: 0.87 mg/dL (ref 0.44–1.00)
GFR calc Af Amer: >60 mL/min (ref >60)
GFR calc non Af Amer: >60 mL/min (ref >60)
Glucose, Bld: 98 mg/dL (ref 65–99)
Potassium: 2.9 mmol/L — ABNORMAL LOW (ref 3.5–5.1)
Sodium: 134 mmol/L — ABNORMAL LOW (ref 135–145)
Total Bilirubin: 0.6 mg/dL (ref 0.3–1.2)
Total Protein: 7.5 g/dL (ref 6.5–8.1)

## 2016-05-03 LAB — I-STAT BETA HCG BLOOD, ED (MC, WL, AP ONLY): I-stat hCG, quantitative: <5 m[IU]/mL (ref <5)

## 2016-05-03 LAB — DIFFERENTIAL
Basophils Absolute: 0 10*3/uL (ref 0.0–0.1)
Basophils Relative: 0 %
Eosinophils Absolute: 0.1 10*3/uL (ref 0.0–0.7)
Eosinophils Relative: 1 %
Lymphocytes Relative: 36 %
Lymphs Abs: 2.8 10*3/uL (ref 0.7–4.0)
Monocytes Absolute: 0.5 10*3/uL (ref 0.1–1.0)
Monocytes Relative: 7 %
Neutro Abs: 4.3 10*3/uL (ref 1.7–7.7)
Neutrophils Relative %: 56 %

## 2016-05-03 LAB — MAGNESIUM: Magnesium: 2 mg/dL (ref 1.7–2.4)

## 2016-05-03 LAB — BRAIN NATRIURETIC PEPTIDE: B Natriuretic Peptide: 28.6 pg/mL (ref 0.0–100.0)

## 2016-05-03 LAB — I-STAT TROPONIN, ED: Troponin i, poc: 0 ng/mL (ref 0.00–0.08)

## 2016-05-03 LAB — TSH: TSH: 3.047 u[IU]/mL (ref 0.350–4.500)

## 2016-05-03 MED ORDER — POTASSIUM CHLORIDE CRYS ER 20 MEQ PO TBCR
80.0000 meq | EXTENDED_RELEASE_TABLET | Freq: Once | ORAL | Status: AC
  Administered 2016-05-03: 80 meq via ORAL
  Filled 2016-05-03: qty 4

## 2016-05-03 NOTE — ED Provider Notes (Signed)
CSN: [id number]     Arrival date & time 05/03/16  2034 History   First MD Initiated Contact with Patient 05/03/16 2111     Chief Complaint  Patient presents with  . Chest Pain     (Consider location/radiation/quality/duration/timing/severity/associated sxs/prior Treatment) HPI 40 year old female who presents with chest pain starting today. Occurred at around 6-7 PM, while on the computer at her desk. Initially felt chest pressure followed by difficulty breathing. Lasted for 10 minutes and felt aching in the left arm and tingling. Afterwards felt very tired and fatigued. Now having aching pain on the left side of the chest and shoulder. Similar symptoms in the past and diagnosed with pleurisy. Has had swelling in LE worse over past month. Has been on diuretics since last year for this, but seems to be worsening. No aggravating  Or alleviating factors of pain. Not associated with position changes. No strenuous activty or heavy lifting. No exertional chest pain. No orthopnea or PND. Has felt very tired recently. States that she feels cold often, has been gaining weight.    Past Medical History  Diagnosis Date  . Asthma   . Abortion history   . Migraine headache   . Anemia   . Hypertension   . Fibroid tumor   . Shingles    Past Surgical History  Procedure Laterality Date  . Cesarean section    . Cystectomy      breast   Family History  Problem Relation Age of Onset  . Hypertension Mother   . Non-Hodgkin's lymphoma Mother     Deceased, 40  . Cancer Maternal Grandfather   . Cancer Paternal Grandfather   . Hypertension Father     Living, 2  . High Cholesterol Father   . Heart disease Maternal Uncle   . Heart disease Paternal Uncle   . Healthy Brother   . Asthma Son   . Asthma Daughter    Social History  Substance Use Topics  . Smoking status: Never Smoker   . Smokeless tobacco: Never Used  . Alcohol Use: No   OB History    No data available     Review of  Systems 10/14 systems reviewed and are negative other than those stated in the HPI    Allergies  Penicillins; Shellfish allergy; Lisinopril; Advair diskus; Erythromycin; Nyamyc; and Asa   Home Medications   Prior to Admission medications   Medication Sig Start Date End Date Taking? Authorizing Provider  albuterol (PROVENTIL HFA;VENTOLIN HFA) 108 (90 BASE) MCG/ACT inhaler Inhale 2 puffs into the lungs 2 (two) times daily. For shortness of breath   Yes Historical Provider, MD  hydrochlorothiazide (HYDRODIURIL) 25 MG tablet Take 25 mg by mouth daily. 02/25/15  Yes Historical Provider, MD  triamcinolone cream (KENALOG) 0.1 % Apply 1 application topically 2 (two) times daily. 02/17/16  Yes Janne Napoleon, NP  doxycycline (VIBRAMYCIN) 100 MG capsule Take 1 capsule (100 mg total) by mouth 2 (two) times daily. Patient not taking: Reported on 05/03/2016 09/19/15   Jola Schmidt, MD  predniSONE (DELTASONE) 20 MG tablet Take 3 tabs po on first day, 2 tabs second day, 2 tabs third day, 1 tab fourth day, 1 tab 5th day. Take with food. Patient not taking: Reported on 05/03/2016 02/17/16   Janne Napoleon, NP   BP 132/86 mmHg  Pulse 75  Temp(Src) 99.4 F (37.4 C) (Oral)  Resp 16  Ht 5\' 1"  (1.549 m)  Wt 206 lb 9.6 oz (93.713 kg)  BMI  39.06 kg/m2  SpO2 100%  LMP 04/23/2016 (Approximate) Physical Exam Physical Exam  Nursing note and vitals reviewed. Constitutional: Well developed, well nourished, non-toxic, and in no acute distress Head: Normocephalic and atraumatic.  Mouth/Throat: Oropharynx is clear and moist.  Neck: Normal range of motion. Neck supple.  Cardiovascular: Normal rate and regular rhythm.  Trace bilateral pedal edema. Pulmonary/Chest: Effort normal and breath sounds normal.  Abdominal: Soft. There is no tenderness. There is no rebound and no guarding.  Musculoskeletal: Normal range of motion.  Neurological: Alert, no facial droop, fluent speech, moves all extremities symmetrically Skin: Skin is  warm and dry.  Psychiatric: Cooperative  ED Course  Procedures (including critical care time) Labs Review Labs Reviewed  COMPREHENSIVE METABOLIC PANEL - Abnormal; Notable for the following:    Sodium 134 (*)    Potassium 2.9 (*)    All other components within normal limits  CBC  DIFFERENTIAL  TSH  BRAIN NATRIURETIC PEPTIDE  MAGNESIUM  I-STAT TROPOININ, ED  I-STAT BETA HCG BLOOD, ED ([facility], [facility], [facility] ONLY)  I-STAT TROPOININ, ED    Imaging Review Dg Chest 2 View  05/03/2016  CLINICAL DATA:  Chest pain EXAM: CHEST  2 VIEW COMPARISON:  09/19/2015 FINDINGS: Normal heart size and mediastinal contours. No acute infiltrate or edema. No effusion or pneumothorax. Nodular density at the right base is stable over multiple years and benign. No acute osseous findings. IMPRESSION: No active cardiopulmonary disease. Electronically Signed   By: Monte Fantasia M.D.   On: 05/03/2016 21:11   I have personally reviewed and evaluated these images and lab results as part of my medical decision-making.   EKG Interpretation   Date/Time:  Wednesday May 04 2016 00:43:32 EDT Ventricular Rate:  70 PR Interval:    QRS Duration: 83 QT Interval:  393 QTC Calculation: 424 R Axis:   56 Text Interpretation:  Sinus rhythm No significant change since last  tracing Confirmed by LIU MD, DANA [id number]) on 05/04/2016 12:45:39 AM      MDM   Final diagnoses:  Nonspecific chest pain    40 year old female with chest pain this evening and one month history of fatigue and progressive edema. With resolving symptoms on arrival to ED. Vital signs within normal limits. She is non-toxic and in no acute distress. Some pedal edema noted but no significant fluid overload. CXR normal. EKG is not ischemic and without heart strain. PERC negative and no PE risk factors. Presentation is also no typical for PE. No concerns for dissection or other serious intrathoracic or cardiopulmonary causes. She is low risk for ACS, heart score of  2. First troponin is normal. Will perform serial troponin and EKG. Unclear of what is causing progressive edema. No thyroid dysfunction. Normal renal function and liver function. BNP normal, and no major symptoms of CHF at this time. Ambulates in hallway to bathroom without dyspnea or worsening pain.   Forde Dandy, MD 05/04/16 [phone]

## 2016-05-03 NOTE — ED Notes (Signed)
MD at bedside. 

## 2016-05-03 NOTE — ED Notes (Signed)
Pt transported to XR.  

## 2016-05-03 NOTE — ED Notes (Signed)
Patient states she has been swelling in legs and hands for a month. Today chest pain started on the left chest and arm/fingers was in pain. Patient is stating she is tired. Patient says she has been stressed.

## 2016-05-04 LAB — I-STAT TROPONIN, ED: Troponin i, poc: 0 ng/mL (ref 0.00–0.08)

## 2016-05-04 NOTE — Discharge Instructions (Signed)
Please return without fail for worsening symptoms, including exertional chest pain, worsening breathing or pain, passing out, or any other symptoms concerning to you. You have been given follow-up, above, for cardiology clinic for potential ultrasound of your heart. Please also call your primary care physician to set up follow-up and continue ongoing work-up.  Nonspecific Chest Pain  Chest pain can be caused by many different conditions. There is always a chance that your pain could be related to something serious, such as a heart attack or a blood clot in your lungs. Chest pain can also be caused by conditions that are not life-threatening. If you have chest pain, it is very important to follow up with your health care provider. CAUSES  Chest pain can be caused by:  Heartburn.  Pneumonia or bronchitis.  Anxiety or stress.  Inflammation around your heart (pericarditis) or lung (pleuritis or pleurisy).  A blood clot in your lung.  A collapsed lung (pneumothorax). It can develop suddenly on its own (spontaneous pneumothorax) or from trauma to the chest.  Shingles infection (varicella-zoster virus).  Heart attack.  Damage to the bones, muscles, and cartilage that make up your chest wall. This can include:  Bruised bones due to injury.  Strained muscles or cartilage due to frequent or repeated coughing or overwork.  Fracture to one or more ribs.  Sore cartilage due to inflammation (costochondritis). RISK FACTORS  Risk factors for chest pain may include:  Activities that increase your risk for trauma or injury to your chest.  Respiratory infections or conditions that cause frequent coughing.  Medical conditions or overeating that can cause heartburn.  Heart disease or family history of heart disease.  Conditions or health behaviors that increase your risk of developing a blood clot.  Having had chicken pox (varicella zoster). SIGNS AND SYMPTOMS Chest pain can feel  like:  Burning or tingling on the surface of your chest or deep in your chest.  Crushing, pressure, aching, or squeezing pain.  Dull or sharp pain that is worse when you move, cough, or take a deep breath.  Pain that is also felt in your back, neck, shoulder, or arm, or pain that spreads to any of these areas. Your chest pain may come and go, or it may stay constant. DIAGNOSIS Lab tests or other studies may be needed to find the cause of your pain. Your health care provider may have you take a test called an ambulatory ECG (electrocardiogram). An ECG records your heartbeat patterns at the time the test is performed. You may also have other tests, such as:  Transthoracic echocardiogram (TTE). During echocardiography, sound waves are used to create a picture of all of the heart structures and to look at how blood flows through your heart.  Transesophageal echocardiogram (TEE).This is a more advanced imaging test that obtains images from inside your body. It allows your health care provider to see your heart in finer detail.  Cardiac monitoring. This allows your health care provider to monitor your heart rate and rhythm in real time.  Holter monitor. This is a portable device that records your heartbeat and can help to diagnose abnormal heartbeats. It allows your health care provider to track your heart activity for several days, if needed.  Stress tests. These can be done through exercise or by taking medicine that makes your heart beat more quickly.  Blood tests.  Imaging tests. TREATMENT  Your treatment depends on what is causing your chest pain. Treatment may include:  Medicines. These  may include:  Acid blockers for heartburn.  Anti-inflammatory medicine.  Pain medicine for inflammatory conditions.  Antibiotic medicine, if an infection is present.  Medicines to dissolve blood clots.  Medicines to treat coronary artery disease.  Supportive care for conditions that do not  require medicines. This may include:  Resting.  Applying heat or cold packs to injured areas.  Limiting activities until pain decreases. HOME CARE INSTRUCTIONS  If you were prescribed an antibiotic medicine, finish it all even if you start to feel better.  Avoid any activities that bring on chest pain.  Do not use any tobacco products, including cigarettes, chewing tobacco, or electronic cigarettes. If you need help quitting, ask your health care provider.  Do not drink alcohol.  Take medicines only as directed by your health care provider.  Keep all follow-up visits as directed by your health care provider. This is important. This includes any further testing if your chest pain does not go away.  If heartburn is the cause for your chest pain, you may be told to keep your head raised (elevated) while sleeping. This reduces the chance that acid will go from your stomach into your esophagus.  Make lifestyle changes as directed by your health care provider. These may include:  Getting regular exercise. Ask your health care provider to suggest some activities that are safe for you.  Eating a heart-healthy diet. A registered dietitian can help you to learn healthy eating options.  Maintaining a healthy weight.  Managing diabetes, if necessary.  Reducing stress. SEEK MEDICAL CARE IF:  Your chest pain does not go away after treatment.  You have a rash with blisters on your chest.  You have a fever. SEEK IMMEDIATE MEDICAL CARE IF:   Your chest pain is worse.  You have an increasing cough, or you cough up blood.  You have severe abdominal pain.  You have severe weakness.  You faint.  You have chills.  You have sudden, unexplained chest discomfort.  You have sudden, unexplained discomfort in your arms, back, neck, or jaw.  You have shortness of breath at any time.  You suddenly start to sweat, or your skin gets clammy.  You feel nauseous or you vomit.  You  suddenly feel light-headed or dizzy.  Your heart begins to beat quickly, or it feels like it is skipping beats. These symptoms may represent a serious problem that is an emergency. Do not wait to see if the symptoms will go away. Get medical help right away. Call your local emergency services (911 in the [place] Do not drive yourself to the hospital.   This information is not intended to replace advice given to you by your health care provider. Make sure you discuss any questions you have with your health care provider.   Document Released: 07/13/2005 Document Revised: 10/24/2014 Document Reviewed: 05/09/2014 Elsevier Interactive Patient Education [employer].

## 2016-06-15 DIAGNOSIS — R079 Chest pain, unspecified: Secondary | ICD-10-CM | POA: Insufficient documentation

## 2016-06-15 HISTORY — DX: Chest pain, unspecified: R07.9

## 2016-06-16 ENCOUNTER — Encounter (INDEPENDENT_AMBULATORY_CARE_PROVIDER_SITE_OTHER): Payer: Self-pay

## 2016-06-16 ENCOUNTER — Encounter: Payer: Self-pay | Admitting: Interventional Cardiology

## 2016-06-16 ENCOUNTER — Ambulatory Visit (INDEPENDENT_AMBULATORY_CARE_PROVIDER_SITE_OTHER): Payer: Managed Care, Other (non HMO) | Admitting: Interventional Cardiology

## 2016-06-16 VITALS — BP 136/86 | HR 84 | Ht 60.0 in | Wt 209.0 lb

## 2016-06-16 DIAGNOSIS — R0683 Snoring: Secondary | ICD-10-CM

## 2016-06-16 DIAGNOSIS — R091 Pleurisy: Secondary | ICD-10-CM

## 2016-06-16 DIAGNOSIS — R0600 Dyspnea, unspecified: Secondary | ICD-10-CM

## 2016-06-16 DIAGNOSIS — R5382 Chronic fatigue, unspecified: Secondary | ICD-10-CM | POA: Diagnosis not present

## 2016-06-16 HISTORY — DX: Pleurisy: R09.1

## 2016-06-16 LAB — RHEUMATOID FACTOR: Rhuematoid fact SerPl-aCnc: <10 IU/mL (ref <=14)

## 2016-06-16 NOTE — Patient Instructions (Signed)
Medication Instructions:  Your physician recommends that you continue on your current medications as directed. Please refer to the Current Medication list given to you today.   Labwork: Bnp, Sed rate, ANA, Anti DNA, Complement  Testing/Procedures: Your physician has requested that you have an echocardiogram. Echocardiography is a painless test that uses sound waves to create images of your heart. It provides your doctor with information about the size and shape of your heart and how well your heart's chambers and valves are working. This procedure takes approximately one hour. There are no restrictions for this procedure.   Your physician has recommended that you have a sleep study. This test records several body functions during sleep, including: brain activity, eye movement, oxygen and carbon dioxide blood levels, heart rate and rhythm, breathing rate and rhythm, the flow of air through your mouth and nose, snoring, body muscle movements, and chest and belly movement. Idalia Needle will call you to schedule)   Follow-Up: Your physician recommends that you schedule a follow-up appointment pending test results   Any Other Special Instructions Will Be Listed Below (If Applicable).     If you need a refill on your cardiac medications before your next appointment, please call your pharmacy.

## 2016-06-16 NOTE — Progress Notes (Signed)
Cardiology Office Note    Date:  06/16/2016   ID:  Audrey [name], DOB March 25, 1976, MRN [id number]  PCP:  Antony Blackbird, MD  Cardiologist: Sinclair Grooms, MD   Chief Complaint  Patient presents with  . Chest Pain    History of Present Illness:  Audrey [name] is a 40 y.o. female who presents for evaluation of recurring chest pain.  She has multiple complaints that include 2 episodes of pleurisy in the past, diffuse muscle aches and pains. She has a history of muscle stiffness. She has been suffering with lower extremity and really generalized body swelling. She has developed multiple allergies to medications. She has had difficulty with skin rashes. No overarching diagnosis is been made. She feels sleepy and tired all the time. She snores loudly and has disturbed sleep observed by her family and also her roommate in college. He disease of her to fall asleep during the daytime.  She has a history of asthma that is been made during her adult life although she remembers being short of breath as a teenager dissipating and scholastic sports. She denies orthopnea and PND. There is no exercise-induced component to her chest discomfort. Because of muscle and leg weakness she was evaluated by neurology for the possibility of multiple sclerosis but no diagnosis was made.    Past Medical History:  Diagnosis Date  . Abortion history   . Anemia   . Asthma   . Fibroid tumor   . Hypertension   . Migraine headache   . Shingles     Past Surgical History:  Procedure Laterality Date  . CESAREAN SECTION    . CYSTECTOMY     breast    Current Medications: Outpatient Medications Prior to Visit  Medication Sig Dispense Refill  . albuterol (PROVENTIL HFA;VENTOLIN HFA) 108 (90 BASE) MCG/ACT inhaler Inhale 2 puffs into the lungs 2 (two) times daily. For shortness of breath    . hydrochlorothiazide (HYDRODIURIL) 25 MG tablet Take 25 mg by mouth daily.  0  . triamcinolone cream (KENALOG) 0.1 %  Apply 1 application topically 2 (two) times daily. 30 g 0  . doxycycline (VIBRAMYCIN) 100 MG capsule Take 1 capsule (100 mg total) by mouth 2 (two) times daily. (Patient not taking: Reported on 05/03/2016) 14 capsule 0  . predniSONE (DELTASONE) 20 MG tablet Take 3 tabs po on first day, 2 tabs second day, 2 tabs third day, 1 tab fourth day, 1 tab 5th day. Take with food. (Patient not taking: Reported on 05/03/2016) 9 tablet 0   No facility-administered medications prior to visit.      Allergies:   Penicillins; Shellfish allergy; Lisinopril; Advair diskus [fluticasone-salmeterol]; Erythromycin; Nyamyc [nystatin]; and Asa  [aspirin]   Social History   Social History  . Marital status: Single    Spouse name: N/A  . Number of children: N/A  . Years of education: N/A   Social History Main Topics  . Smoking status: Never Smoker  . Smokeless tobacco: Never Used  . Alcohol use No  . Drug use: No  . Sexual activity: Not Asked   Other Topics Concern  . None   Social History Narrative   Lives with children and boyfriend in a one story home.     Works for [employer].     Education: BS     Family History:  The patient's family history includes Asthma in her daughter and son; Cancer in her maternal grandfather and paternal grandfather; Healthy in her  brother; Heart disease in her maternal uncle and paternal uncle; High Cholesterol in her father; Hypertension in her father and mother; Non-Hodgkin's lymphoma in her mother.   ROS:   Please see the history of present illness.    Complaints of unexplained weight gain, chills, leg swelling, vision disturbance, shortness of breath, back pain, muscle pain, joint swelling, balance difficulty, facial swelling, leg pain, excessive fatigue and excessive sweating.  All other systems reviewed and are negative.   PHYSICAL EXAM:   VS:  BP 136/86   Pulse 84   Ht 5' (1.524 m)   Wt 209 lb (94.8 kg)   BMI 40.82 kg/m    GEN: Well nourished, well developed, in  no acute distress. There is moderate obesity. Evidence of darkened skin at sites of prior irritation/rash. This appearance is all over her body.  HEENT: normal  Neck: no JVD, carotid bruits, or masses Cardiac: RRR; no murmurs, rubs, or gallops,no edema  Respiratory:  clear to auscultation bilaterally, normal work of breathing GI: soft, nontender, nondistended, + BS MS: no deformity or atrophy  Skin: warm and dry, no rash Neuro:  Alert and Oriented x 3, Strength and sensation are intact Psych: euthymic mood, full affect  Wt Readings from Last 3 Encounters:  06/16/16 209 lb (94.8 kg)  05/03/16 206 lb 9.6 oz (93.7 kg)  09/19/15 201 lb (91.2 kg)      Studies/Labs Reviewed:   EKG:  EKG  Normal sinus rhythm with nonspecific T-wave flattening. No significant change when compared to July 2017.  Recent Labs: 05/03/2016: ALT 22; B Natriuretic Peptide 28.6; BUN 18; Creatinine, Ser 0.87; Hemoglobin 12.4; Magnesium 2.0; Platelets 274; Potassium 2.9; Sodium 134; TSH 3.047   Lipid Panel No results found for: CHOL, TRIG, HDL, CHOLHDL, VLDL, LDLCALC, LDLDIRECT  Additional studies/ records that were reviewed today include:  rEVIEWED er DATA    ASSESSMENT:    1. Dyspnea   2. Pleurisy   3. Snoring   4. Chronic fatigue      PLAN:  In order of problems listed above:  1. Dyspnea is of uncertain etiology. 2-D Doppler echocardiogram will be done to exclude the possibility of pulmonary hypertension from sleep apnea. Will also help [place] to exclude the possibility of pericardial disease or unsuspected left heart failure. A BNP will also be performed. 2. Pleurisy has occurred on 2 occasions, and in conjunction with her other musculoskeletal complaints including joint aches, skin rash, and allergy, the question of connective tissue disease is raised. An erythrocyte sedimentation rate, ANA, anti-DNA, complement level, and rheumatoid factor will be obtained. 3. Given her complaint of long-standing  snoring and multiple complaints from family as well as complaints as far back as her college days, sleep apnea needs to be excluded. This could account for her fatigue and also the musculoskeletal syndrome that she complains of.    Medication Adjustments/Labs and Tests Ordered: Current medicines are reviewed at length with the patient today.  Concerns regarding medicines are outlined above.  Medication changes, Labs and Tests ordered today are listed in the Patient Instructions below. Patient Instructions  Medication Instructions:  Your physician recommends that you continue on your current medications as directed. Please refer to the Current Medication list given to you today.   Labwork: Bnp, Sed rate, ANA, Anti DNA, Complement  Testing/Procedures: Your physician has requested that you have an echocardiogram. Echocardiography is a painless test that uses sound waves to create images of your heart. It provides your doctor with information about  the size and shape of your heart and how well your heart's chambers and valves are working. This procedure takes approximately one hour. There are no restrictions for this procedure.   Your physician has recommended that you have a sleep study. This test records several body functions during sleep, including: brain activity, eye movement, oxygen and carbon dioxide blood levels, heart rate and rhythm, breathing rate and rhythm, the flow of air through your mouth and nose, snoring, body muscle movements, and chest and belly movement. Idalia Needle will call you to schedule)   Follow-Up: Your physician recommends that you schedule a follow-up appointment pending test results   Any Other Special Instructions Will Be Listed Below (If Applicable).     If you need a refill on your cardiac medications before your next appointment, please call your pharmacy.      Signed, Sinclair Grooms, MD  06/16/2016 5:52 PM    [facility], [place], [place]  [address] Phone: [phone]; Fax: [phone]

## 2016-06-17 LAB — ANTI-DNA ANTIBODY, DOUBLE-STRANDED: ds DNA Ab: <1 IU/mL

## 2016-06-17 LAB — SEDIMENTATION RATE: Sed Rate: 28 mm/hr — ABNORMAL HIGH (ref 0–20)

## 2016-06-17 LAB — ANA: Anti Nuclear Antibody(ANA): NEGATIVE

## 2016-06-17 LAB — BRAIN NATRIURETIC PEPTIDE: Brain Natriuretic Peptide: 12.1 pg/mL (ref <100)

## 2016-06-21 ENCOUNTER — Telehealth: Payer: Self-pay | Admitting: Interventional Cardiology

## 2016-06-21 NOTE — Telephone Encounter (Signed)
New message       Calling to get lab results.  OK to leave msg on private vm.

## 2016-06-21 NOTE — Telephone Encounter (Signed)
Reviewed results of lab with patient who states understanding.

## 2016-06-22 LAB — COMPLEMENT, TOTAL

## 2016-07-07 ENCOUNTER — Ambulatory Visit (HOSPITAL_COMMUNITY): Payer: Managed Care, Other (non HMO) | Attending: Internal Medicine

## 2016-07-07 ENCOUNTER — Other Ambulatory Visit: Payer: Self-pay

## 2016-07-07 DIAGNOSIS — Z8249 Family history of ischemic heart disease and other diseases of the circulatory system: Secondary | ICD-10-CM | POA: Diagnosis not present

## 2016-07-07 DIAGNOSIS — Z6841 Body Mass Index (BMI) 40.0 and over, adult: Secondary | ICD-10-CM | POA: Diagnosis not present

## 2016-07-07 DIAGNOSIS — R0600 Dyspnea, unspecified: Secondary | ICD-10-CM | POA: Insufficient documentation

## 2016-07-07 DIAGNOSIS — J45909 Unspecified asthma, uncomplicated: Secondary | ICD-10-CM | POA: Diagnosis not present

## 2016-07-07 DIAGNOSIS — I10 Essential (primary) hypertension: Secondary | ICD-10-CM | POA: Insufficient documentation

## 2016-07-07 DIAGNOSIS — E669 Obesity, unspecified: Secondary | ICD-10-CM | POA: Diagnosis not present

## 2016-07-12 ENCOUNTER — Telehealth: Payer: Self-pay | Admitting: Interventional Cardiology

## 2016-07-12 NOTE — Telephone Encounter (Signed)
Pt aware of echo results with verbal understanding. 

## 2016-07-12 NOTE — Telephone Encounter (Signed)
-----   Message from Belva Crome, MD sent at 07/08/2016  1:09 PM EDT ----- Let the patient know the heart evaluation is normal. No further cardiac studies are needed. The connective tissue bloodwork was also negative. No evidence of fluid in the lungs. Only outstanding issue is whether sleep apnea is present. I can't remember if we ordered this test or if this would be considered by the primary care physician, Dr. Chapman Fitch A copy will be sent to FULP, CAMMIE, MD

## 2016-07-12 NOTE — Telephone Encounter (Signed)
Ms. Ellingson is returning a call about test results. Please call if no answer , please call her on [phone]

## 2016-08-04 ENCOUNTER — Encounter (HOSPITAL_BASED_OUTPATIENT_CLINIC_OR_DEPARTMENT_OTHER): Payer: Managed Care, Other (non HMO)

## 2016-09-18 ENCOUNTER — Encounter (HOSPITAL_COMMUNITY): Payer: Self-pay | Admitting: *Deleted

## 2016-09-18 ENCOUNTER — Emergency Department (HOSPITAL_COMMUNITY)
Admission: EM | Admit: 2016-09-18 | Discharge: 2016-09-19 | Disposition: A | Discharge status: Home / Self Care | Payer: Managed Care, Other (non HMO) | Attending: Emergency Medicine | Admitting: Emergency Medicine

## 2016-09-18 ENCOUNTER — Emergency Department (HOSPITAL_COMMUNITY): Payer: Managed Care, Other (non HMO)

## 2016-09-18 DIAGNOSIS — I10 Essential (primary) hypertension: Secondary | ICD-10-CM | POA: Insufficient documentation

## 2016-09-18 DIAGNOSIS — M542 Cervicalgia: Secondary | ICD-10-CM | POA: Diagnosis not present

## 2016-09-18 DIAGNOSIS — R51 Headache: Secondary | ICD-10-CM | POA: Insufficient documentation

## 2016-09-18 DIAGNOSIS — H538 Other visual disturbances: Secondary | ICD-10-CM | POA: Insufficient documentation

## 2016-09-18 LAB — CBC
HCT: 37 % (ref 36.0–46.0)
Hemoglobin: 12.3 g/dL (ref 12.0–15.0)
MCH: 28.5 pg (ref 26.0–34.0)
MCHC: 33.2 g/dL (ref 30.0–36.0)
MCV: 85.6 fL (ref 78.0–100.0)
Platelets: 283 10*3/uL (ref 150–400)
RBC: 4.32 MIL/uL (ref 3.87–5.11)
RDW: 14 % (ref 11.5–15.5)
WBC: 9.5 10*3/uL (ref 4.0–10.5)

## 2016-09-18 LAB — COMPREHENSIVE METABOLIC PANEL
ALT: 16 U/L (ref 14–54)
AST: 19 U/L (ref 15–41)
Albumin: 3.1 g/dL — ABNORMAL LOW (ref 3.5–5.0)
Alkaline Phosphatase: 60 U/L (ref 38–126)
Anion gap: 10 (ref 5–15)
BUN: 12 mg/dL (ref 6–20)
CO2: 22 mmol/L (ref 22–32)
Calcium: 9.2 mg/dL (ref 8.9–10.3)
Chloride: 106 mmol/L (ref 101–111)
Creatinine, Ser: 0.79 mg/dL (ref 0.44–1.00)
GFR calc Af Amer: >60 mL/min (ref >60)
GFR calc non Af Amer: >60 mL/min (ref >60)
Glucose, Bld: 106 mg/dL — ABNORMAL HIGH (ref 65–99)
Potassium: 3.4 mmol/L — ABNORMAL LOW (ref 3.5–5.1)
Sodium: 138 mmol/L (ref 135–145)
Total Bilirubin: 0.2 mg/dL — ABNORMAL LOW (ref 0.3–1.2)
Total Protein: 6.8 g/dL (ref 6.5–8.1)

## 2016-09-18 MED ORDER — LIDOCAINE HCL (PF) 1 % IJ SOLN
5.0000 mL | Freq: Once | INTRAMUSCULAR | Status: AC
  Administered 2016-09-19: 5 mL via INTRADERMAL
  Filled 2016-09-18: qty 5

## 2016-09-18 NOTE — ED Triage Notes (Addendum)
Pt has been have central, left shoulder pain x 3 weeks. Denies increase in pain with inspiration or movement. Pt has had neck pain and headache with blurriness to L eye x 1 week. Pt has seen Dr.Smith, cardiology, within this time period and seen PCP for a work up. Pt also reports unexplained bruises

## 2016-09-18 NOTE — ED Provider Notes (Signed)
[facility] DEPT Provider Note   CSN: [id number] Arrival date & time: 09/18/16  1956     History   Chief Complaint Chief Complaint  Patient presents with  . Headache  . Neck Pain     HPI   Blood pressure 145/85, pulse 82, temperature 97.8 F (36.6 C), temperature source Oral, resp. rate 18, height 5\' 1"  (1.549 m), weight 95.7 kg, last menstrual period 08/27/2016, SpO2 99 %.  Audrey [name] is a 40 y.o. female complaining of Left scapular pain with associated blurred vision in the left eye onset 2 weeks ago, she states that she pain is radiating up the left neck and is associated with left lateral neck swelling. She has had multiple issues in the past and has been workup for MS, lupus, evaluation by cardiology all of which has been negative. Patient voices her frustration, she's taken over-the-counter pain medication with little relief. She presents to the emergency department today because she feels that the pain has significantly worsened this afternoon and states that she doubled over in pain. She denies any numbness, weakness, dysarthria, ataxia, shortness of breath, cough, abdominal pain, change in bowel or bladder habits. She does have associated nasal congestion. States she is due for a I examined January.    Past Medical History:  Diagnosis Date  . Abortion history   . Anemia   . Asthma   . Fibroid tumor   . Hypertension   . Migraine headache   . Shingles     Patient Active Problem List   Diagnosis Date Noted  . Pleurisy 06/16/2016  . Chest pain 06/15/2016  . Paresthesia 03/18/2015  . Acute left-sided weakness 05/13/2013  . UNSPECIFIED VITAMIN D DEFICIENCY 06/06/2008  . VIRAL INFECTION 08/31/2007  . ALLERGIC RHINITIS 08/31/2007  . Asthma 08/31/2007    Past Surgical History:  Procedure Laterality Date  . CESAREAN SECTION    . CYSTECTOMY     breast    OB History    No data available       Home Medications    Prior to Admission medications     Medication Sig Start Date End Date Taking? Authorizing Provider  albuterol (PROVENTIL HFA;VENTOLIN HFA) 108 (90 BASE) MCG/ACT inhaler Inhale 2 puffs into the lungs 2 (two) times daily. For shortness of breath   Yes Historical Provider, MD  ibuprofen (ADVIL,MOTRIN) 200 MG tablet Take 400 mg by mouth every 6 (six) hours as needed.   Yes Historical Provider, MD  triamcinolone cream (KENALOG) 0.1 % Apply 1 application topically 2 (two) times daily. Patient taking differently: Apply 1 application topically daily as needed (irritation).  02/17/16  Yes Janne Napoleon, NP  diclofenac sodium (VOLTAREN) 1 % GEL Apply 2 g topically 4 (four) times daily. 09/19/16   Zakiyyah Savannah, PA-C  hydrochlorothiazide (HYDRODIURIL) 25 MG tablet Take 25 mg by mouth daily. 02/25/15   Historical Provider, MD    Family History Family History  Problem Relation Age of Onset  . Hypertension Father     Living, 33  . High Cholesterol Father   . Hypertension Mother   . Non-Hodgkin's lymphoma Mother     Deceased, 75  . Cancer Maternal Grandfather   . Cancer Paternal Grandfather   . Heart disease Maternal Uncle   . Heart disease Paternal Uncle   . Healthy Brother   . Asthma Son   . Asthma Daughter     Social History Social History  Substance Use Topics  . Smoking status: Never Smoker  .  Smokeless tobacco: Never Used  . Alcohol use No     Allergies   Macrolides and ketolides; Penicillins; Shellfish allergy; Advair diskus [fluticasone-salmeterol]; Erythromycin; Lisinopril; Nyamyc [nystatin]; and Asa [aspirin]   Review of Systems Review of Systems  10 systems reviewed and found to be negative, except as noted in the HPI.   Physical Exam Updated Vital Signs BP 133/84 (BP Location: Right Arm)   Pulse 70   Temp 98.1 F (36.7 C) (Oral)   Resp 18   Ht 5\' 1"  (1.549 m)   Wt 95.7 kg   LMP 08/27/2016   SpO2 96%   BMI 39.87 kg/m   Physical Exam  Constitutional: She is oriented to person, place, and time. She  appears well-developed and well-nourished. No distress.  HENT:  Head: Normocephalic and atraumatic.  Mouth/Throat: Oropharynx is clear and moist.  Eyes: Conjunctivae and EOM are normal. Pupils are equal, round, and reactive to light.  No TTP of maxillary or frontal sinuses  No TTP or induration of temporal arteries bilaterally  Neck: Normal range of motion. Neck supple.  FROM to C-spine. Pt can touch chin to chest without discomfort. No TTP of midline cervical spine.   Cardiovascular: Normal rate, regular rhythm and intact distal pulses.   Pulmonary/Chest: Effort normal and breath sounds normal. No respiratory distress. She has no wheezes. She has no rales. She exhibits no tenderness.  Abdominal: Soft. Bowel sounds are normal. There is no tenderness.  Musculoskeletal: Normal range of motion. She exhibits no edema or tenderness.       Back:  Neurological: She is alert and oriented to person, place, and time. No cranial nerve deficit.  II-Visual fields grossly intact. III/IV/VI-Extraocular movements intact.  Pupils reactive bilaterally. V/VII-Smile symmetric, equal eyebrow raise,  facial sensation intact VIII- Hearing grossly intact IX/X-Normal gag XI-bilateral shoulder shrug XII-midline tongue extension Motor: 5/5 bilaterally with normal tone and bulk Cerebellar: Normal finger-to-nose  and normal heel-to-shin test.   Romberg negative Ambulates with a coordinated gait   Skin: She is not diaphoretic.  Psychiatric: She has a normal mood and affect.  Nursing note and vitals reviewed.    ED Treatments / Results  Labs (all labs ordered are listed, but only abnormal results are displayed) Labs Reviewed  COMPREHENSIVE METABOLIC PANEL - Abnormal; Notable for the following:       Result Value   Potassium 3.4 (*)    Glucose, Bld 106 (*)    Albumin 3.1 (*)    Total Bilirubin 0.2 (*)    All other components within normal limits  CBC    EKG  EKG Interpretation None        Radiology Ct Head Wo Contrast  Result Date: 09/18/2016 CLINICAL DATA:  Acute onset of dizziness and vision changes. Left scapular pain, radiating to the neck, subacute onset. Initial encounter. EXAM: CT HEAD WITHOUT CONTRAST CT CERVICAL SPINE WITHOUT CONTRAST TECHNIQUE: Multidetector CT imaging of the head and cervical spine was performed following the standard protocol without intravenous contrast. Multiplanar CT image reconstructions of the cervical spine were also generated. COMPARISON:  CT of the head performed 05/13/2013, and MRI of the brain performed 05/14/2013 FINDINGS: CT HEAD FINDINGS Brain: No evidence of acute infarction, hemorrhage, hydrocephalus, extra-axial collection or mass lesion/mass effect. The posterior fossa, including the cerebellum, brainstem and fourth ventricle, is within normal limits. The third and lateral ventricles, and basal ganglia are unremarkable in appearance. The cerebral hemispheres are symmetric in appearance, with normal gray-white differentiation. No mass effect or midline  shift is seen. Vascular: No hyperdense vessel or unexpected calcification. Skull: There is no evidence of fracture; visualized osseous structures are unremarkable in appearance. Sinuses/Orbits: The visualized portions of the orbits are within normal limits. The paranasal sinuses and mastoid air cells are well-aerated. Other: No significant soft tissue abnormalities are seen. CT CERVICAL SPINE FINDINGS Alignment: Normal. Skull base and vertebrae: No acute fracture. No primary bone lesion or focal pathologic process. Soft tissues and spinal canal: No prevertebral fluid or swelling. No visible canal hematoma. Disc levels: The intervertebral disc spaces are preserved. The bony foramina are grossly unremarkable in appearance. Upper chest: The minimally visualized lung apices are clear. The thyroid gland is unremarkable in appearance. Other: No additional soft tissue abnormalities are seen. IMPRESSION: 1.  No evidence of traumatic intracranial injury or fracture. 2. No evidence of fracture or subluxation along the cervical spine. Electronically Signed   By: Garald Balding M.D.   On: 09/18/2016 22:32   Ct Cervical Spine Wo Contrast  Result Date: 09/18/2016 CLINICAL DATA:  Acute onset of dizziness and vision changes. Left scapular pain, radiating to the neck, subacute onset. Initial encounter. EXAM: CT HEAD WITHOUT CONTRAST CT CERVICAL SPINE WITHOUT CONTRAST TECHNIQUE: Multidetector CT imaging of the head and cervical spine was performed following the standard protocol without intravenous contrast. Multiplanar CT image reconstructions of the cervical spine were also generated. COMPARISON:  CT of the head performed 05/13/2013, and MRI of the brain performed 05/14/2013 FINDINGS: CT HEAD FINDINGS Brain: No evidence of acute infarction, hemorrhage, hydrocephalus, extra-axial collection or mass lesion/mass effect. The posterior fossa, including the cerebellum, brainstem and fourth ventricle, is within normal limits. The third and lateral ventricles, and basal ganglia are unremarkable in appearance. The cerebral hemispheres are symmetric in appearance, with normal gray-white differentiation. No mass effect or midline shift is seen. Vascular: No hyperdense vessel or unexpected calcification. Skull: There is no evidence of fracture; visualized osseous structures are unremarkable in appearance. Sinuses/Orbits: The visualized portions of the orbits are within normal limits. The paranasal sinuses and mastoid air cells are well-aerated. Other: No significant soft tissue abnormalities are seen. CT CERVICAL SPINE FINDINGS Alignment: Normal. Skull base and vertebrae: No acute fracture. No primary bone lesion or focal pathologic process. Soft tissues and spinal canal: No prevertebral fluid or swelling. No visible canal hematoma. Disc levels: The intervertebral disc spaces are preserved. The bony foramina are grossly unremarkable in  appearance. Upper chest: The minimally visualized lung apices are clear. The thyroid gland is unremarkable in appearance. Other: No additional soft tissue abnormalities are seen. IMPRESSION: 1. No evidence of traumatic intracranial injury or fracture. 2. No evidence of fracture or subluxation along the cervical spine. Electronically Signed   By: Garald Balding M.D.   On: 09/18/2016 22:32    Procedures Procedures (including critical care time)  Procedure Note: Trigger Point Injection for Myofascial pain  Performed by Zacharia Sowles, PA-C  Indication: muscle/myofascial pain Muscle body and tendon sheath of the left infraspinatous muscle(s) were injected with 0.5% bupivacaine under sterile technique for release of muscle spasm/pain. Patient tolerated well with immediate improvement of symptoms and no immediate complications following procedure.  CPT Code:   1 or 2 muscle bodies: 20552 3 or more: 20553    Medications Ordered in ED Medications  lidocaine (PF) (XYLOCAINE) 1 % injection 5 mL (5 mLs Intradermal Given by Other 09/19/16 0107)     Initial Impression / Assessment and Plan / ED Course  I have reviewed the triage vital signs and  the nursing notes.  Pertinent labs & imaging results that were available during my care of the patient were reviewed by me and considered in my medical decision making (see chart for details).  Clinical Course     Vitals:   09/19/16 0000 09/19/16 0015 09/19/16 0030 09/19/16 0105  BP: 134/83 130/73 (!) 137/102 133/84  Pulse: 77 80 87 70  Resp:    18  Temp:    98.1 F (36.7 C)  TempSrc:    Oral  SpO2: 98% 97% 98% 96%  Weight:      Height:        Medications  lidocaine (PF) (XYLOCAINE) 1 % injection 5 mL (5 mLs Intradermal Given by Other 09/19/16 0107)    Georgina Pillion is 40 y.o. female presenting with Multiple complaints including persistent left scapular pain which has been evaluated by cardiology with negative echo, left-sided blurred  vision and pain in the left neck. She's had an extensive workup of multiple complaints including a negative workup for MS and lupus. Patient is point tender along the left scapula on my exam. Blood work reassuring, neurologic exam nonfocal. CT head and neck negative. Trigger point injection performed with some relief, reassured patient asked her to follow closely with primary care. Voltaren gel given for comfort.  Evaluation does not show pathology that would require ongoing emergent intervention or inpatient treatment. Pt is hemodynamically stable and mentating appropriately. Discussed findings and plan with patient/guardian, who agrees with care plan. All questions answered. Return precautions discussed and outpatient follow up given.   Final Clinical Impressions(s) / ED Diagnoses   Final diagnoses:  Neck pain    New Prescriptions Discharge Medication List as of 09/19/2016  1:02 AM    START taking these medications   Details  diclofenac sodium (VOLTAREN) 1 % GEL Apply 2 g topically 4 (four) times daily., Starting Mon 09/19/2016, [employer], PA-C 09/19/16 [id number]    Daleen Bo, MD 09/21/16 1012

## 2016-09-19 MED ORDER — DICLOFENAC SODIUM 1 % TD GEL: 2.0000 g | Freq: Four times a day (QID) | TRANSDERMAL | 0 refills | Status: DC

## 2016-09-19 NOTE — Discharge Instructions (Signed)
Please follow with your primary care doctor in the next 2 days for a check-up. They must obtain records for further management.  ° °Do not hesitate to return to the Emergency Department for any new, worsening or concerning symptoms.  ° °

## 2016-09-23 ENCOUNTER — Ambulatory Visit (HOSPITAL_BASED_OUTPATIENT_CLINIC_OR_DEPARTMENT_OTHER): Payer: Managed Care, Other (non HMO)

## 2016-10-23 ENCOUNTER — Ambulatory Visit (HOSPITAL_BASED_OUTPATIENT_CLINIC_OR_DEPARTMENT_OTHER): Payer: Managed Care, Other (non HMO) | Attending: Cardiology

## 2017-01-24 ENCOUNTER — Encounter (HOSPITAL_COMMUNITY): Payer: Self-pay

## 2017-01-24 DIAGNOSIS — Z79899 Other long term (current) drug therapy: Secondary | ICD-10-CM | POA: Insufficient documentation

## 2017-01-24 DIAGNOSIS — I10 Essential (primary) hypertension: Secondary | ICD-10-CM | POA: Insufficient documentation

## 2017-01-24 DIAGNOSIS — R112 Nausea with vomiting, unspecified: Secondary | ICD-10-CM | POA: Diagnosis not present

## 2017-01-24 DIAGNOSIS — J45909 Unspecified asthma, uncomplicated: Secondary | ICD-10-CM | POA: Diagnosis not present

## 2017-01-24 DIAGNOSIS — R1030 Lower abdominal pain, unspecified: Secondary | ICD-10-CM | POA: Diagnosis present

## 2017-01-24 LAB — COMPREHENSIVE METABOLIC PANEL
ALT: 23 U/L (ref 14–54)
AST: 20 U/L (ref 15–41)
Albumin: 3.5 g/dL (ref 3.5–5.0)
Alkaline Phosphatase: 61 U/L (ref 38–126)
Anion gap: 8 (ref 5–15)
BUN: 15 mg/dL (ref 6–20)
CO2: 25 mmol/L (ref 22–32)
Calcium: 9.7 mg/dL (ref 8.9–10.3)
Chloride: 104 mmol/L (ref 101–111)
Creatinine, Ser: 0.96 mg/dL (ref 0.44–1.00)
GFR calc Af Amer: >60 mL/min (ref >60)
GFR calc non Af Amer: >60 mL/min (ref >60)
Glucose, Bld: 115 mg/dL — ABNORMAL HIGH (ref 65–99)
Potassium: 4 mmol/L (ref 3.5–5.1)
Sodium: 137 mmol/L (ref 135–145)
Total Bilirubin: 0.3 mg/dL (ref 0.3–1.2)
Total Protein: 7.1 g/dL (ref 6.5–8.1)

## 2017-01-24 LAB — CBC
HCT: 37.5 % (ref 36.0–46.0)
Hemoglobin: 12.4 g/dL (ref 12.0–15.0)
MCH: 28.5 pg (ref 26.0–34.0)
MCHC: 33.1 g/dL (ref 30.0–36.0)
MCV: 86.2 fL (ref 78.0–100.0)
Platelets: 269 10*3/uL (ref 150–400)
RBC: 4.35 MIL/uL (ref 3.87–5.11)
RDW: 13.6 % (ref 11.5–15.5)
WBC: 7.7 10*3/uL (ref 4.0–10.5)

## 2017-01-24 LAB — I-STAT BETA HCG BLOOD, ED (MC, WL, AP ONLY): I-stat hCG, quantitative: <5 m[IU]/mL (ref <5)

## 2017-01-24 LAB — LIPASE, BLOOD: Lipase: 18 U/L (ref 11–51)

## 2017-01-24 MED ORDER — ONDANSETRON 4 MG PO TBDP
ORAL_TABLET | ORAL | Status: AC
  Filled 2017-01-24: qty 1

## 2017-01-24 NOTE — ED Triage Notes (Addendum)
Pt presents to the ed with complaints of diarrhea that started this morning, with nausea.  Now she feels generally ill and weak all over. Complains of pain in her abdomen and a mild headache.  States that she ate fish a few days in a row and it could be related to that. Denies any other symptoms

## 2017-01-25 ENCOUNTER — Emergency Department (HOSPITAL_COMMUNITY)
Admission: EM | Admit: 2017-01-25 | Discharge: 2017-01-25 | Disposition: A | Discharge status: Home / Self Care | Payer: Managed Care, Other (non HMO) | Attending: Emergency Medicine | Admitting: Emergency Medicine

## 2017-01-25 DIAGNOSIS — R112 Nausea with vomiting, unspecified: Secondary | ICD-10-CM

## 2017-01-25 DIAGNOSIS — R1084 Generalized abdominal pain: Secondary | ICD-10-CM

## 2017-01-25 LAB — URINALYSIS, ROUTINE W REFLEX MICROSCOPIC
Bilirubin Urine: NEGATIVE
Glucose, UA: NEGATIVE mg/dL
Hgb urine dipstick: NEGATIVE
Ketones, ur: NEGATIVE mg/dL
Nitrite: NEGATIVE
Protein, ur: NEGATIVE mg/dL
Specific Gravity, Urine: 1.025 (ref 1.005–1.030)
pH: 5 (ref 5.0–8.0)

## 2017-01-25 LAB — POC OCCULT BLOOD, ED: Fecal Occult Bld: NEGATIVE

## 2017-01-25 MED ORDER — LOPERAMIDE HCL 2 MG PO CAPS
4.0000 mg | ORAL_CAPSULE | Freq: Once | ORAL | Status: AC
Start: 2017-01-25 — End: 2017-01-25
  Administered 2017-01-25: 4 mg via ORAL
  Filled 2017-01-25: qty 2

## 2017-01-25 MED ORDER — ONDANSETRON 4 MG PO TBDP: 4.0000 mg | ORAL_TABLET | Freq: Three times a day (TID) | ORAL | 0 refills | Status: DC | PRN

## 2017-01-25 MED ORDER — ONDANSETRON HCL 4 MG/2ML IJ SOLN
4.0000 mg | Freq: Once | INTRAMUSCULAR | Status: AC
  Administered 2017-01-25: 4 mg via INTRAVENOUS
  Filled 2017-01-25: qty 2

## 2017-01-25 MED ORDER — SODIUM CHLORIDE 0.9 % IV BOLUS (SEPSIS)
1000.0000 mL | Freq: Once | INTRAVENOUS | Status: AC
  Administered 2017-01-25: 1000 mL via INTRAVENOUS

## 2017-01-25 MED ORDER — LOPERAMIDE HCL 2 MG PO CAPS: 2.0000 mg | ORAL_CAPSULE | Freq: Four times a day (QID) | ORAL | 0 refills | Status: DC | PRN

## 2017-01-25 NOTE — Discharge Instructions (Signed)
You were seen today for vomiting and abdominal pain. Your workup is reassuring. This may be related to a viral illness.  You will be discharged with nausea and diarrhea medication. Follow-up with her primary physician if symptoms worsen.

## 2017-01-25 NOTE — ED Notes (Signed)
MD at bedside. 

## 2017-01-25 NOTE — ED Provider Notes (Signed)
[facility] DEPT Provider Note   CSN: [id number] Arrival date & time: 01/24/17  2138  By signing my name below, I, Arianna Nassar, attest that this documentation has been prepared under the direction and in the presence of Merryl Hacker, MD.  Electronically Signed: Julien Nordmann, ED Scribe. 01/25/17. 3:01 AM.    History   Chief Complaint Chief Complaint  Patient presents with  . Diarrhea  . Abdominal Pain   The history is provided by the patient. No language interpreter was used.   HPI Comments: Audrey [name] is a 41 y.o. female who presents to the Emergency Department complaining of progressively worsening, 5.5/10, lower abdominal pain and bilateral lower back pain x today. She notes associated diarrhea x 1 day and reports seeing a "brown circle leaf and brown squiggly lines in my stool", subjective fever, nausea, vomiting, and dizziness. Pt says her dizziness was more like room-spinning even while lying down or standing. She describes her stool as a very dark stool. She has been taking tylenol to alleviate her symptoms without relief. Pt has not been around any sick contacts. Pt denies fever, hematemesis, urinary urgency, and urinary frequency.  Past Medical History:  Diagnosis Date  . Abortion history   . Anemia   . Asthma   . Fibroid tumor   . Hypertension   . Migraine headache   . Shingles     Patient Active Problem List   Diagnosis Date Noted  . Pleurisy 06/16/2016  . Chest pain 06/15/2016  . Paresthesia 03/18/2015  . Acute left-sided weakness 05/13/2013  . UNSPECIFIED VITAMIN D DEFICIENCY 06/06/2008  . VIRAL INFECTION 08/31/2007  . ALLERGIC RHINITIS 08/31/2007  . Asthma 08/31/2007    Past Surgical History:  Procedure Laterality Date  . CESAREAN SECTION    . CYSTECTOMY     breast    OB History    No data available       Home Medications    Prior to Admission medications   Medication Sig Start Date End Date Taking? Authorizing Provider    albuterol (PROVENTIL HFA;VENTOLIN HFA) 108 (90 BASE) MCG/ACT inhaler Inhale 2 puffs into the lungs 2 (two) times daily. For shortness of breath   Yes Historical Provider, MD  hydrochlorothiazide (HYDRODIURIL) 25 MG tablet Take 25 mg by mouth daily. 02/25/15  Yes Historical Provider, MD  ibuprofen (ADVIL,MOTRIN) 200 MG tablet Take 400 mg by mouth every 6 (six) hours as needed.   Yes Historical Provider, MD  diclofenac sodium (VOLTAREN) 1 % GEL Apply 2 g topically 4 (four) times daily. Patient not taking: Reported on 01/25/2017 09/19/16   Elmyra Ricks Pisciotta, PA-C  loperamide (IMODIUM) 2 MG capsule Take 1 capsule (2 mg total) by mouth 4 (four) times daily as needed for diarrhea or loose stools. 01/25/17   Merryl Hacker, MD  ondansetron (ZOFRAN ODT) 4 MG disintegrating tablet Take 1 tablet (4 mg total) by mouth every 8 (eight) hours as needed for nausea or vomiting. 01/25/17   Merryl Hacker, MD  triamcinolone cream (KENALOG) 0.1 % Apply 1 application topically 2 (two) times daily. Patient not taking: Reported on 01/25/2017 02/17/16   Janne Napoleon, NP    Family History Family History  Problem Relation Age of Onset  . Hypertension Father     Living, 66  . High Cholesterol Father   . Hypertension Mother   . Non-Hodgkin's lymphoma Mother     Deceased, 56  . Cancer Maternal Grandfather   . Cancer Paternal Grandfather   .  Heart disease Maternal Uncle   . Heart disease Paternal Uncle   . Healthy Brother   . Asthma Son   . Asthma Daughter     Social History Social History  Substance Use Topics  . Smoking status: Never Smoker  . Smokeless tobacco: Never Used  . Alcohol use No     Allergies   Macrolides and ketolides; Penicillins; Shellfish allergy; Advair diskus [fluticasone-salmeterol]; Erythromycin; Lisinopril; Nyamyc [nystatin]; and Asa [aspirin]   Review of Systems Review of Systems  Constitutional: Negative for fever.  Respiratory: Negative for shortness of breath.    Cardiovascular: Negative for chest pain.  Gastrointestinal: Positive for abdominal pain, diarrhea, nausea and vomiting.  Genitourinary: Negative for dysuria, frequency, hematuria and urgency.  All other systems reviewed and are negative.    Physical Exam Updated Vital Signs BP (!) 147/90   Pulse 73   Temp 98.5 F (36.9 C) (Oral)   Resp 16   SpO2 98%   Physical Exam  Constitutional: She is oriented to person, place, and time. She appears well-developed and well-nourished. No distress.  HENT:  Head: Normocephalic and atraumatic.  MM moist  Cardiovascular: Normal rate, regular rhythm and normal heart sounds.   Pulmonary/Chest: Effort normal. No respiratory distress. She has no wheezes.  Abdominal: Soft. Bowel sounds are normal. There is no tenderness. There is no guarding.  Genitourinary: Rectal exam shows guaiac negative stool.  Genitourinary Comments: Normal rectal tone, brown stool  Neurological: She is alert and oriented to person, place, and time.  Skin: Skin is warm and dry.  Psychiatric: She has a normal mood and affect.  Nursing note and vitals reviewed.    ED Treatments / Results  DIAGNOSTIC STUDIES: Oxygen Saturation is 100% on RA, normal by my interpretation.  COORDINATION OF CARE:  3:00 AM Discussed treatment plan with pt at bedside and pt agreed to plan.  Labs (all labs ordered are listed, but only abnormal results are displayed) Labs Reviewed  COMPREHENSIVE METABOLIC PANEL - Abnormal; Notable for the following:       Result Value   Glucose, Bld 115 (*)    All other components within normal limits  URINALYSIS, ROUTINE W REFLEX MICROSCOPIC - Abnormal; Notable for the following:    Leukocytes, UA MODERATE (*)    Bacteria, UA RARE (*)    Squamous Epithelial / LPF 0-5 (*)    All other components within normal limits  LIPASE, BLOOD  CBC  I-STAT BETA HCG BLOOD, ED ([facility], [facility], [facility] ONLY)  POC OCCULT BLOOD, ED    EKG  EKG Interpretation None        Radiology No results found.  Procedures Procedures (including critical care time)  Medications Ordered in ED Medications  sodium chloride 0.9 % bolus 1,000 mL (0 mLs Intravenous Stopped 01/25/17 0551)  loperamide (IMODIUM) capsule 4 mg (4 mg Oral Given 01/25/17 0353)  ondansetron (ZOFRAN) injection 4 mg (4 mg Intravenous Given 01/25/17 0353)     Initial Impression / Assessment and Plan / ED Course  I have reviewed the triage vital signs and the nursing notes.  Pertinent labs & imaging results that were available during my care of the patient were reviewed by me and considered in my medical decision making (see chart for details).     Patient presents with N/V, abdominal pain, change in stools.  Nontoxic. VS reassuring.  Nontoxic on exam.  Abdominal exam benign.  Work-up is neg.  GIven imodium and zofran.  Able to tolerate PO.  Supportive measures  at home.  Likely viral.  Follow-up with PCP.  After history, exam, and medical workup I feel the patient has been appropriately medically screened and is safe for discharge home. Pertinent diagnoses were discussed with the patient. Patient was given return precautions.   Final Clinical Impressions(s) / ED Diagnoses   Final diagnoses:  Non-intractable vomiting with nausea, unspecified vomiting type  Generalized abdominal pain   I personally performed the services described in this documentation, which was scribed in my presence. The recorded information has been reviewed and is accurate.  New Prescriptions Discharge Medication List as of 01/25/2017  5:48 AM    START taking these medications   Details  loperamide (IMODIUM) 2 MG capsule Take 1 capsule (2 mg total) by mouth 4 (four) times daily as needed for diarrhea or loose stools., Starting Wed 01/25/2017, Print    ondansetron (ZOFRAN ODT) 4 MG disintegrating tablet Take 1 tablet (4 mg total) by mouth every 8 (eight) hours as needed for nausea or vomiting., Starting Wed 01/25/2017,  Print         Merryl Hacker, MD 02/03/17 1435

## 2017-01-25 NOTE — ED Notes (Signed)
Patient calling nurses desk asking about wait times and when she will be called. Secretary updated patient on wait times and current status of the department.

## 2017-04-27 DIAGNOSIS — L810 Postinflammatory hyperpigmentation: Secondary | ICD-10-CM | POA: Insufficient documentation

## 2017-04-27 DIAGNOSIS — L669 Cicatricial alopecia, unspecified: Secondary | ICD-10-CM | POA: Insufficient documentation

## 2017-04-27 HISTORY — DX: Postinflammatory hyperpigmentation: L81.0

## 2017-04-27 HISTORY — DX: Cicatricial alopecia, unspecified: L66.9

## 2018-01-09 DIAGNOSIS — L281 Prurigo nodularis: Secondary | ICD-10-CM

## 2018-01-09 HISTORY — DX: Prurigo nodularis: L28.1

## 2018-03-26 ENCOUNTER — Encounter (HOSPITAL_COMMUNITY): Payer: Self-pay | Admitting: Emergency Medicine

## 2018-03-26 ENCOUNTER — Ambulatory Visit (HOSPITAL_COMMUNITY)
Admission: EM | Admit: 2018-03-26 | Discharge: 2018-03-26 | Disposition: A | Discharge status: Home / Self Care | Payer: 59 | Attending: Family Medicine | Admitting: Family Medicine

## 2018-03-26 DIAGNOSIS — L308 Other specified dermatitis: Secondary | ICD-10-CM | POA: Diagnosis not present

## 2018-03-26 MED ORDER — PREDNISONE 20 MG PO TABS: ORAL_TABLET | ORAL | 0 refills | Status: DC

## 2018-03-26 MED ORDER — CLOBETASOL PROPIONATE 0.05 % EX OINT: 1.0000 "application " | TOPICAL_OINTMENT | Freq: Two times a day (BID) | CUTANEOUS | 0 refills | Status: DC

## 2018-03-26 MED ORDER — HYDROXYZINE HCL 25 MG PO TABS: 25.0000 mg | ORAL_TABLET | Freq: Four times a day (QID) | ORAL | 0 refills | Status: DC

## 2018-03-26 NOTE — Discharge Instructions (Signed)
Take the prednisone for 10 days Use the cortisone cream on the rash Take the hydroxyzine as needed for itching Follow up with allergy specialist

## 2018-03-26 NOTE — ED Triage Notes (Signed)
Pt c/o rash on chest and neck since Friday.

## 2018-03-26 NOTE — ED Provider Notes (Signed)
[place]    CSN: [id number] Arrival date & time: 03/26/18  0932     History   Chief Complaint Chief Complaint  Patient presents with  . Rash    HPI Audrey [name] is a 42 y.o. female.   HPI\ Patient has multiple drug allergies.  Some food allergies.  Multiple environmental allergies.  Is under the care of her PCP and an allergy specialist.  She states she is allergic to Benadryl.  She is allergic to Flonase. She is here because of a rash on her neck.  It is terribly itchy.  It is getting worse.  She has some soft tissue swelling.  She would like to know what to use on her rash. It is been present for over a week.  It feels like it is getting worse.  No new soap lotion powder or product.  Patient states she is very careful about what she eats, drinks, and uses on her skin.  No new medicines or supplements Looks like a contact dermatitis.  She also has some eczema.  I discussed treatment with her. We will going to give her an antihistamine for the itching.  I am going to give her a few days of prednisone to take down the swelling and rash.  Also I stronger cortisone cream for symptoms. No shortness of breath, wheezing, or difficulty breathing.  She is compliant with her medications.  Past Medical History:  Diagnosis Date  . Abortion history   . Anemia   . Asthma   . Fibroid tumor   . Hypertension   . Migraine headache   . Shingles     Patient Active Problem List   Diagnosis Date Noted  . Pleurisy 06/16/2016  . Chest pain 06/15/2016  . Paresthesia 03/18/2015  . Acute left-sided weakness 05/13/2013  . UNSPECIFIED VITAMIN D DEFICIENCY 06/06/2008  . VIRAL INFECTION 08/31/2007  . ALLERGIC RHINITIS 08/31/2007  . Asthma 08/31/2007    Past Surgical History:  Procedure Laterality Date  . CESAREAN SECTION    . CYSTECTOMY     breast    OB History   None      Home Medications    Prior to Admission medications   Medication Sig Start Date End Date  Taking? Authorizing Provider  albuterol (PROVENTIL HFA;VENTOLIN HFA) 108 (90 BASE) MCG/ACT inhaler Inhale 2 puffs into the lungs 2 (two) times daily. For shortness of breath    [provider]  clobetasol ointment (TEMOVATE) 3.55 % Apply 1 application topically 2 (two) times daily. 03/26/18   Raylene Everts, MD  hydrochlorothiazide (HYDRODIURIL) 25 MG tablet Take 25 mg by mouth daily. 02/25/15   [provider]  hydrOXYzine (ATARAX/VISTARIL) 25 MG tablet Take 1 tablet (25 mg total) by mouth every 6 (six) hours. For itching 03/26/18   Raylene Everts, MD  ibuprofen (ADVIL,MOTRIN) 200 MG tablet Take 400 mg by mouth every 6 (six) hours as needed.    [provider]  loperamide (IMODIUM) 2 MG capsule Take 1 capsule (2 mg total) by mouth 4 (four) times daily as needed for diarrhea or loose stools. 01/25/17   Horton, Barbette Hair, MD  ondansetron (ZOFRAN ODT) 4 MG disintegrating tablet Take 1 tablet (4 mg total) by mouth every 8 (eight) hours as needed for nausea or vomiting. 01/25/17   Horton, Barbette Hair, MD  predniSONE (DELTASONE) 20 MG tablet Take 2 a day for 5 days then one a day for 5 days 03/26/18   Blanchie Serve  Collie Siad, MD    Family History Family History  Problem Relation Age of Onset  . Hypertension Father        Living, 42  . High Cholesterol Father   . Hypertension Mother   . Non-Hodgkin's lymphoma Mother        Deceased, 74  . Cancer Maternal Grandfather   . Cancer Paternal Grandfather   . Heart disease Maternal Uncle   . Heart disease Paternal Uncle   . Healthy Brother   . Asthma Son   . Asthma Daughter     Social History Social History   Tobacco Use  . Smoking status: Never Smoker  . Smokeless tobacco: Never Used  Substance Use Topics  . Alcohol use: No    Alcohol/week: 0.0 oz  . Drug use: No     Allergies   Macrolides and ketolides; Penicillins; Shellfish allergy; Advair diskus [fluticasone-salmeterol]; Erythromycin; Lisinopril; Nyamyc  [nystatin]; and Asa [aspirin]   Review of Systems Review of Systems  Skin: Positive for rash.     Physical Exam Triage Vital Signs ED Triage Vitals [03/26/18 1935]  Enc Vitals Group     BP (!) 170/94     Pulse Rate 78     Resp 16     Temp 97.8 F (36.6 C)     Temp src      SpO2 100 %     Weight      Height      Head Circumference      Peak Flow      Pain Score      Pain Loc      Pain Edu?      Excl. in [place]?    No data found.  Updated Vital Signs BP (!) 170/94   Pulse 78   Temp 97.8 F (36.6 C)   Resp 16   SpO2 100%   Visual Acuity Right Eye Distance:   Left Eye Distance:   Bilateral Distance:    Right Eye Near:   Left Eye Near:    Bilateral Near:     Physical Exam  Constitutional: She appears well-developed and well-nourished. No distress.  HENT:  Head: Normocephalic and atraumatic.  Mouth/Throat: Oropharynx is clear and moist.  Eyes: Pupils are equal, round, and reactive to light. Conjunctivae are normal.  Neck: Normal range of motion.  Cardiovascular: Normal rate, regular rhythm and normal heart sounds.  Pulmonary/Chest: Effort normal. No respiratory distress.  Abdominal: Soft. She exhibits no distension.  Musculoskeletal: Normal range of motion. She exhibits no edema.  Neurological: She is alert.  Skin: Skin is warm and dry.  Wide band of hyperpigmentation, erythema, and clustered vesicles around the neck, upper back, anterior chest.  Scattered excoriated nodules on both forearms noted.     UC Treatments / Results  Labs (all labs ordered are listed, but only abnormal results are displayed) Labs Reviewed - No data to display  EKG None  Radiology No results found.  Procedures Procedures (including critical care time)  Medications Ordered in UC Medications - No data to display  Initial Impression / Assessment and Plan / UC Course  I have reviewed the triage vital signs and the nursing notes.  Pertinent labs & imaging results that  were available during my care of the patient were reviewed by me and considered in my medical decision making (see chart for details).     Discussed with patient looks like a contact dermatitis.  This is an eczematous type of inflammatory skin reaction.  Treat with prednisone and antihistamines.  Cool compresses. Final Clinical Impressions(s) / UC Diagnoses   Final diagnoses:  Other eczema     Discharge Instructions     Take the prednisone for 10 days Use the cortisone cream on the rash Take the hydroxyzine as needed for itching Follow up with allergy specialist   ED Prescriptions    Medication Sig Dispense Auth. Provider   hydrOXYzine (ATARAX/VISTARIL) 25 MG tablet Take 1 tablet (25 mg total) by mouth every 6 (six) hours. For itching 30 tablet Raylene Everts, MD   clobetasol ointment (TEMOVATE) 7.71 % Apply 1 application topically 2 (two) times daily. 30 g Raylene Everts, MD   predniSONE (DELTASONE) 20 MG tablet Take 2 a day for 5 days then one a day for 5 days 15 tablet Raylene Everts, MD     Controlled Substance Prescriptions [place] Controlled Substance Registry consulted? Not Applicable   Raylene Everts, MD 03/26/18 2030

## 2018-06-11 ENCOUNTER — Emergency Department (HOSPITAL_BASED_OUTPATIENT_CLINIC_OR_DEPARTMENT_OTHER): Payer: 59

## 2018-06-11 ENCOUNTER — Emergency Department (HOSPITAL_BASED_OUTPATIENT_CLINIC_OR_DEPARTMENT_OTHER)
Admission: EM | Admit: 2018-06-11 | Discharge: 2018-06-11 | Disposition: A | Discharge status: Home / Self Care | Payer: 59 | Attending: Emergency Medicine | Admitting: Emergency Medicine

## 2018-06-11 ENCOUNTER — Encounter (HOSPITAL_BASED_OUTPATIENT_CLINIC_OR_DEPARTMENT_OTHER): Payer: Self-pay | Admitting: *Deleted

## 2018-06-11 ENCOUNTER — Other Ambulatory Visit: Payer: Self-pay

## 2018-06-11 DIAGNOSIS — J45998 Other asthma: Secondary | ICD-10-CM | POA: Diagnosis not present

## 2018-06-11 DIAGNOSIS — Z79899 Other long term (current) drug therapy: Secondary | ICD-10-CM | POA: Diagnosis not present

## 2018-06-11 DIAGNOSIS — R140 Abdominal distension (gaseous): Secondary | ICD-10-CM | POA: Diagnosis not present

## 2018-06-11 DIAGNOSIS — R2243 Localized swelling, mass and lump, lower limb, bilateral: Secondary | ICD-10-CM | POA: Diagnosis present

## 2018-06-11 DIAGNOSIS — R1110 Vomiting, unspecified: Secondary | ICD-10-CM

## 2018-06-11 DIAGNOSIS — R600 Localized edema: Secondary | ICD-10-CM | POA: Insufficient documentation

## 2018-06-11 DIAGNOSIS — R1011 Right upper quadrant pain: Secondary | ICD-10-CM | POA: Diagnosis not present

## 2018-06-11 DIAGNOSIS — R112 Nausea with vomiting, unspecified: Secondary | ICD-10-CM | POA: Diagnosis not present

## 2018-06-11 DIAGNOSIS — R0602 Shortness of breath: Secondary | ICD-10-CM | POA: Diagnosis not present

## 2018-06-11 DIAGNOSIS — I10 Essential (primary) hypertension: Secondary | ICD-10-CM | POA: Diagnosis not present

## 2018-06-11 DIAGNOSIS — R1900 Intra-abdominal and pelvic swelling, mass and lump, unspecified site: Secondary | ICD-10-CM | POA: Diagnosis not present

## 2018-06-11 DIAGNOSIS — R609 Edema, unspecified: Secondary | ICD-10-CM

## 2018-06-11 LAB — CBC WITH DIFFERENTIAL/PLATELET
Basophils Absolute: 0 10*3/uL (ref 0.0–0.1)
Basophils Relative: 0 %
Eosinophils Absolute: 0 10*3/uL (ref 0.0–0.7)
Eosinophils Relative: 0 %
HCT: 35.8 % — ABNORMAL LOW (ref 36.0–46.0)
Hemoglobin: 11.7 g/dL — ABNORMAL LOW (ref 12.0–15.0)
Lymphocytes Relative: 25 %
Lymphs Abs: 2.3 10*3/uL (ref 0.7–4.0)
MCH: 27.9 pg (ref 26.0–34.0)
MCHC: 32.7 g/dL (ref 30.0–36.0)
MCV: 85.2 fL (ref 78.0–100.0)
Monocytes Absolute: 0.7 10*3/uL (ref 0.1–1.0)
Monocytes Relative: 8 %
Neutro Abs: 6 10*3/uL (ref 1.7–7.7)
Neutrophils Relative %: 67 %
Platelets: 258 10*3/uL (ref 150–400)
RBC: 4.2 MIL/uL (ref 3.87–5.11)
RDW: 13.8 % (ref 11.5–15.5)
WBC: 9 10*3/uL (ref 4.0–10.5)

## 2018-06-11 LAB — LIPASE, BLOOD: Lipase: 27 U/L (ref 11–51)

## 2018-06-11 LAB — BRAIN NATRIURETIC PEPTIDE: B Natriuretic Peptide: 26.2 pg/mL (ref 0.0–100.0)

## 2018-06-11 LAB — BASIC METABOLIC PANEL
Anion gap: 9 (ref 5–15)
BUN: 16 mg/dL (ref 6–20)
CO2: 22 mmol/L (ref 22–32)
Calcium: 8.8 mg/dL — ABNORMAL LOW (ref 8.9–10.3)
Chloride: 106 mmol/L (ref 98–111)
Creatinine, Ser: 0.78 mg/dL (ref 0.44–1.00)
GFR calc Af Amer: >60 mL/min (ref >60)
GFR calc non Af Amer: >60 mL/min (ref >60)
Glucose, Bld: 95 mg/dL (ref 70–99)
Potassium: 3.5 mmol/L (ref 3.5–5.1)
Sodium: 137 mmol/L (ref 135–145)

## 2018-06-11 LAB — HEPATIC FUNCTION PANEL
ALT: 18 U/L (ref 0–44)
AST: 16 U/L (ref 15–41)
Albumin: 3.6 g/dL (ref 3.5–5.0)
Alkaline Phosphatase: 65 U/L (ref 38–126)
Bilirubin, Direct: 0.1 mg/dL (ref 0.0–0.2)
Indirect Bilirubin: 0.8 mg/dL (ref 0.3–0.9)
Total Bilirubin: 0.9 mg/dL (ref 0.3–1.2)
Total Protein: 7.8 g/dL (ref 6.5–8.1)

## 2018-06-11 LAB — PREGNANCY, URINE: Preg Test, Ur: NEGATIVE

## 2018-06-11 MED ORDER — OMEPRAZOLE 20 MG PO CPDR: 20.0000 mg | DELAYED_RELEASE_CAPSULE | Freq: Every day | ORAL | 0 refills | Status: DC

## 2018-06-11 MED ORDER — SUCRALFATE 1 G PO TABS: 1.0000 g | ORAL_TABLET | Freq: Three times a day (TID) | ORAL | 0 refills | Status: DC

## 2018-06-11 NOTE — ED Notes (Signed)
Pt talking on cell phone, informed that I have her d/c instructions and that we cannot fill out FMLA paperwork as requested. Pt states "I'm talking to my job about the FMLA right now, can you come back in a little while?" pt asked to pull her call light when she is ready to talk with me.

## 2018-06-11 NOTE — Discharge Instructions (Addendum)
Continue to elevate your legs when at home.  Continue to increase her activity.  Your test today showed no blood clots in your leg, no blood work abnormality, and no signs of a heart condition.  Avoid sodium in your diet.  Follow-up with family doctor for recheck.  Also follow with gastroneurology regarding your nausea and vomiting.  Take Prilosec and Carafate daily to help with your symptoms.  Return as needed.

## 2018-06-11 NOTE — ED Notes (Signed)
Pt cont talking on cell phone. When informed that she is ready for d/c home, states "I need to finish this call first."

## 2018-06-11 NOTE — ED Provider Notes (Signed)
[facility] Provider Note   CSN: [id number] Arrival date & time: 06/11/18  1146     History   Chief Complaint Chief Complaint  Patient presents with  . Leg Swelling    HPI Audrey [name] is a 42 y.o. female.  HPI Audrey [name] is a 42 y.o. female with history of asthma, hypertension, migraines, presents to emergency department with multiple complaints.  Patient main complaint is swelling in her bilateral ankles, left lower leg and abdomen.  She states swelling has been gone for 2 weeks.  She has made an appoint with her doctor but it is not until next month.  She states today the swelling in her left lower leg got worse so she came here from work.  She reports pain in that leg that is intermittent and worse when it is not elevated.  She denies increased pain with walking or any activity.  She states she has been trying to elevate her legs has tried stockings with no relief of swelling.  She also reports persistent nausea and vomiting for the same amount of time especially after eating with right upper quadrant pain.  This pain radiates all over her abdomen.  She states she is not able to eat anything but salting crackers and drink fluids.  Denies any fever or chills.  She denies any diarrhea or changes in her bowels.  States she has been eating very healthy and reduce sodium diet.  She also reports intermittent shortness of breath.  No chest pain.  No cough or congestion.  No upper respiratory symptoms.  Past Medical History:  Diagnosis Date  . Abortion history   . Anemia   . Asthma   . Fibroid tumor   . Hypertension   . Migraine headache   . Shingles     Patient Active Problem List   Diagnosis Date Noted  . Pleurisy 06/16/2016  . Chest pain 06/15/2016  . Paresthesia 03/18/2015  . Acute left-sided weakness 05/13/2013  . UNSPECIFIED VITAMIN D DEFICIENCY 06/06/2008  . VIRAL INFECTION 08/31/2007  . ALLERGIC RHINITIS 08/31/2007  . Asthma  08/31/2007    Past Surgical History:  Procedure Laterality Date  . CESAREAN SECTION    . CYSTECTOMY     breast     OB History   None      Home Medications    Prior to Admission medications   Medication Sig Start Date End Date Taking? Authorizing Provider  albuterol (PROVENTIL HFA;VENTOLIN HFA) 108 (90 BASE) MCG/ACT inhaler Inhale 2 puffs into the lungs 2 (two) times daily. For shortness of breath   Yes [provider]  clobetasol ointment (TEMOVATE) 0.81 % Apply 1 application topically 2 (two) times daily. 03/26/18  Yes Raylene Everts, MD  hydrOXYzine (ATARAX/VISTARIL) 25 MG tablet Take 1 tablet (25 mg total) by mouth every 6 (six) hours. For itching 03/26/18  Yes Raylene Everts, MD  ibuprofen (ADVIL,MOTRIN) 200 MG tablet Take 400 mg by mouth every 6 (six) hours as needed.   Yes [provider]  hydrochlorothiazide (HYDRODIURIL) 25 MG tablet Take 25 mg by mouth daily. 02/25/15   [provider]  loperamide (IMODIUM) 2 MG capsule Take 1 capsule (2 mg total) by mouth 4 (four) times daily as needed for diarrhea or loose stools. 01/25/17   Horton, Barbette Hair, MD  ondansetron (ZOFRAN ODT) 4 MG disintegrating tablet Take 1 tablet (4 mg total) by mouth every 8 (eight) hours as needed for nausea or vomiting.  01/25/17   Horton, Barbette Hair, MD  predniSONE (DELTASONE) 20 MG tablet Take 2 a day for 5 days then one a day for 5 days 03/26/18   Raylene Everts, MD    Family History Family History  Problem Relation Age of Onset  . Hypertension Father        Living, 61  . High Cholesterol Father   . Hypertension Mother   . Non-Hodgkin's lymphoma Mother        Deceased, 33  . Cancer Maternal Grandfather   . Cancer Paternal Grandfather   . Heart disease Maternal Uncle   . Heart disease Paternal Uncle   . Healthy Brother   . Asthma Son   . Asthma Daughter     Social History Social History   Tobacco Use  . Smoking status: Never Smoker  . Smokeless  tobacco: Never Used  Substance Use Topics  . Alcohol use: No    Alcohol/week: 0.0 standard drinks  . Drug use: No     Allergies   Macrolides and ketolides; Penicillins; Shellfish allergy; Advair diskus [fluticasone-salmeterol]; Erythromycin; Lisinopril; Nyamyc [nystatin]; and Asa [aspirin]   Review of Systems Review of Systems  Constitutional: Negative for chills and fever.  Respiratory: Positive for shortness of breath. Negative for cough and chest tightness.   Cardiovascular: Positive for leg swelling. Negative for chest pain and palpitations.  Gastrointestinal: Positive for abdominal distention, abdominal pain, nausea and vomiting. Negative for diarrhea.  Genitourinary: Negative for dysuria, flank pain and pelvic pain.  Musculoskeletal: Negative for arthralgias, myalgias, neck pain and neck stiffness.  Skin: Negative for rash.  Neurological: Negative for dizziness, weakness and headaches.  All other systems reviewed and are negative.    Physical Exam Updated Vital Signs BP (!) 152/91 (BP Location: Right Arm)   Pulse 74   Temp 98.3 F (36.8 C) (Oral)   Resp 18   Ht 5' (1.524 m)   LMP 06/04/2018   SpO2 100%   BMI 41.21 kg/m   Physical Exam  Constitutional: She appears well-developed and well-nourished. No distress.  HENT:  Head: Normocephalic.  Eyes: Conjunctivae are normal.  Neck: Neck supple.  Cardiovascular: Normal rate, regular rhythm and normal heart sounds.  Pulmonary/Chest: Effort normal and breath sounds normal. No respiratory distress. She has no wheezes. She has no rales.  Abdominal: Soft. Bowel sounds are normal. She exhibits no distension. There is tenderness. There is no rebound.  Right upper quadrant tenderness.  Normal bowel sounds.  No obvious distention.  Musculoskeletal: She exhibits edema.  Trace edema bilateral ankles and feet.  There is 2+ pitting edema in the left shin.  No calf tenderness.  Negative Homans sign.  Neurological: She is alert.    Skin: Skin is warm and dry.  Psychiatric: She has a normal mood and affect. Her behavior is normal.  Nursing note and vitals reviewed.    ED Treatments / Results  Labs (all labs ordered are listed, but only abnormal results are displayed) Labs Reviewed  CBC WITH DIFFERENTIAL/PLATELET - Abnormal; Notable for the following components:      Result Value   Hemoglobin 11.7 (*)    HCT 35.8 (*)    All other components within normal limits  BASIC METABOLIC PANEL - Abnormal; Notable for the following components:   Calcium 8.8 (*)    All other components within normal limits  HEPATIC FUNCTION PANEL  LIPASE, BLOOD  BRAIN NATRIURETIC PEPTIDE  PREGNANCY, URINE    EKG None  Radiology Dg Chest 2  View  Result Date: 06/11/2018 CLINICAL DATA:  Leg and abdominal swelling. EXAM: CHEST - 2 VIEW COMPARISON:  05/03/2016 FINDINGS: Stable calcification in the right lower chest. Otherwise, the lungs are clear. Heart and mediastinum are within normal limits. Trachea is midline. No large pleural effusions. Bone structures are unremarkable. IMPRESSION: No active cardiopulmonary disease. Electronically Signed   By: Markus Daft M.D.   On: 06/11/2018 12:43   US Venous Img Lower Unilateral Left  Result Date: 06/11/2018 CLINICAL DATA:  Left lower extremity pain and edema. EXAM: LEFT LOWER EXTREMITY VENOUS DOPPLER ULTRASOUND TECHNIQUE: Gray-scale sonography with graded compression, as well as color Doppler and duplex ultrasound were performed to evaluate the lower extremity deep venous systems from the level of the common femoral vein and including the common femoral, femoral, profunda femoral, popliteal and calf veins including the posterior tibial, peroneal and gastrocnemius veins when visible. The superficial great saphenous vein was also interrogated. Spectral Doppler was utilized to evaluate flow at rest and with distal augmentation maneuvers in the common femoral, femoral and popliteal veins. COMPARISON:   None. FINDINGS: Contralateral Common Femoral Vein: Respiratory phasicity is normal and symmetric with the symptomatic side. No evidence of thrombus. Normal compressibility. Common Femoral Vein: No evidence of thrombus. Normal compressibility, respiratory phasicity and response to augmentation. Saphenofemoral Junction: No evidence of thrombus. Normal compressibility and flow on color Doppler imaging. Profunda Femoral Vein: No evidence of thrombus. Normal compressibility and flow on color Doppler imaging. Femoral Vein: No evidence of thrombus. Normal compressibility, respiratory phasicity and response to augmentation. Popliteal Vein: No evidence of thrombus. Normal compressibility, respiratory phasicity and response to augmentation. Calf Veins: No evidence of thrombus. Normal compressibility and flow on color Doppler imaging. Superficial Great Saphenous Vein: No evidence of thrombus. Normal compressibility. Venous Reflux:  None. Other Findings: No evidence of superficial thrombophlebitis or abnormal fluid collection. IMPRESSION: No evidence of left lower extremity deep venous thrombosis. Electronically Signed   By: Aletta Edouard M.D.   On: 06/11/2018 16:49   US Abdomen Limited Ruq  Result Date: 06/11/2018 CLINICAL DATA:  Right upper quadrant pain with nausea and bloating EXAM: ULTRASOUND ABDOMEN LIMITED RIGHT UPPER QUADRANT COMPARISON:  04/03/2015 FINDINGS: Gallbladder: No gallstones or wall thickening visualized. No sonographic Murphy sign noted by sonographer. Common bile duct: Diameter: 4 mm Liver: No focal lesion identified. Within normal limits in parenchymal echogenicity. Portal vein is patent on color Doppler imaging with normal direction of blood flow towards the liver. IMPRESSION: Normal right upper quadrant ultrasound. Electronically Signed   By: Monte Fantasia M.D.   On: 06/11/2018 16:41    Procedures Procedures (including critical care time)  Medications Ordered in ED Medications - No data to  display   Initial Impression / Assessment and Plan / ED Course  I have reviewed the triage vital signs and the nursing notes.  Pertinent labs & imaging results that were available during my care of the patient were reviewed by me and considered in my medical decision making (see chart for details).     Patient with multiple complaints.  I think her main complaints are swelling in her legs with left leg being worse than right.  Also complaining of nausea, vomiting for 2 weeks with right upper quadrant pain.  Is to the point where she unable to eat anything but salting crackers.  Will get labs, ultrasound of her right upper quadrant to rule out cholecystitis, will get venous duplex of the left lower leg to rule out DVT, she states she  has history of family members with blood clots, will also get BNP.  Looks like they already obtained chest x-ray which is normal.  5:20 PM Patient's labs are all within normal including lipase and LFTs.  BNP is normal as well.  Ultrasound of the right upper quadrant showed normal gallbladder.  Venous duplex of the left leg is normal as well.  Question whether patient may be eating too much salt or maybe has dependent edema or venous insufficiency.   Vitals:   06/11/18 1200 06/11/18 1203 06/11/18 1427  BP:  (!) 155/93 (!) 152/91  Pulse:  74 74  Resp:  20 18  Temp:  98.3 F (36.8 C)   TempSrc:  Oral   SpO2:  100% 100%  Height: 5' (1.524 m)       Final Clinical Impressions(s) / ED Diagnoses   Final diagnoses:  Vomiting  RUQ pain  Peripheral edema    ED Discharge Orders         Ordered    sucralfate (CARAFATE) 1 g tablet  3 times daily with meals & bedtime     06/11/18 1718    omeprazole (PRILOSEC) 20 MG capsule  Daily     06/11/18 1718           Jeannett Senior, PA-C 06/11/18 1723    Fredia Sorrow, MD 06/17/18 [phone]

## 2018-06-11 NOTE — ED Triage Notes (Signed)
Ankles and abdomen swelling x 2 weeks. Full pressure feeling in her abdomen. SOB.

## 2018-06-12 ENCOUNTER — Other Ambulatory Visit: Payer: Self-pay | Admitting: Gastroenterology

## 2018-06-12 DIAGNOSIS — R635 Abnormal weight gain: Secondary | ICD-10-CM | POA: Diagnosis not present

## 2018-06-12 DIAGNOSIS — Z1211 Encounter for screening for malignant neoplasm of colon: Secondary | ICD-10-CM | POA: Diagnosis not present

## 2018-06-12 DIAGNOSIS — R194 Change in bowel habit: Secondary | ICD-10-CM | POA: Diagnosis not present

## 2018-06-12 DIAGNOSIS — R140 Abdominal distension (gaseous): Secondary | ICD-10-CM

## 2018-06-12 DIAGNOSIS — R1011 Right upper quadrant pain: Secondary | ICD-10-CM

## 2018-06-12 DIAGNOSIS — K625 Hemorrhage of anus and rectum: Secondary | ICD-10-CM | POA: Diagnosis not present

## 2018-06-22 DIAGNOSIS — K635 Polyp of colon: Secondary | ICD-10-CM | POA: Diagnosis not present

## 2018-06-22 DIAGNOSIS — Z1211 Encounter for screening for malignant neoplasm of colon: Secondary | ICD-10-CM | POA: Diagnosis not present

## 2018-06-22 DIAGNOSIS — K3189 Other diseases of stomach and duodenum: Secondary | ICD-10-CM | POA: Diagnosis not present

## 2018-06-22 DIAGNOSIS — K210 Gastro-esophageal reflux disease with esophagitis: Secondary | ICD-10-CM | POA: Diagnosis not present

## 2018-06-22 DIAGNOSIS — R194 Change in bowel habit: Secondary | ICD-10-CM | POA: Diagnosis not present

## 2018-06-22 DIAGNOSIS — K921 Melena: Secondary | ICD-10-CM | POA: Diagnosis not present

## 2018-06-22 DIAGNOSIS — R1013 Epigastric pain: Secondary | ICD-10-CM | POA: Diagnosis not present

## 2018-06-22 DIAGNOSIS — K625 Hemorrhage of anus and rectum: Secondary | ICD-10-CM | POA: Diagnosis not present

## 2018-06-22 DIAGNOSIS — D122 Benign neoplasm of ascending colon: Secondary | ICD-10-CM | POA: Diagnosis not present

## 2018-07-05 DIAGNOSIS — R1310 Dysphagia, unspecified: Secondary | ICD-10-CM | POA: Diagnosis not present

## 2018-07-05 DIAGNOSIS — Z86010 Personal history of colonic polyps: Secondary | ICD-10-CM | POA: Diagnosis not present

## 2018-07-05 DIAGNOSIS — K219 Gastro-esophageal reflux disease without esophagitis: Secondary | ICD-10-CM | POA: Diagnosis not present

## 2018-07-19 DIAGNOSIS — K921 Melena: Secondary | ICD-10-CM | POA: Diagnosis not present

## 2018-07-19 DIAGNOSIS — I10 Essential (primary) hypertension: Secondary | ICD-10-CM | POA: Diagnosis not present

## 2018-07-19 DIAGNOSIS — K582 Mixed irritable bowel syndrome: Secondary | ICD-10-CM | POA: Diagnosis not present

## 2018-07-19 DIAGNOSIS — R1311 Dysphagia, oral phase: Secondary | ICD-10-CM | POA: Diagnosis not present

## 2018-07-21 DIAGNOSIS — Z23 Encounter for immunization: Secondary | ICD-10-CM | POA: Diagnosis not present

## 2018-07-23 ENCOUNTER — Ambulatory Visit (INDEPENDENT_AMBULATORY_CARE_PROVIDER_SITE_OTHER): Payer: 59 | Admitting: Otolaryngology

## 2018-08-16 DIAGNOSIS — I10 Essential (primary) hypertension: Secondary | ICD-10-CM | POA: Diagnosis not present

## 2018-08-16 DIAGNOSIS — R1311 Dysphagia, oral phase: Secondary | ICD-10-CM | POA: Diagnosis not present

## 2018-08-16 DIAGNOSIS — R6881 Early satiety: Secondary | ICD-10-CM | POA: Diagnosis not present

## 2018-08-16 DIAGNOSIS — R221 Localized swelling, mass and lump, neck: Secondary | ICD-10-CM | POA: Diagnosis not present

## 2018-08-16 DIAGNOSIS — R1310 Dysphagia, unspecified: Secondary | ICD-10-CM | POA: Diagnosis not present

## 2018-08-20 DIAGNOSIS — H93293 Other abnormal auditory perceptions, bilateral: Secondary | ICD-10-CM | POA: Diagnosis not present

## 2018-08-27 ENCOUNTER — Ambulatory Visit (INDEPENDENT_AMBULATORY_CARE_PROVIDER_SITE_OTHER): Payer: 59 | Admitting: Otolaryngology

## 2018-08-31 DIAGNOSIS — R221 Localized swelling, mass and lump, neck: Secondary | ICD-10-CM | POA: Diagnosis not present

## 2018-11-27 DIAGNOSIS — H5201 Hypermetropia, right eye: Secondary | ICD-10-CM | POA: Diagnosis not present

## 2018-11-27 DIAGNOSIS — H52222 Regular astigmatism, left eye: Secondary | ICD-10-CM | POA: Diagnosis not present

## 2018-11-27 DIAGNOSIS — H43393 Other vitreous opacities, bilateral: Secondary | ICD-10-CM | POA: Diagnosis not present

## 2018-11-27 DIAGNOSIS — H04123 Dry eye syndrome of bilateral lacrimal glands: Secondary | ICD-10-CM | POA: Diagnosis not present

## 2019-01-22 DIAGNOSIS — M25522 Pain in left elbow: Secondary | ICD-10-CM | POA: Diagnosis not present

## 2019-01-22 DIAGNOSIS — M542 Cervicalgia: Secondary | ICD-10-CM | POA: Diagnosis not present

## 2019-01-29 DIAGNOSIS — M25522 Pain in left elbow: Secondary | ICD-10-CM | POA: Diagnosis not present

## 2019-02-21 DIAGNOSIS — R05 Cough: Secondary | ICD-10-CM | POA: Diagnosis not present

## 2019-02-21 DIAGNOSIS — R6883 Chills (without fever): Secondary | ICD-10-CM | POA: Diagnosis not present

## 2019-02-21 DIAGNOSIS — J029 Acute pharyngitis, unspecified: Secondary | ICD-10-CM | POA: Diagnosis not present

## 2019-02-22 DIAGNOSIS — R110 Nausea: Secondary | ICD-10-CM | POA: Diagnosis not present

## 2019-02-22 DIAGNOSIS — K219 Gastro-esophageal reflux disease without esophagitis: Secondary | ICD-10-CM | POA: Diagnosis not present

## 2019-02-22 DIAGNOSIS — J029 Acute pharyngitis, unspecified: Secondary | ICD-10-CM | POA: Diagnosis not present

## 2019-02-22 DIAGNOSIS — R05 Cough: Secondary | ICD-10-CM | POA: Diagnosis not present

## 2019-02-22 DIAGNOSIS — Z20828 Contact with and (suspected) exposure to other viral communicable diseases: Secondary | ICD-10-CM | POA: Diagnosis not present

## 2019-02-28 DIAGNOSIS — R05 Cough: Secondary | ICD-10-CM | POA: Diagnosis not present

## 2019-02-28 DIAGNOSIS — Z20828 Contact with and (suspected) exposure to other viral communicable diseases: Secondary | ICD-10-CM | POA: Diagnosis not present

## 2019-02-28 DIAGNOSIS — R0602 Shortness of breath: Secondary | ICD-10-CM | POA: Diagnosis not present

## 2019-05-09 ENCOUNTER — Other Ambulatory Visit: Payer: Self-pay

## 2019-05-09 ENCOUNTER — Emergency Department (HOSPITAL_BASED_OUTPATIENT_CLINIC_OR_DEPARTMENT_OTHER)
Admission: EM | Admit: 2019-05-09 | Discharge: 2019-05-09 | Disposition: A | Discharge status: Home / Self Care | Payer: 59 | Attending: Emergency Medicine | Admitting: Emergency Medicine

## 2019-05-09 ENCOUNTER — Encounter (HOSPITAL_BASED_OUTPATIENT_CLINIC_OR_DEPARTMENT_OTHER): Payer: Self-pay | Admitting: Emergency Medicine

## 2019-05-09 DIAGNOSIS — Z79899 Other long term (current) drug therapy: Secondary | ICD-10-CM | POA: Diagnosis not present

## 2019-05-09 DIAGNOSIS — R197 Diarrhea, unspecified: Secondary | ICD-10-CM | POA: Insufficient documentation

## 2019-05-09 DIAGNOSIS — R112 Nausea with vomiting, unspecified: Secondary | ICD-10-CM | POA: Diagnosis not present

## 2019-05-09 DIAGNOSIS — R0989 Other specified symptoms and signs involving the circulatory and respiratory systems: Secondary | ICD-10-CM | POA: Diagnosis not present

## 2019-05-09 DIAGNOSIS — R519 Headache, unspecified: Secondary | ICD-10-CM

## 2019-05-09 DIAGNOSIS — I10 Essential (primary) hypertension: Secondary | ICD-10-CM | POA: Diagnosis not present

## 2019-05-09 DIAGNOSIS — G43909 Migraine, unspecified, not intractable, without status migrainosus: Secondary | ICD-10-CM | POA: Diagnosis not present

## 2019-05-09 DIAGNOSIS — R51 Headache: Secondary | ICD-10-CM | POA: Insufficient documentation

## 2019-05-09 DIAGNOSIS — J45909 Unspecified asthma, uncomplicated: Secondary | ICD-10-CM | POA: Insufficient documentation

## 2019-05-09 DIAGNOSIS — R1084 Generalized abdominal pain: Secondary | ICD-10-CM | POA: Insufficient documentation

## 2019-05-09 LAB — CBC WITH DIFFERENTIAL/PLATELET
Abs Immature Granulocytes: 0.02 10*3/uL (ref 0.00–0.07)
Basophils Absolute: 0 10*3/uL (ref 0.0–0.1)
Basophils Relative: 0 %
Eosinophils Absolute: 0 10*3/uL (ref 0.0–0.5)
Eosinophils Relative: 0 %
HCT: 37.8 % (ref 36.0–46.0)
Hemoglobin: 12 g/dL (ref 12.0–15.0)
Immature Granulocytes: 0 %
Lymphocytes Relative: 7 %
Lymphs Abs: 0.7 10*3/uL (ref 0.7–4.0)
MCH: 28 pg (ref 26.0–34.0)
MCHC: 31.7 g/dL (ref 30.0–36.0)
MCV: 88.1 fL (ref 80.0–100.0)
Monocytes Absolute: 0.4 10*3/uL (ref 0.1–1.0)
Monocytes Relative: 4 %
Neutro Abs: 8.7 10*3/uL — ABNORMAL HIGH (ref 1.7–7.7)
Neutrophils Relative %: 89 %
Platelets: 313 10*3/uL (ref 150–400)
RBC: 4.29 MIL/uL (ref 3.87–5.11)
RDW: 13.6 % (ref 11.5–15.5)
WBC: 9.8 10*3/uL (ref 4.0–10.5)
nRBC: 0 % (ref 0.0–0.2)

## 2019-05-09 LAB — COMPREHENSIVE METABOLIC PANEL
ALT: 23 U/L (ref 0–44)
AST: 20 U/L (ref 15–41)
Albumin: 3.7 g/dL (ref 3.5–5.0)
Alkaline Phosphatase: 65 U/L (ref 38–126)
Anion gap: 10 (ref 5–15)
BUN: 16 mg/dL (ref 6–20)
CO2: 23 mmol/L (ref 22–32)
Calcium: 9.3 mg/dL (ref 8.9–10.3)
Chloride: 103 mmol/L (ref 98–111)
Creatinine, Ser: 0.78 mg/dL (ref 0.44–1.00)
GFR calc Af Amer: >60 mL/min (ref >60)
GFR calc non Af Amer: >60 mL/min (ref >60)
Glucose, Bld: 110 mg/dL — ABNORMAL HIGH (ref 70–99)
Potassium: 3.7 mmol/L (ref 3.5–5.1)
Sodium: 136 mmol/L (ref 135–145)
Total Bilirubin: 0.7 mg/dL (ref 0.3–1.2)
Total Protein: 8.2 g/dL — ABNORMAL HIGH (ref 6.5–8.1)

## 2019-05-09 LAB — URINALYSIS, ROUTINE W REFLEX MICROSCOPIC
Bilirubin Urine: NEGATIVE
Glucose, UA: NEGATIVE mg/dL
Hgb urine dipstick: NEGATIVE
Ketones, ur: NEGATIVE mg/dL
Leukocytes,Ua: NEGATIVE
Nitrite: NEGATIVE
Protein, ur: NEGATIVE mg/dL
Specific Gravity, Urine: 1.02 (ref 1.005–1.030)
pH: 6.5 (ref 5.0–8.0)

## 2019-05-09 LAB — PREGNANCY, URINE: Preg Test, Ur: NEGATIVE

## 2019-05-09 LAB — LIPASE, BLOOD: Lipase: 33 U/L (ref 11–51)

## 2019-05-09 MED ORDER — HYDROXYZINE HCL 25 MG PO TABS
25.0000 mg | ORAL_TABLET | Freq: Once | ORAL | Status: AC
  Administered 2019-05-09: 15:00:00 25 mg via ORAL

## 2019-05-09 MED ORDER — METOCLOPRAMIDE HCL 5 MG/ML IJ SOLN
10.0000 mg | Freq: Once | INTRAMUSCULAR | Status: AC
  Administered 2019-05-09: 10 mg via INTRAVENOUS
  Filled 2019-05-09: qty 2

## 2019-05-09 MED ORDER — HYDROXYZINE HCL 10 MG PO TABS
10.0000 mg | ORAL_TABLET | Freq: Once | ORAL | Status: DC
  Filled 2019-05-09: qty 1

## 2019-05-09 MED ORDER — HYDROXYZINE HCL 25 MG PO TABS: 25.0000 mg | ORAL_TABLET | Freq: Four times a day (QID) | ORAL | 0 refills | Status: DC | PRN

## 2019-05-09 MED ORDER — HYDROXYZINE HCL 25 MG PO TABS
ORAL_TABLET | ORAL | Status: AC
  Filled 2019-05-09: qty 1

## 2019-05-09 MED ORDER — ONDANSETRON 4 MG PO TBDP: 4.0000 mg | ORAL_TABLET | Freq: Three times a day (TID) | ORAL | 0 refills | Status: DC | PRN

## 2019-05-09 MED ORDER — DIPHENHYDRAMINE HCL 50 MG/ML IJ SOLN
25.0000 mg | Freq: Once | INTRAMUSCULAR | Status: DC
  Filled 2019-05-09: qty 1

## 2019-05-09 MED ORDER — SODIUM CHLORIDE 0.9 % IV BOLUS
1000.0000 mL | Freq: Once | INTRAVENOUS | Status: AC
  Administered 2019-05-09: 1000 mL via INTRAVENOUS

## 2019-05-09 NOTE — ED Notes (Signed)
ED Provider at bedside. 

## 2019-05-09 NOTE — ED Triage Notes (Signed)
Vomiting since early this morning. Cannot keep anything down.  Diarrhea x2.  Also Headache.

## 2019-05-09 NOTE — Discharge Instructions (Addendum)
Please read instructions below. Drink clear liquids until your stomach feels better. Then, slowly introduce bland foods into your diet as tolerated, such as bread, rice, apples, bananas. You can take zofran every 8 hours as needed for nausea. Keep a log of your blood pressure over the next week to take to your primary care provider. Follow up with your primary care if symptoms persist. Return to the ER for severe abdominal pain, fever, uncontrollable vomiting, or new or concerning symptoms.

## 2019-05-09 NOTE — ED Notes (Signed)
Pt reported itching to throat after decadron administration. EDP informed. See [facility]

## 2019-05-09 NOTE — ED Provider Notes (Signed)
[facility] EMERGENCY DEPARTMENT Provider Note   CSN: [id number] Arrival date & time: 05/09/19  1251    History   Chief Complaint Chief Complaint  Patient presents with  . Emesis    HPI Audrey [name] is a 43 y.o. female w PMHx HTN, headaches, asthma, anemia, presenting to the ED with complaint of N/V that began this morning.  She states initially this morning she had a headache and then treated with Tylenol w some improvement.  When she got to work she began having multiple episodes of nausea and vomiting.  She has also had some nonbloody diarrhea.  She complains of intermittent mid abdominal pains.  She has had headaches in the past, however states no formal diagnosis of migraines.  She states she normally has headaches when she is ill with something else.  She did not have regular migraine headaches.  She reports for the last week she "crashes" after eating a meal and this is new for her.  She has not been seen by her primary care for this.  Denies associated fever, cough, URI symptoms, urinary symptoms.  No history of abdominal surgery.     The history is provided by the patient.    Past Medical History:  Diagnosis Date  . Abortion history   . Anemia   . Asthma   . Fibroid tumor   . Hypertension   . Migraine headache   . Shingles     Patient Active Problem List   Diagnosis Date Noted  . Pleurisy 06/16/2016  . Chest pain 06/15/2016  . Paresthesia 03/18/2015  . Acute left-sided weakness 05/13/2013  . UNSPECIFIED VITAMIN D DEFICIENCY 06/06/2008  . VIRAL INFECTION 08/31/2007  . ALLERGIC RHINITIS 08/31/2007  . Asthma 08/31/2007    Past Surgical History:  Procedure Laterality Date  . CESAREAN SECTION    . COLONOSCOPY, ESOPHAGOGASTRODUODENOSCOPY (EGD) AND ESOPHAGEAL DILATION    . CYSTECTOMY     breast     OB History   No obstetric history on file.      Home Medications    Prior to Admission medications   Medication Sig Start Date End Date Taking?  Authorizing Provider  albuterol (PROVENTIL HFA;VENTOLIN HFA) 108 (90 BASE) MCG/ACT inhaler Inhale 2 puffs into the lungs 2 (two) times daily. For shortness of breath    [provider]  clobetasol ointment (TEMOVATE) 4.25 % Apply 1 application topically 2 (two) times daily. 03/26/18   Raylene Everts, MD  hydrochlorothiazide (HYDRODIURIL) 25 MG tablet Take 25 mg by mouth daily. 02/25/15   [provider]  hydrOXYzine (ATARAX/VISTARIL) 25 MG tablet Take 1 tablet (25 mg total) by mouth every 6 (six) hours as needed. For itching 05/09/19   Robinson, [place] N, PA-C  ibuprofen (ADVIL,MOTRIN) 200 MG tablet Take 400 mg by mouth every 6 (six) hours as needed.    [provider]  loperamide (IMODIUM) 2 MG capsule Take 1 capsule (2 mg total) by mouth 4 (four) times daily as needed for diarrhea or loose stools. 01/25/17   Horton, Barbette Hair, MD  omeprazole (PRILOSEC) 20 MG capsule Take 1 capsule (20 mg total) by mouth daily. 06/11/18   Kirichenko, Lahoma Rocker, PA-C  ondansetron (ZOFRAN ODT) 4 MG disintegrating tablet Take 1 tablet (4 mg total) by mouth every 8 (eight) hours as needed for nausea or vomiting. 05/09/19   Robinson, [place] N, PA-C  predniSONE (DELTASONE) 20 MG tablet Take 2 a day for 5 days then one a day for 5 days  03/26/18   Raylene Everts, MD  sucralfate (CARAFATE) 1 g tablet Take 1 tablet (1 g total) by mouth 4 (four) times daily -  with meals and at bedtime. 06/11/18   Jeannett Senior, PA-C    Family History Family History  Problem Relation Age of Onset  . Hypertension Father        Living, 22  . High Cholesterol Father   . Hypertension Mother   . Non-Hodgkin's lymphoma Mother        Deceased, 23  . Cancer Maternal Grandfather   . Cancer Paternal Grandfather   . Heart disease Maternal Uncle   . Heart disease Paternal Uncle   . Healthy Brother   . Asthma Son   . Asthma Daughter     Social History Social History   Tobacco Use  . Smoking status: Never  Smoker  . Smokeless tobacco: Never Used  Substance Use Topics  . Alcohol use: No    Alcohol/week: 0.0 standard drinks  . Drug use: No     Allergies   Macrolides and ketolides, Penicillins, Shellfish allergy, Advair diskus [fluticasone-salmeterol], Erythromycin, Lisinopril, Nyamyc [nystatin], and Asa [aspirin]   Review of Systems Review of Systems  Constitutional: Negative for fever.  Eyes: Negative for photophobia and visual disturbance.  Respiratory: Negative for cough.   Gastrointestinal: Positive for abdominal pain, diarrhea and vomiting.  Genitourinary: Negative for dysuria and frequency.  Neurological: Positive for headaches. Negative for facial asymmetry, speech difficulty, weakness and numbness.  Hematological: Does not bruise/bleed easily.  All other systems reviewed and are negative.    Physical Exam Updated Vital Signs BP (!) 170/84   Pulse 84   Temp 98.9 F (37.2 C) (Oral)   Resp 18   Ht 5\' 1"  (1.549 m)   Wt 90.7 kg   LMP 05/01/2019   SpO2 99%   BMI 37.79 kg/m   Physical Exam Vitals signs and nursing note reviewed.  Constitutional:      General: She is not in acute distress.    Appearance: She is well-developed. She is obese. She is not ill-appearing.     Comments: Sleeping upon entering the room.  Easily arousable.  HENT:     Head: Normocephalic and atraumatic.  Eyes:     Extraocular Movements: Extraocular movements intact.     Conjunctiva/sclera: Conjunctivae normal.     Pupils: Pupils are equal, round, and reactive to light.  Cardiovascular:     Rate and Rhythm: Normal rate and regular rhythm.  Pulmonary:     Effort: Pulmonary effort is normal. No respiratory distress.     Breath sounds: Normal breath sounds.  Abdominal:     General: Bowel sounds are normal.     Palpations: Abdomen is soft.     Tenderness: There is generalized abdominal tenderness. There is no guarding or rebound.  Skin:    General: Skin is warm.  Neurological:     Mental  Status: She is alert.     Comments: Oriented.  Speech without aphasia.  Spontaneously moving all extremities without difficulty.  No obvious cranial nerve deficits.  Normal tone  Psychiatric:        Behavior: Behavior normal.      ED Treatments / Results  Labs (all labs ordered are listed, but only abnormal results are displayed) Labs Reviewed  COMPREHENSIVE METABOLIC PANEL - Abnormal; Notable for the following components:      Result Value   Glucose, Bld 110 (*)    Total Protein 8.2 (*)  All other components within normal limits  CBC WITH DIFFERENTIAL/PLATELET - Abnormal; Notable for the following components:   Neutro Abs 8.7 (*)    All other components within normal limits  LIPASE, BLOOD  URINALYSIS, ROUTINE W REFLEX MICROSCOPIC  PREGNANCY, URINE    EKG None  Radiology No results found.  Procedures Procedures (including critical care time)  Medications Ordered in ED Medications  sodium chloride 0.9 % bolus 1,000 mL (0 mLs Intravenous Stopped 05/09/19 1611)  metoCLOPramide (REGLAN) injection 10 mg (10 mg Intravenous Given 05/09/19 1417)  hydrOXYzine (ATARAX/VISTARIL) tablet 25 mg (25 mg Oral Given 05/09/19 1451)     Initial Impression / Assessment and Plan / ED Course  I have reviewed the triage vital signs and the nursing notes.  Pertinent labs & imaging results that were available during my care of the patient were reviewed by me and considered in my medical decision making (see chart for details).  Clinical Course as of May 08 2158  Thu May 09, 2019  1450 Pt reportedly had sx after initiation of reglan, however nursing reports reglan was diluted into 1L NS and it was just begun. After further discussion with patient, she is complaining more of anxiety. Likely common side effect of reglan. Will dose with hydroxyzine since pt states she had adverse rxn to benadryl.   [JR]  7425 Pt re-evaluated, reports improvement in sx. Discussed reassuring lab results. Plan for  discharge w symptomatic tx and PCP f/u.   [JR]    Clinical Course User Index [JR] Robinson, [place] N, PA-C       Pt presenting with N/V/D that began today, with assoc HA. No fevers or URI sx. Pt is overall well-appearing and in no distress. Hypertensive however no fever or tachycardia. Abd exam with generalized tenderness, no peritoneal signs. Treated with IV fluids and reglan with improvement. Labs are very reassuring, no leukocytosis, CMP unremarkable, lipase wnl, U/A neg. Do not believe advanced imaging is indicated at this time without abnl lab findings, abd exam is nonfocal, afebrile. Suspect viral etiology of sx and will discharge with symptomatic management for viral gastroenteritis.  Recommend patient keep a log of her blood pressure she is hypertensive here.  Patient also requested prescription for hydroxyzine to help with her seasonal allergy symptoms and she states she does not tolerate other over-the-counter allergy medications.  Provide small prescription.  Pt agreeable to plan and safe for discharge.  Discussed results, findings, treatment and follow up. Patient advised of return precautions. Patient verbalized understanding and agreed with plan.  Final Clinical Impressions(s) / ED Diagnoses   Final diagnoses:  Nausea vomiting and diarrhea  Acute nonintractable headache, unspecified headache type    ED Discharge Orders         Ordered    hydrOXYzine (ATARAX/VISTARIL) 25 MG tablet  Every 6 hours PRN     05/09/19 1621    ondansetron (ZOFRAN ODT) 4 MG disintegrating tablet  Every 8 hours PRN     05/09/19 1621           Robinson, [place] N, PA-C 05/09/19 2200    Maudie Flakes, MD 05/16/19 [phone]

## 2019-05-26 DIAGNOSIS — H52223 Regular astigmatism, bilateral: Secondary | ICD-10-CM | POA: Diagnosis not present

## 2019-08-01 ENCOUNTER — Encounter (HOSPITAL_COMMUNITY): Payer: Self-pay

## 2019-08-01 ENCOUNTER — Emergency Department (HOSPITAL_COMMUNITY)
Admission: EM | Admit: 2019-08-01 | Discharge: 2019-08-02 | Disposition: A | Discharge status: Home / Self Care | Payer: 59 | Attending: Emergency Medicine | Admitting: Emergency Medicine

## 2019-08-01 ENCOUNTER — Other Ambulatory Visit: Payer: Self-pay

## 2019-08-01 DIAGNOSIS — Z5321 Procedure and treatment not carried out due to patient leaving prior to being seen by health care provider: Secondary | ICD-10-CM | POA: Insufficient documentation

## 2019-08-01 DIAGNOSIS — R200 Anesthesia of skin: Secondary | ICD-10-CM | POA: Diagnosis present

## 2019-08-01 DIAGNOSIS — M79601 Pain in right arm: Secondary | ICD-10-CM | POA: Diagnosis not present

## 2019-08-01 NOTE — ED Triage Notes (Signed)
Pt coming from home c/o right arm numbness x1 week. Sharp pain when bending arm and hurts to touch. No neuro deficits or facial droop

## 2019-08-02 ENCOUNTER — Encounter (HOSPITAL_COMMUNITY): Payer: Self-pay

## 2019-08-02 ENCOUNTER — Ambulatory Visit (INDEPENDENT_AMBULATORY_CARE_PROVIDER_SITE_OTHER)
Admission: EM | Admit: 2019-08-02 | Discharge: 2019-08-02 | Disposition: A | Discharge status: Home / Self Care | Payer: 59 | Source: Home / Self Care | Attending: Family Medicine | Admitting: Family Medicine

## 2019-08-02 DIAGNOSIS — M79601 Pain in right arm: Secondary | ICD-10-CM

## 2019-08-02 DIAGNOSIS — M5410 Radiculopathy, site unspecified: Secondary | ICD-10-CM

## 2019-08-02 MED ORDER — PREDNISONE 20 MG PO TABS: ORAL_TABLET | ORAL | 0 refills | Status: DC

## 2019-08-02 NOTE — ED Provider Notes (Signed)
[facility]    CSN: [id number] Arrival date & time: 08/02/19  1213      History   Chief Complaint Chief Complaint  Patient presents with  . Arm Problem  . Back Pain  . Chest Pain  . Dizziness    HPI Audrey [name] is a 43 y.o. female.   HPI  Patient had right arm pain.  Had some numbness in her fingers.  Has some pain migrating down her arm.  Center neck to the arm to the forearm to the fingers.  She says she has decreased dexterity and grip strength.  This been coming and going for a week.  Yesterday the pain went into her neck and chest.  She felt slightly dizzy.  She went to the ER.  Waited 4 hours.  They were unable to see her so she left and came here today. Today denies chest pain or tightness.  Continues to have right arm pain.  No lightheadedness or dizziness.  No palpitations.  No falls.  Past Medical History:  Diagnosis Date  . Abortion history   . Anemia   . Asthma   . Fibroid tumor   . Hypertension   . Migraine headache   . Shingles     Patient Active Problem List   Diagnosis Date Noted  . Pleurisy 06/16/2016  . Chest pain 06/15/2016  . Paresthesia 03/18/2015  . Acute left-sided weakness 05/13/2013  . UNSPECIFIED VITAMIN D DEFICIENCY 06/06/2008  . VIRAL INFECTION 08/31/2007  . ALLERGIC RHINITIS 08/31/2007  . Asthma 08/31/2007    Past Surgical History:  Procedure Laterality Date  . CESAREAN SECTION    . COLONOSCOPY, ESOPHAGOGASTRODUODENOSCOPY (EGD) AND ESOPHAGEAL DILATION    . CYSTECTOMY     breast    OB History   No obstetric history on file.      Home Medications    Prior to Admission medications   Medication Sig Start Date End Date Taking? Authorizing Provider  OLMESARTAN MEDOXOMIL PO Take by mouth.   Yes [provider]  albuterol (PROVENTIL HFA;VENTOLIN HFA) 108 (90 BASE) MCG/ACT inhaler Inhale 2 puffs into the lungs 2 (two) times daily. For shortness of breath    [provider]  hydrochlorothiazide  (HYDRODIURIL) 25 MG tablet Take 25 mg by mouth daily. 02/25/15   [provider]  ibuprofen (ADVIL,MOTRIN) 200 MG tablet Take 400 mg by mouth every 6 (six) hours as needed.    [provider]  predniSONE (DELTASONE) 20 MG tablet Take 2 a day for 5 days then one a day for 5 days 08/02/19   Raylene Everts, MD  omeprazole (PRILOSEC) 20 MG capsule Take 1 capsule (20 mg total) by mouth daily. 06/11/18 08/02/19  Kirichenko, Lahoma Rocker, PA-C  sucralfate (CARAFATE) 1 g tablet Take 1 tablet (1 g total) by mouth 4 (four) times daily -  with meals and at bedtime. 06/11/18 08/02/19  Jeannett Senior, PA-C    Family History Family History  Problem Relation Age of Onset  . Hypertension Father        Living, 77  . High Cholesterol Father   . Hypertension Mother   . Non-Hodgkin's lymphoma Mother        Deceased, 2  . Cancer Maternal Grandfather   . Cancer Paternal Grandfather   . Heart disease Maternal Uncle   . Heart disease Paternal Uncle   . Healthy Brother   . Asthma Son   . Asthma Daughter     Social History Social History  Tobacco Use  . Smoking status: Never Smoker  . Smokeless tobacco: Never Used  Substance Use Topics  . Alcohol use: No    Alcohol/week: 0.0 standard drinks  . Drug use: No     Allergies   Macrolides and ketolides, Penicillins, Shellfish allergy, Advair diskus [fluticasone-salmeterol], Erythromycin, Lisinopril, Nyamyc [nystatin], Diphenhydramine hcl (sleep), Neomycin, Asa [aspirin], and Latex   Review of Systems Review of Systems  Constitutional: Negative for chills and fever.  HENT: Negative for ear pain and sore throat.   Eyes: Negative for pain and visual disturbance.  Respiratory: Negative for cough and shortness of breath.   Cardiovascular: Negative for chest pain and palpitations.  Gastrointestinal: Negative for abdominal pain and vomiting.  Genitourinary: Negative for dysuria and hematuria.  Musculoskeletal: Positive for myalgias.  Negative for arthralgias and back pain.  Skin: Negative for color change and rash.  Neurological: Positive for weakness and numbness. Negative for dizziness, seizures, syncope and facial asymmetry.  All other systems reviewed and are negative.    Physical Exam Triage Vital Signs ED Triage Vitals  Enc Vitals Group     BP 08/02/19 1244 (!) 153/93     Pulse Rate 08/02/19 1244 86     Resp 08/02/19 1244 16     Temp 08/02/19 1244 98.3 F (36.8 C)     Temp Source 08/02/19 1244 Oral     SpO2 08/02/19 1244 99 %     Weight --      Height --      Head Circumference --      Peak Flow --      Pain Score 08/02/19 1237 7     Pain Loc --      Pain Edu? --      Excl. in [place]? --    No data found.  Updated Vital Signs BP (!) 153/93 (BP Location: Left Arm)   Pulse 86   Temp 98.3 F (36.8 C) (Oral)   Resp 16   LMP 07/14/2019   SpO2 99%      Physical Exam Constitutional:      General: She is not in acute distress.    Appearance: She is well-developed. She is obese.  HENT:     Head: Normocephalic and atraumatic.  Eyes:     Conjunctiva/sclera: Conjunctivae normal.     Pupils: Pupils are equal, round, and reactive to light.  Neck:     Musculoskeletal: Normal range of motion and neck supple. Muscular tenderness present.     Vascular: No carotid bruit.     Comments: Mild tenderness at the CA 6 7 region just to the right of the spinous processes.  Tenderness in the upper body of the trapezius.  Good range of motion of the neck. Cardiovascular:     Rate and Rhythm: Normal rate.  Pulmonary:     Effort: Pulmonary effort is normal. No respiratory distress.  Abdominal:     General: There is no distension.     Palpations: Abdomen is soft.  Musculoskeletal: Normal range of motion.     Comments: Strength sensation range of motion reflexes are normal in both upper extremities.  Initial strength in right grip was poor but with encouragement it normalizes.  Lymphadenopathy:     Cervical: No  cervical adenopathy.  Skin:    General: Skin is warm and dry.  Neurological:     General: No focal deficit present.     Mental Status: She is alert.  Psychiatric:  Mood and Affect: Mood normal.      UC Treatments / Results  Labs (all labs ordered are listed, but only abnormal results are displayed) Labs Reviewed - No data to display  EKG   Radiology No results found.  Procedures Procedures (including critical care time)  Medications Ordered in UC Medications - No data to display  Initial Impression / Assessment and Plan / UC Course  I have reviewed the triage vital signs and the nursing notes.  Pertinent labs & imaging results that were available during my care of the patient were reviewed by me and considered in my medical decision making (see chart for details).    I believe the arm pain is a cervical radiculitis, nerve inflammatory problem.Discussed treatment with steroids.  Final Clinical Impressions(s) / UC Diagnoses   Final diagnoses:  Arm pain, right  Nerve root inflammation     Discharge Instructions     Take the prednisone as directed Take 2 pills today Continue warmth Tylenol for pain Rest See your PCP if problems continue   ED Prescriptions    Medication Sig Dispense Auth. Provider   predniSONE (DELTASONE) 20 MG tablet Take 2 a day for 5 days then one a day for 5 days 15 tablet Meda Coffee Jennette Banker, MD     PDMP not reviewed this encounter.   Raylene Everts, MD 08/02/19 [phone]

## 2019-08-02 NOTE — ED Notes (Signed)
Pt told registration she would go to UC in the morning.

## 2019-08-02 NOTE — ED Triage Notes (Addendum)
Pt states she was at the [facility] ED, but she left after 4 hrs without being seen. Pt states she has right arm pain and numbness x 1 week. Pt report back pain and chest  pain x 1 day. Pt states she started feeling lightheaded this morning.

## 2019-08-02 NOTE — Discharge Instructions (Addendum)
Take the prednisone as directed Take 2 pills today Continue warmth Tylenol for pain Rest See your PCP if problems continue

## 2019-10-24 DIAGNOSIS — Z03818 Encounter for observation for suspected exposure to other biological agents ruled out: Secondary | ICD-10-CM | POA: Diagnosis not present

## 2020-01-25 ENCOUNTER — Ambulatory Visit: Payer: 59 | Attending: Internal Medicine

## 2020-01-25 DIAGNOSIS — Z23 Encounter for immunization: Secondary | ICD-10-CM

## 2020-01-25 NOTE — Progress Notes (Signed)
   Covid-19 Vaccination Clinic  Name:  Audrey [name]    MRN: [id number] DOB: April 08, 1976  01/25/2020  Ms. Stones was observed post Covid-19 immunization for 15 minutes without incident. She was provided with Vaccine Information Sheet and instruction to access the V-Safe system.   [name] was instructed to call 911 with any severe reactions post vaccine: Marland Kitchen Difficulty breathing  . Swelling of face and throat  . A fast heartbeat  . A bad rash all over body  . Dizziness and weakness   Immunizations Administered    Name Date Dose VIS Date Route   Moderna COVID-19 Vaccine 01/25/2020 10:12 AM 0.5 mL 09/17/2019 Intramuscular   Manufacturer: Moderna   LotFP:[id number]   [id number]

## 2020-01-27 ENCOUNTER — Emergency Department (HOSPITAL_COMMUNITY)
Admission: EM | Admit: 2020-01-27 | Discharge: 2020-01-27 | Disposition: A | Discharge status: Home / Self Care | Payer: 59 | Attending: Emergency Medicine | Admitting: Emergency Medicine

## 2020-01-27 ENCOUNTER — Other Ambulatory Visit: Payer: Self-pay

## 2020-01-27 ENCOUNTER — Emergency Department (HOSPITAL_COMMUNITY): Payer: 59

## 2020-01-27 ENCOUNTER — Encounter (HOSPITAL_COMMUNITY): Payer: Self-pay | Admitting: Emergency Medicine

## 2020-01-27 DIAGNOSIS — J45909 Unspecified asthma, uncomplicated: Secondary | ICD-10-CM | POA: Insufficient documentation

## 2020-01-27 DIAGNOSIS — R05 Cough: Secondary | ICD-10-CM | POA: Insufficient documentation

## 2020-01-27 DIAGNOSIS — R0789 Other chest pain: Secondary | ICD-10-CM | POA: Diagnosis not present

## 2020-01-27 DIAGNOSIS — R197 Diarrhea, unspecified: Secondary | ICD-10-CM | POA: Insufficient documentation

## 2020-01-27 DIAGNOSIS — R109 Unspecified abdominal pain: Secondary | ICD-10-CM | POA: Diagnosis not present

## 2020-01-27 DIAGNOSIS — I10 Essential (primary) hypertension: Secondary | ICD-10-CM | POA: Insufficient documentation

## 2020-01-27 DIAGNOSIS — R002 Palpitations: Secondary | ICD-10-CM | POA: Diagnosis not present

## 2020-01-27 DIAGNOSIS — Z91040 Latex allergy status: Secondary | ICD-10-CM | POA: Insufficient documentation

## 2020-01-27 DIAGNOSIS — R0602 Shortness of breath: Secondary | ICD-10-CM | POA: Diagnosis not present

## 2020-01-27 LAB — BASIC METABOLIC PANEL
Anion gap: 12 (ref 5–15)
BUN: 14 mg/dL (ref 6–20)
CO2: 24 mmol/L (ref 22–32)
Calcium: 9.2 mg/dL (ref 8.9–10.3)
Chloride: 102 mmol/L (ref 98–111)
Creatinine, Ser: 0.97 mg/dL (ref 0.44–1.00)
GFR calc Af Amer: >60 mL/min (ref >60)
GFR calc non Af Amer: >60 mL/min (ref >60)
Glucose, Bld: 132 mg/dL — ABNORMAL HIGH (ref 70–99)
Potassium: 3.9 mmol/L (ref 3.5–5.1)
Sodium: 138 mmol/L (ref 135–145)

## 2020-01-27 LAB — TROPONIN I (HIGH SENSITIVITY)
Troponin I (High Sensitivity): 7 ng/L (ref <18)
Troponin I (High Sensitivity): 5 ng/L (ref <18)

## 2020-01-27 LAB — CBC
HCT: 38.1 % (ref 36.0–46.0)
Hemoglobin: 12 g/dL (ref 12.0–15.0)
MCH: 27.8 pg (ref 26.0–34.0)
MCHC: 31.5 g/dL (ref 30.0–36.0)
MCV: 88.2 fL (ref 80.0–100.0)
Platelets: 290 10*3/uL (ref 150–400)
RBC: 4.32 MIL/uL (ref 3.87–5.11)
RDW: 14.1 % (ref 11.5–15.5)
WBC: 9.1 10*3/uL (ref 4.0–10.5)
nRBC: 0 % (ref 0.0–0.2)

## 2020-01-27 LAB — I-STAT BETA HCG BLOOD, ED (MC, WL, AP ONLY): I-stat hCG, quantitative: <5 m[IU]/mL (ref <5)

## 2020-01-27 MED ORDER — SODIUM CHLORIDE 0.9% FLUSH: 3.0000 mL | Freq: Once | INTRAVENOUS | Status: DC

## 2020-01-27 MED ORDER — ALBUTEROL SULFATE HFA 108 (90 BASE) MCG/ACT IN AERS
2.0000 | INHALATION_SPRAY | Freq: Once | RESPIRATORY_TRACT | Status: AC
  Administered 2020-01-27: 2 via RESPIRATORY_TRACT
  Filled 2020-01-27: qty 6.7

## 2020-01-27 NOTE — ED Provider Notes (Signed)
[facility] EMERGENCY DEPARTMENT Provider Note   CSN: [id number] Arrival date & time: 01/27/20  [id number]     History Chief Complaint  Patient presents with  . Shortness of Breath    Audrey [name] is a 44 y.o. female.  The history is provided by the patient.  Shortness of Breath Severity:  Moderate Onset quality:  Sudden Timing:  Constant Progression:  Unchanged Chronicity:  New Relieved by:  None tried Worsened by:  Nothing Associated symptoms: abdominal pain, chest pain and cough   Associated symptoms: no fever   Patient with history of asthma, hypertension presents with multiple complaints.  She reports she received her first series of the COVID-19 vaccine approximately 2 days ago.  Soon after she began having diffuse abdominal pain and diarrhea.  She reports several hours ago while sleeping she began feeling short of breath and having palpitations.  She also reports she had some brief chest pain.  She reports cough.  No fever.  Denies known history of CAD/VTE  HPI: A 44 year old patient with a history of hypertension and obesity presents for evaluation of chest pain. Initial onset of pain was approximately 3-6 hours ago. The patient's chest pain is not worse with exertion. The patient's chest pain is middle- or left-sided, is not well-localized, is not described as heaviness/pressure/tightness, is not sharp and does not radiate to the arms/jaw/neck. The patient does not complain of nausea and denies diaphoresis. The patient has no history of stroke, has no history of peripheral artery disease, has not smoked in the past 90 days, denies any history of treated diabetes, has no relevant family history of coronary artery disease (first degree relative at less than age 88) and has no history of hypercholesterolemia.   Past Medical History:  Diagnosis Date  . Abortion history   . Anemia   . Asthma   . Fibroid tumor   . Hypertension   . Migraine headache   . Shingles      Patient Active Problem List   Diagnosis Date Noted  . Pleurisy 06/16/2016  . Chest pain 06/15/2016  . Paresthesia 03/18/2015  . Acute left-sided weakness 05/13/2013  . UNSPECIFIED VITAMIN D DEFICIENCY 06/06/2008  . VIRAL INFECTION 08/31/2007  . ALLERGIC RHINITIS 08/31/2007  . Asthma 08/31/2007    Past Surgical History:  Procedure Laterality Date  . CESAREAN SECTION    . COLONOSCOPY, ESOPHAGOGASTRODUODENOSCOPY (EGD) AND ESOPHAGEAL DILATION    . CYSTECTOMY     breast     OB History   No obstetric history on file.     Family History  Problem Relation Age of Onset  . Hypertension Father        Living, 46  . High Cholesterol Father   . Hypertension Mother   . Non-Hodgkin's lymphoma Mother        Deceased, 41  . Cancer Maternal Grandfather   . Cancer Paternal Grandfather   . Heart disease Maternal Uncle   . Heart disease Paternal Uncle   . Healthy Brother   . Asthma Son   . Asthma Daughter     Social History   Tobacco Use  . Smoking status: Never Smoker  . Smokeless tobacco: Never Used  Substance Use Topics  . Alcohol use: No    Alcohol/week: 0.0 standard drinks  . Drug use: No    Home Medications Prior to Admission medications   Medication Sig Start Date End Date Taking? Authorizing Provider  acetaminophen (TYLENOL) 500 MG tablet Take 1,000 mg  by mouth every 6 (six) hours as needed for mild pain.   Yes [provider]  ibuprofen (ADVIL,MOTRIN) 200 MG tablet Take 400 mg by mouth every 6 (six) hours as needed for moderate pain.    Yes [provider]  albuterol (PROVENTIL HFA;VENTOLIN HFA) 108 (90 BASE) MCG/ACT inhaler Inhale 2 puffs into the lungs every 6 (six) hours as needed for wheezing or shortness of breath. For shortness of breath    [provider]  omeprazole (PRILOSEC) 20 MG capsule Take 1 capsule (20 mg total) by mouth daily. 06/11/18 08/02/19  Kirichenko, Lahoma Rocker, PA-C  sucralfate (CARAFATE) 1 g tablet Take 1 tablet (1  g total) by mouth 4 (four) times daily -  with meals and at bedtime. 06/11/18 08/02/19  Kirichenko, Lahoma Rocker, PA-C    Allergies    Macrolides and ketolides, Penicillins, Shellfish allergy, Advair diskus [fluticasone-salmeterol], Erythromycin, Lisinopril, Nyamyc [nystatin], Almond (diagnostic), Diphenhydramine hcl (sleep), Neomycin, Asa [aspirin], and Latex  Review of Systems   Review of Systems  Constitutional: Positive for fatigue. Negative for fever.  Respiratory: Positive for cough and shortness of breath.   Cardiovascular: Positive for chest pain and palpitations.  Gastrointestinal: Positive for abdominal pain and diarrhea. Negative for blood in stool.  All other systems reviewed and are negative.   Physical Exam Updated Vital Signs BP (!) 163/95   Pulse 83   Temp 97.9 F (36.6 C) (Oral)   Resp (!) 30   Ht 1.549 m (5\' 1" )   Wt 90.7 kg   LMP 01/13/2020   SpO2 98%   BMI 37.79 kg/m   Physical Exam CONSTITUTIONAL: Well developed/well nourished HEAD: Normocephalic/atraumatic EYES: EOMI/PERRL ENMT: Mucous membranes moist NECK: supple no meningeal signs SPINE/BACK:entire spine nontender CV: S1/S2 noted, no murmurs/rubs/gallops noted LUNGS: Lungs are clear to auscultation bilaterally, no apparent distress ABDOMEN: soft, nontender, no rebound or guarding, bowel sounds noted throughout abdomen GU:no cva tenderness NEURO: Pt is awake/alert/appropriate, moves all extremitiesx4.  No facial droop.  EXTREMITIES: pulses normal/equal, full ROM, no calf tenderness or edema SKIN: warm, color normal PSYCH: no abnormalities of mood noted, alert and oriented to situation  ED Results / Procedures / Treatments   Labs (all labs ordered are listed, but only abnormal results are displayed) Labs Reviewed  BASIC METABOLIC PANEL - Abnormal; Notable for the following components:      Result Value   Glucose, Bld 132 (*)    All other components within normal limits  CBC  I-STAT BETA HCG BLOOD,  ED ([facility], [facility], [facility] ONLY)  TROPONIN I (HIGH SENSITIVITY)  TROPONIN I (HIGH SENSITIVITY)    EKG EKG Interpretation  Date/Time:  Monday January 27 2020 05:41:11 EDT Ventricular Rate:  102 PR Interval:  154 QRS Duration: 75 QT Interval:  325 QTC Calculation: 424 R Axis:   59 Text Interpretation: Sinus tachycardia Abnormal R-wave progression, early transition Nonspecific T abnormalities, diffuse leads Confirmed by Ripley Fraise [phone]) on 01/27/2020 6:20:20 AM   Radiology DG Chest 2 View  Result Date: 01/27/2020 CLINICAL DATA:  Shortness of breath and chest tightness. EXAM: CHEST - 2 VIEW COMPARISON:  Feb 28, 2019 FINDINGS: There is no evidence of acute infiltrate, pleural effusion or pneumothorax. A stable 5 mm calcified nodular opacity is seen overlying the lateral aspect of the right lung base. The heart size and mediastinal contours are within normal limits. The visualized skeletal structures are unremarkable. IMPRESSION: No active cardiopulmonary disease. Electronically Signed   By: Virgina Norfolk M.D.   On: 01/27/2020 03:55  Procedures .1-3 Lead EKG Interpretation Performed by: Ripley Fraise, MD Authorized by: Ripley Fraise, MD     Interpretation: normal     ECG rate:  83   ECG rate assessment: normal     Rhythm: sinus rhythm     Ectopy: none     Conduction: normal     Medications Ordered in ED Medications  sodium chloride flush (NS) 0.9 % injection 3 mL (has no administration in time range)  albuterol (VENTOLIN HFA) 108 (90 Base) MCG/ACT inhaler 2 puff (2 puffs Inhalation Given 01/27/20 [id number])    ED Course  I have reviewed the triage vital signs and the nursing notes.  Pertinent labs & imaging results that were available during my care of the patient were reviewed by me and considered in my medical decision making (see chart for details).       This patient presents to the ED for concern of palpitations, chest pain, shortness of breath, this involves an  extensive number of treatment options, and is a complaint that carries with it a high risk of complications and morbidity.  The differential diagnosis includes dysrhythmia, heart block, ACS, PE   Lab Tests:   I Ordered, reviewed, and interpreted labs, which included troponin, electrolytes, CBC    Imaging Studies ordered:   I ordered imaging studies which included chest x-ray   I independently visualized and interpreted imaging which showed no acute findings  Additional history obtained:    Previous records obtained and reviewed     Reevaluation:  After the interventions stated above, I reevaluated the patient and found stable/improved  .   MDM Rules/Calculators/A&P HEAR Score: 2                    Patient presented with multiple complaints.  She reports since her Covid vaccine she was having abdominal discomfort and diarrhea.  During the night she began having a warm sensation in her chest as well as palpitations and short of breath.  No fevers or vomiting but she did have a cough. Patient ambulated without difficulty.  No hypoxia.  No significant tachycardia.  She did have nonspecific T wave changes on EKG but no other acute findings Suspect some of her symptoms may be due to the COVID-19 vaccine Patient feels comfortable for discharge home. Final Clinical Impression(s) / ED Diagnoses Final diagnoses:  Shortness of breath  Diarrhea, unspecified type  Palpitations    Rx / DC Orders ED Discharge Orders    None       Ripley Fraise, MD 01/27/20 [phone]

## 2020-01-27 NOTE — ED Notes (Signed)
Pt Spo2 remained above 96% on RA while ambulating

## 2020-01-27 NOTE — ED Triage Notes (Signed)
Patient reports central chest tightness with SOB and dry cough onset this evening , no emesis or diaphoresis , denies fever or chills .

## 2020-02-10 DIAGNOSIS — J4530 Mild persistent asthma, uncomplicated: Secondary | ICD-10-CM | POA: Diagnosis not present

## 2020-02-10 DIAGNOSIS — I10 Essential (primary) hypertension: Secondary | ICD-10-CM | POA: Diagnosis not present

## 2020-02-10 DIAGNOSIS — E663 Overweight: Secondary | ICD-10-CM | POA: Diagnosis not present

## 2020-02-22 ENCOUNTER — Ambulatory Visit: Payer: 59

## 2020-04-28 DIAGNOSIS — Z0000 Encounter for general adult medical examination without abnormal findings: Secondary | ICD-10-CM | POA: Diagnosis not present

## 2020-04-28 DIAGNOSIS — I10 Essential (primary) hypertension: Secondary | ICD-10-CM | POA: Diagnosis not present

## 2020-04-28 DIAGNOSIS — Z13228 Encounter for screening for other metabolic disorders: Secondary | ICD-10-CM | POA: Diagnosis not present

## 2020-04-28 DIAGNOSIS — Z13220 Encounter for screening for lipoid disorders: Secondary | ICD-10-CM | POA: Diagnosis not present

## 2020-05-01 DIAGNOSIS — I10 Essential (primary) hypertension: Secondary | ICD-10-CM | POA: Diagnosis not present

## 2020-05-01 DIAGNOSIS — E663 Overweight: Secondary | ICD-10-CM | POA: Diagnosis not present

## 2020-05-06 ENCOUNTER — Emergency Department (HOSPITAL_COMMUNITY)
Admission: EM | Admit: 2020-05-06 | Discharge: 2020-05-07 | Disposition: A | Discharge status: Home / Self Care | Payer: No Typology Code available for payment source | Attending: Emergency Medicine | Admitting: Emergency Medicine

## 2020-05-06 ENCOUNTER — Other Ambulatory Visit: Payer: Self-pay

## 2020-05-06 ENCOUNTER — Emergency Department (HOSPITAL_COMMUNITY): Payer: No Typology Code available for payment source

## 2020-05-06 ENCOUNTER — Encounter (HOSPITAL_COMMUNITY): Payer: Self-pay | Admitting: Emergency Medicine

## 2020-05-06 DIAGNOSIS — R519 Headache, unspecified: Secondary | ICD-10-CM | POA: Insufficient documentation

## 2020-05-06 DIAGNOSIS — R0789 Other chest pain: Secondary | ICD-10-CM | POA: Diagnosis not present

## 2020-05-06 DIAGNOSIS — R079 Chest pain, unspecified: Secondary | ICD-10-CM | POA: Insufficient documentation

## 2020-05-06 DIAGNOSIS — I10 Essential (primary) hypertension: Secondary | ICD-10-CM | POA: Diagnosis not present

## 2020-05-06 DIAGNOSIS — M542 Cervicalgia: Secondary | ICD-10-CM | POA: Insufficient documentation

## 2020-05-06 DIAGNOSIS — Z91040 Latex allergy status: Secondary | ICD-10-CM | POA: Insufficient documentation

## 2020-05-06 DIAGNOSIS — Z79899 Other long term (current) drug therapy: Secondary | ICD-10-CM | POA: Insufficient documentation

## 2020-05-06 LAB — BASIC METABOLIC PANEL
Anion gap: 7 (ref 5–15)
BUN: 17 mg/dL (ref 6–20)
CO2: 27 mmol/L (ref 22–32)
Calcium: 9.8 mg/dL (ref 8.9–10.3)
Chloride: 104 mmol/L (ref 98–111)
Creatinine, Ser: 1.05 mg/dL — ABNORMAL HIGH (ref 0.44–1.00)
GFR calc Af Amer: >60 mL/min (ref >60)
GFR calc non Af Amer: >60 mL/min (ref >60)
Glucose, Bld: 90 mg/dL (ref 70–99)
Potassium: 3.9 mmol/L (ref 3.5–5.1)
Sodium: 138 mmol/L (ref 135–145)

## 2020-05-06 LAB — CBC
HCT: 37.1 % (ref 36.0–46.0)
Hemoglobin: 11.7 g/dL — ABNORMAL LOW (ref 12.0–15.0)
MCH: 27.7 pg (ref 26.0–34.0)
MCHC: 31.5 g/dL (ref 30.0–36.0)
MCV: 87.9 fL (ref 80.0–100.0)
Platelets: 316 10*3/uL (ref 150–400)
RBC: 4.22 MIL/uL (ref 3.87–5.11)
RDW: 14.2 % (ref 11.5–15.5)
WBC: 7.4 10*3/uL (ref 4.0–10.5)
nRBC: 0 % (ref 0.0–0.2)

## 2020-05-06 LAB — TROPONIN I (HIGH SENSITIVITY)
Troponin I (High Sensitivity): 4 ng/L (ref <18)
Troponin I (High Sensitivity): 4 ng/L (ref <18)

## 2020-05-06 LAB — I-STAT BETA HCG BLOOD, ED (MC, WL, AP ONLY): I-stat hCG, quantitative: <5 m[IU]/mL (ref <5)

## 2020-05-06 MED ORDER — SODIUM CHLORIDE 0.9% FLUSH: 3.0000 mL | Freq: Once | INTRAVENOUS | Status: DC

## 2020-05-06 NOTE — ED Notes (Signed)
Pt called for vtals x3. No answer

## 2020-05-06 NOTE — ED Triage Notes (Signed)
Patient arrives to ED with complaints of right sided chest pain, right sided neck pain, and right arm pain that started today. Patient states she started metoprolol today for the first time and those symptoms started after.

## 2020-05-07 LAB — TROPONIN I (HIGH SENSITIVITY): Troponin I (High Sensitivity): 4 ng/L (ref <18)

## 2020-05-07 MED ORDER — HYDRALAZINE HCL 10 MG PO TABS
10.0000 mg | ORAL_TABLET | Freq: Once | ORAL | Status: AC
  Administered 2020-05-07: 10 mg via ORAL
  Filled 2020-05-07: qty 1

## 2020-05-07 MED ORDER — DIPHENHYDRAMINE HCL 50 MG/ML IJ SOLN
12.5000 mg | Freq: Once | INTRAMUSCULAR | Status: DC
  Filled 2020-05-07: qty 1

## 2020-05-07 MED ORDER — HYDROCHLOROTHIAZIDE 25 MG PO TABS
25.0000 mg | ORAL_TABLET | Freq: Once | ORAL | Status: AC
  Administered 2020-05-07: 25 mg via ORAL
  Filled 2020-05-07: qty 1

## 2020-05-07 MED ORDER — PROCHLORPERAZINE EDISYLATE 10 MG/2ML IJ SOLN
10.0000 mg | Freq: Once | INTRAMUSCULAR | Status: DC
  Filled 2020-05-07: qty 2

## 2020-05-07 MED ORDER — SODIUM CHLORIDE 0.9 % IV BOLUS
1000.0000 mL | Freq: Once | INTRAVENOUS | Status: AC
  Administered 2020-05-07: 1000 mL via INTRAVENOUS

## 2020-05-07 MED ORDER — HYDROCHLOROTHIAZIDE 25 MG PO TABS
25.0000 mg | ORAL_TABLET | Freq: Every day | ORAL | 0 refills | Status: DC
Start: 2020-05-07 — End: 2020-08-28

## 2020-05-07 NOTE — ED Provider Notes (Signed)
[place] EMERGENCY DEPARTMENT Provider Note   CSN: [id number] Arrival date & time: 05/06/20  1500     History Chief Complaint  Patient presents with  . Chest Pain    Audrey [name] is a 44 y.o. female.  HPI      44yo female with history of hypertension presents with chest pain. BegaN metoprolol yesterday. Took it and neck up felt warm and pressure, went away and the arm and neck on right side began hurting then chest began hurting and came in.  As she was waiting left neck pain started. BP was 193/113 last night. Not sure if should keep taking it or take the clonidine.  Was rx metoprolol for blood pressures. Bp have been high since last Thursday. Thursday to yesterday was taking clonidine BID but felt very sleepy and lightheaded.  So yesterday, tried metoprolol instead of clonidine.  Initially chest pain felt like a sharp quick pain then went away, while here feels like hot burning feeling o pressure and pain with radiation to the head on the left side, 8/10. Associated metallic taste.  Closes eyes with headache. Hx of migraines many years ago but does not feel the same.   Past Medical History:  Diagnosis Date  . Abortion history   . Anemia   . Asthma   . Fibroid tumor   . Hypertension   . Migraine headache   . Shingles     Patient Active Problem List   Diagnosis Date Noted  . Pleurisy 06/16/2016  . Chest pain 06/15/2016  . Paresthesia 03/18/2015  . Acute left-sided weakness 05/13/2013  . UNSPECIFIED VITAMIN D DEFICIENCY 06/06/2008  . VIRAL INFECTION 08/31/2007  . ALLERGIC RHINITIS 08/31/2007  . Asthma 08/31/2007    Past Surgical History:  Procedure Laterality Date  . CESAREAN SECTION    . COLONOSCOPY, ESOPHAGOGASTRODUODENOSCOPY (EGD) AND ESOPHAGEAL DILATION    . CYSTECTOMY     breast     OB History   No obstetric history on file.     Family History  Problem Relation Age of Onset  . Hypertension Father        Living, 85  . High  Cholesterol Father   . Hypertension Mother   . Non-Hodgkin's lymphoma Mother        Deceased, 40  . Cancer Maternal Grandfather   . Cancer Paternal Grandfather   . Heart disease Maternal Uncle   . Heart disease Paternal Uncle   . Healthy Brother   . Asthma Son   . Asthma Daughter     Social History   Tobacco Use  . Smoking status: Never Smoker  . Smokeless tobacco: Never Used  Substance Use Topics  . Alcohol use: No    Alcohol/week: 0.0 standard drinks  . Drug use: No    Home Medications Prior to Admission medications   Medication Sig Start Date End Date Taking? Authorizing Provider  acetaminophen (TYLENOL) 500 MG tablet Take 1,000 mg by mouth every 6 (six) hours as needed for mild pain.   Yes [provider]  albuterol (PROVENTIL HFA;VENTOLIN HFA) 108 (90 BASE) MCG/ACT inhaler Inhale 2 puffs into the lungs every 6 (six) hours as needed for wheezing or shortness of breath. For shortness of breath   Yes [provider]  clonazePAM (KLONOPIN) 0.5 MG tablet Take 0.5 mg by mouth 3 (three) times daily as needed for anxiety. 04/28/20  Yes [provider]  cloNIDine (CATAPRES) 0.2 MG tablet Take 0.2 mg by mouth daily.  04/30/20  Yes [provider]  ibuprofen (ADVIL,MOTRIN) 200 MG tablet Take 400 mg by mouth every 6 (six) hours as needed for moderate pain.    Yes [provider]  metoprolol tartrate (LOPRESSOR) 50 MG tablet Take 50 mg by mouth 2 (two) times daily. 05/04/20  Yes [provider]  hydrochlorothiazide (HYDRODIURIL) 25 MG tablet Take 1 tablet (25 mg total) by mouth daily. 05/07/20   Gareth Morgan, MD  SYMBICORT 80-4.5 MCG/ACT inhaler Inhale 2 puffs into the lungs in the morning and at bedtime.  03/07/20   [provider]  omeprazole (PRILOSEC) 20 MG capsule Take 1 capsule (20 mg total) by mouth daily. 06/11/18 08/02/19  Kirichenko, Lahoma Rocker, PA-C  sucralfate (CARAFATE) 1 g tablet Take 1 tablet (1 g total) by mouth 4  (four) times daily -  with meals and at bedtime. 06/11/18 08/02/19  Kirichenko, Lahoma Rocker, PA-C    Allergies    Macrolides and ketolides, Penicillins, Shellfish allergy, Advair diskus [fluticasone-salmeterol], Erythromycin, Lisinopril, Nyamyc [nystatin], Almond (diagnostic), Diphenhydramine hcl (sleep), Neomycin, Asa [aspirin], and Latex  Review of Systems   Review of Systems  Constitutional: Negative for fever.  HENT: Negative for sore throat.   Eyes: Negative for visual disturbance.  Respiratory: Negative for cough and shortness of breath.   Cardiovascular: Positive for chest pain.  Gastrointestinal: Negative for abdominal pain, nausea and vomiting.  Genitourinary: Negative for difficulty urinating.  Musculoskeletal: Positive for neck pain. Negative for back pain.  Skin: Negative for rash.  Neurological: Positive for headaches. Negative for syncope.    Physical Exam Updated Vital Signs BP (!) 158/91   Pulse 69   Temp 98 F (36.7 C) (Oral)   Resp 21   Ht 5\' 1"  (1.549 m)   Wt 91.6 kg   LMP 04/29/2020 (Approximate)   SpO2 100%   BMI 38.17 kg/m   Physical Exam Vitals and nursing note reviewed.  Constitutional:      General: She is not in acute distress.    Appearance: She is well-developed. She is not diaphoretic.  HENT:     Head: Normocephalic and atraumatic.  Eyes:     General: No visual field deficit.    Conjunctiva/sclera: Conjunctivae normal.  Cardiovascular:     Rate and Rhythm: Normal rate and regular rhythm.     Heart sounds: Normal heart sounds. No murmur heard.  No friction rub. No gallop.   Pulmonary:     Effort: Pulmonary effort is normal. No respiratory distress.     Breath sounds: Normal breath sounds. No wheezing or rales.  Abdominal:     General: There is no distension.     Palpations: Abdomen is soft.     Tenderness: There is no abdominal tenderness. There is no guarding.  Musculoskeletal:        General: No tenderness.     Cervical back: Normal  range of motion.  Skin:    General: Skin is warm and dry.     Findings: No erythema or rash.  Neurological:     Mental Status: She is alert and oriented to person, place, and time.     Cranial Nerves: No cranial nerve deficit, dysarthria or facial asymmetry.     Motor: Motor function is intact.     Comments: Reports warmth to palpation on the left side     ED Results / Procedures / Treatments   Labs (all labs ordered are listed, but only abnormal results are displayed) Labs Reviewed  BASIC METABOLIC PANEL - Abnormal;  Notable for the following components:      Result Value   Creatinine, Ser 1.05 (*)    All other components within normal limits  CBC - Abnormal; Notable for the following components:   Hemoglobin 11.7 (*)    All other components within normal limits  I-STAT BETA HCG BLOOD, ED ([facility], [facility], [facility] ONLY)  TROPONIN I (HIGH SENSITIVITY)  TROPONIN I (HIGH SENSITIVITY)  TROPONIN I (HIGH SENSITIVITY)  TROPONIN I (HIGH SENSITIVITY)    EKG EKG Interpretation  Date/Time:  Wednesday May 06 2020 15:52:54 EDT Ventricular Rate:  66 PR Interval:  150 QRS Duration: 68 QT Interval:  382 QTC Calculation: 400 R Axis:   53 Text Interpretation: Normal sinus rhythm Normal ECG rate slower than previous Confirmed by Theotis Burrow [id number]) on 05/07/2020 1:55:40 PM   Radiology DG Chest 2 View  Result Date: 05/06/2020 CLINICAL DATA:  Chest pain, LEFT side chest tightness, RIGHT-side neck and arm pain; onset of symptoms today after starting metoprolol for hypertension EXAM: CHEST - 2 VIEW COMPARISON:  01/28/2020 FINDINGS: Upper normal heart size. Mediastinal contours and pulmonary vascularity normal. Calcified granuloma at RIGHT base. Lungs otherwise clear. No pulmonary infiltrate, pleural effusion or pneumothorax. Osseous structures unremarkable. IMPRESSION: No acute abnormalities. Electronically Signed   By: Lavonia Dana M.D.   On: 05/06/2020 16:46    Procedures Procedures (including  critical care time)  Medications Ordered in ED Medications  hydrALAZINE (APRESOLINE) tablet 10 mg (10 mg Oral Given 05/07/20 1237)  hydrochlorothiazide (HYDRODIURIL) tablet 25 mg (25 mg Oral Given 05/07/20 1237)  sodium chloride 0.9 % bolus 1,000 mL (0 mLs Intravenous Stopped 05/07/20 1409)    ED Course  I have reviewed the triage vital signs and the nursing notes.  Pertinent labs & imaging results that were available during my care of the patient were reviewed by me and considered in my medical decision making (see chart for details).    MDM Rules/Calculators/A&P                          44yo female with history of hypertension presents with concern for neck pain and chest pain described as a sensation of warmth.   Differential diagnosis for chest pain includes pulmonary embolus, dissection, pneumothorax, pneumonia, ACS, myocarditis, pericarditis.  EKG was done and evaluate by me and showed no acute ST changes and no signs of pericarditis. Chest x-ray was done and evaluated by me and radiology and showed no sign of pneumonia or pneumothorax. Patient is PERC negative and low risk [place] and have low suspicion for PE.   Do not feel history, XR, or exam are consistent with aortic dissection.   Troponin checked x3 without increase. No signs of ACS and chest pain atypical.  Neurologic exam normal with exception of sensation of "warmth" to left extremities.  Do not see signs of CVA. Discussed with headache and positive symptoms such as warmth consider migraine.  Duration of symptoms not consistent with focal seizures and changing areas of symptoms also less consistent with this.   Do not see signs of hypertensive emergency.  Electrolytes WNL. Mild anemia present.  Blood pressures elevated in ED.  Symptoms do not appear consistent with allergic reaction from metoprolol but do feel there are other antihypertensives that may be more effective. Given IV fluid, compazine for headache and hctz/hydralazine with  improvement in the ED. Recommend follow up with PCP, given rx for hctz. Patient discharged in stable condition with understanding of  reasons to return.      Final Clinical Impression(s) / ED Diagnoses Final diagnoses:  Chest pain, unspecified type  Essential hypertension    Rx / DC Orders ED Discharge Orders         Ordered    hydrochlorothiazide (HYDRODIURIL) 25 MG tablet  Daily     Discontinue  Reprint     05/07/20 1352           Gareth Morgan, MD 05/07/20 2322

## 2020-05-07 NOTE — ED Notes (Signed)
Dr. Schlossman at bedside. 

## 2020-05-07 NOTE — ED Notes (Signed)
Pt. Assisted to the bathroom. Ambulated with a steady gait no c/o Sob or dizziness.

## 2020-05-10 DIAGNOSIS — I10 Essential (primary) hypertension: Secondary | ICD-10-CM | POA: Diagnosis not present

## 2020-05-19 DIAGNOSIS — J4530 Mild persistent asthma, uncomplicated: Secondary | ICD-10-CM | POA: Diagnosis not present

## 2020-05-19 DIAGNOSIS — R519 Headache, unspecified: Secondary | ICD-10-CM | POA: Diagnosis not present

## 2020-05-19 DIAGNOSIS — E663 Overweight: Secondary | ICD-10-CM | POA: Diagnosis not present

## 2020-05-19 DIAGNOSIS — R5383 Other fatigue: Secondary | ICD-10-CM | POA: Diagnosis not present

## 2020-05-19 DIAGNOSIS — I10 Essential (primary) hypertension: Secondary | ICD-10-CM | POA: Diagnosis not present

## 2020-05-25 ENCOUNTER — Other Ambulatory Visit: Payer: Self-pay | Admitting: Family Medicine

## 2020-05-25 DIAGNOSIS — R42 Dizziness and giddiness: Secondary | ICD-10-CM

## 2020-05-25 DIAGNOSIS — I10 Essential (primary) hypertension: Secondary | ICD-10-CM

## 2020-05-26 DIAGNOSIS — J4530 Mild persistent asthma, uncomplicated: Secondary | ICD-10-CM | POA: Diagnosis not present

## 2020-05-26 DIAGNOSIS — I10 Essential (primary) hypertension: Secondary | ICD-10-CM | POA: Diagnosis not present

## 2020-05-26 DIAGNOSIS — E663 Overweight: Secondary | ICD-10-CM | POA: Diagnosis not present

## 2020-05-26 DIAGNOSIS — K582 Mixed irritable bowel syndrome: Secondary | ICD-10-CM | POA: Diagnosis not present

## 2020-05-29 ENCOUNTER — Ambulatory Visit
Admission: RE | Admit: 2020-05-29 | Discharge: 2020-05-29 | Disposition: A | Discharge status: Home / Self Care | Payer: No Typology Code available for payment source | Source: Ambulatory Visit | Attending: Family Medicine | Admitting: Family Medicine

## 2020-05-29 DIAGNOSIS — R42 Dizziness and giddiness: Secondary | ICD-10-CM

## 2020-05-29 DIAGNOSIS — I6523 Occlusion and stenosis of bilateral carotid arteries: Secondary | ICD-10-CM | POA: Diagnosis not present

## 2020-05-29 DIAGNOSIS — E041 Nontoxic single thyroid nodule: Secondary | ICD-10-CM | POA: Diagnosis not present

## 2020-06-02 ENCOUNTER — Other Ambulatory Visit: Payer: No Typology Code available for payment source

## 2020-06-08 ENCOUNTER — Ambulatory Visit
Admission: RE | Admit: 2020-06-08 | Discharge: 2020-06-08 | Disposition: A | Discharge status: Home / Self Care | Payer: No Typology Code available for payment source | Source: Ambulatory Visit | Attending: Family Medicine | Admitting: Family Medicine

## 2020-06-08 DIAGNOSIS — I10 Essential (primary) hypertension: Secondary | ICD-10-CM

## 2020-06-09 ENCOUNTER — Other Ambulatory Visit: Payer: Self-pay | Admitting: Family Medicine

## 2020-06-09 DIAGNOSIS — E663 Overweight: Secondary | ICD-10-CM | POA: Diagnosis not present

## 2020-06-09 DIAGNOSIS — E041 Nontoxic single thyroid nodule: Secondary | ICD-10-CM

## 2020-06-09 DIAGNOSIS — I10 Essential (primary) hypertension: Secondary | ICD-10-CM | POA: Diagnosis not present

## 2020-06-09 DIAGNOSIS — J4530 Mild persistent asthma, uncomplicated: Secondary | ICD-10-CM | POA: Diagnosis not present

## 2020-06-15 ENCOUNTER — Other Ambulatory Visit: Payer: No Typology Code available for payment source

## 2020-06-19 ENCOUNTER — Ambulatory Visit
Admission: RE | Admit: 2020-06-19 | Discharge: 2020-06-19 | Disposition: A | Discharge status: Home / Self Care | Payer: No Typology Code available for payment source | Source: Ambulatory Visit | Attending: Family Medicine | Admitting: Family Medicine

## 2020-06-19 DIAGNOSIS — E041 Nontoxic single thyroid nodule: Secondary | ICD-10-CM

## 2020-07-16 DIAGNOSIS — R21 Rash and other nonspecific skin eruption: Secondary | ICD-10-CM | POA: Diagnosis not present

## 2020-07-16 DIAGNOSIS — E663 Overweight: Secondary | ICD-10-CM | POA: Diagnosis not present

## 2020-07-16 DIAGNOSIS — J4530 Mild persistent asthma, uncomplicated: Secondary | ICD-10-CM | POA: Diagnosis not present

## 2020-07-16 DIAGNOSIS — I10 Essential (primary) hypertension: Secondary | ICD-10-CM | POA: Diagnosis not present

## 2020-07-31 DIAGNOSIS — J4530 Mild persistent asthma, uncomplicated: Secondary | ICD-10-CM | POA: Diagnosis not present

## 2020-07-31 DIAGNOSIS — R438 Other disturbances of smell and taste: Secondary | ICD-10-CM | POA: Diagnosis not present

## 2020-07-31 DIAGNOSIS — R0982 Postnasal drip: Secondary | ICD-10-CM | POA: Diagnosis not present

## 2020-07-31 DIAGNOSIS — K582 Mixed irritable bowel syndrome: Secondary | ICD-10-CM | POA: Diagnosis not present

## 2020-07-31 DIAGNOSIS — I10 Essential (primary) hypertension: Secondary | ICD-10-CM | POA: Diagnosis not present

## 2020-08-01 DIAGNOSIS — Z20822 Contact with and (suspected) exposure to covid-19: Secondary | ICD-10-CM | POA: Diagnosis not present

## 2020-08-28 ENCOUNTER — Other Ambulatory Visit: Payer: Self-pay

## 2020-08-28 ENCOUNTER — Ambulatory Visit (INDEPENDENT_AMBULATORY_CARE_PROVIDER_SITE_OTHER): Payer: No Typology Code available for payment source | Admitting: Internal Medicine

## 2020-08-28 ENCOUNTER — Encounter: Payer: Self-pay | Admitting: Internal Medicine

## 2020-08-28 VITALS — BP 160/82 | HR 72 | Ht 61.0 in | Wt 203.0 lb

## 2020-08-28 DIAGNOSIS — E042 Nontoxic multinodular goiter: Secondary | ICD-10-CM | POA: Diagnosis not present

## 2020-08-28 HISTORY — DX: Nontoxic multinodular goiter: E04.2

## 2020-08-28 LAB — TSH: TSH: 1.37 u[IU]/mL (ref 0.35–4.50)

## 2020-08-28 LAB — T4, FREE: Free T4: 0.92 ng/dL (ref 0.60–1.60)

## 2020-08-28 NOTE — Patient Instructions (Signed)
If you decide to proceed with thyroid surgery, its called " total thyroidectomy " please look up information on [facility] website

## 2020-08-28 NOTE — Progress Notes (Signed)
Name: Audrey [name]  MRN/ DOB: [id number], September 28, 1976    Age/ Sex: 44 y.o., female    PCP: Leilani Able, MD   Reason for Endocrinology Evaluation: MNG     Date of Initial Endocrinology Evaluation: 08/28/2020     HPI: Audrey [name] is a 44 y.o. female with a past medical history of Asthma. The patient presented for initial endocrinology clinic visit on 08/28/2020 for consultative assistance with her MNG.    She has been diagnosed with thyroid nodules in 2019 , this was noted again during carotid ultrasound.which prompted and thyriod ultrasound revealing multiple thyroid nodules but none meets criteria for a biopsy.    She is having dysphagia as well as back neck pain as well as hoarseness.  She has occasional GERD   Denies palpitations or diarrhea  Has noted weight loss ( dietary changes)  Has occasional tremors sometimes affects her whole body .    Of note the pt was noted to have elevated BP during her visit. She has been rushing today, did not take medicine until during her visit.   Endoscopy 2019 showing acid reflux , benign plyps, stomach irritation  HISTORY:  Past Medical History:  Past Medical History:  Diagnosis Date  . Abortion history   . Anemia   . Asthma   . Fibroid tumor   . Hypertension   . Migraine headache   . Shingles    Past Surgical History:  Past Surgical History:  Procedure Laterality Date  . CESAREAN SECTION    . COLONOSCOPY, ESOPHAGOGASTRODUODENOSCOPY (EGD) AND ESOPHAGEAL DILATION    . CYSTECTOMY     breast    Social History:  reports that she has never smoked. She has never used smokeless tobacco. She reports that she does not drink alcohol and does not use drugs. Family History: family history includes Asthma in her daughter and son; Cancer in her maternal grandfather and paternal grandfather; Healthy in her brother; Heart disease in her maternal uncle and paternal uncle; High Cholesterol in her father; Hypertension in her father and  mother; Non-Hodgkin's lymphoma in her mother.   HOME MEDICATIONS: Allergies as of 08/28/2020      Reactions   Macrolides And Ketolides Other (See Comments)   Made eye swelling worse   Penicillins Other (See Comments), Itching, Rash   Eye swelling   Shellfish Allergy Anaphylaxis   Advair Diskus [fluticasone-salmeterol] Nausea Only   Weakness headaches   Erythromycin Other (See Comments)   Made eye infection worse   Lisinopril Other (See Comments), Swelling   Caused lips to swell   Nyamyc [nystatin] Other (See Comments)   Made eye infection worse   Almond (diagnostic) Other (See Comments)   Stomach pain   Diphenhydramine Hcl (sleep) Other (See Comments)   Neomycin Swelling   Asa [aspirin] Nausea Only   Latex Rash      Medication List       Accurate as of August 28, 2020 12:26 PM. If you have any questions, ask your nurse or doctor.        STOP taking these medications   hydrochlorothiazide 25 MG tablet Commonly known as: HYDRODIURIL Stopped by: Scarlette Shorts, MD   metoprolol tartrate 50 MG tablet Commonly known as: LOPRESSOR Stopped by: Scarlette Shorts, MD     TAKE these medications   acetaminophen 500 MG tablet Commonly known as: TYLENOL Take 1,000 mg by mouth every 6 (six) hours as needed for mild pain.   albuterol 108 (  90 Base) MCG/ACT inhaler Commonly known as: VENTOLIN HFA Inhale 2 puffs into the lungs every 6 (six) hours as needed for wheezing or shortness of breath. For shortness of breath   clonazePAM 0.5 MG tablet Commonly known as: KLONOPIN Take 0.5 mg by mouth 3 (three) times daily as needed for anxiety.   cloNIDine 0.2 MG tablet Commonly known as: CATAPRES Take 0.2 mg by mouth daily.   ibuprofen 200 MG tablet Commonly known as: ADVIL Take 400 mg by mouth every 6 (six) hours as needed for moderate pain.   Symbicort 80-4.5 MCG/ACT inhaler Generic drug: budesonide-formoterol Inhale 2 puffs into the lungs in the morning and at  bedtime.         REVIEW OF SYSTEMS: A comprehensive ROS was conducted with the patient and is negative except as per HPI    OBJECTIVE:  VS: BP (!) 160/82   Pulse 72   Ht 5\' 1"  (1.549 m)   Wt 203 lb (92.1 kg)   SpO2 97%   BMI 38.36 kg/m    Wt Readings from Last 3 Encounters:  08/28/20 203 lb (92.1 kg)  05/06/20 202 lb (91.6 kg)  01/27/20 200 lb (90.7 kg)     EXAM: General: Pt appears well and is in NAD  Neck: General: Supple without adenopathy. Thyroid: Thyroid size normal.  Right nodule appreciated.  Lungs: Clear with good BS bilat with no rales, rhonchi, or wheezes  Heart: Auscultation: RRR.  Abdomen: Normoactive bowel sounds, soft, nontender, without masses or organomegaly palpable  Extremities:  BL LE: No pretibial edema normal ROM and strength.  Skin: Hair: Texture and amount normal with gender appropriate distribution Skin Inspection: No rashes Skin Palpation: Skin temperature, texture, and thickness normal to palpation  Neuro:l Cranial nerves: II - XII grossly intact  Motor: Normal strength throughout DTRs: 2+ and symmetric in UE without delay in relaxation phase  Mental Status: Judgment, insight: Intact Orientation: Oriented to time, place, and person Mood and affect: No depression, anxiety, or agitation     DATA REVIEWED:  Results for Audrey [name], Audrey [name] (MRN [id number]) as of 08/28/2020 12:26  Ref. Range 08/28/2020 09:15  TSH Latest Ref Range: 0.35 - 4.50 uIU/mL 1.37  T4,Free(Direct) Latest Ref Range: 0.60 - 1.60 ng/dL 2.84    Thyroid ULtrasound 06/19/2020  Estimated total number of nodules >/= 1 cm: 2  Number of spongiform nodules >/=  2 cm not described below (TR1): 0  Number of mixed cystic and solid nodules >/= 1.5 cm not described below (TR2): 0  _________________________________________________________  There is an approximately 1.1 x 0.8 x 0.4 cm anechoic cyst within the superior pole the right lobe of the thyroid (labeled 1), which does  not meet criteria to recommend percutaneous sampling or continued dedicated follow-up.  There is an approximately 1.3 x 1.1 x 0.8 cm minimally complex cyst within the inferior, posterior aspect the right lobe of the thyroid (labeled 2)., correlating with the nodule seen on preceding carotid Doppler ultrasound. This minimally complex cyst does not meet criteria to recommend percutaneous sampling or continued dedicated follow-up.  There are several scattered additional punctate (sub 4 mm) anechoic cysts and hypoechoic nodules scattered within the remainder of the right lobe of the thyroid, none of which meet imaging criteria to recommend percutaneous sampling or continued dedicated follow-up.  _________________________________________________________  There is an approximately 0.9 x 0.8 x 0.3 cm anechoic cyst within the mid aspect the left lobe of the thyroid (labeled 3), which contains an internal echogenic foci  ring down artifact compatible with benign colloid. This benign colloid containing cyst does not meet criteria to recommend percutaneous sampling or continued dedicated follow-up  There is an approximately 0.7 x 0.7 x 0.6 cm ill-defined hypoechoic nodule/pseudonodule within the inferior pole of the left lobe of the thyroid (labeled 4) which does not meet imaging criteria to recommend percutaneous sampling or continued dedicated follow-up.  There are scattered additional punctate (sub 4 mm) anechoic cysts and hypoechoic nodules with the remainder of the left lobe of the thyroid, none of which meet imaging criteria to recommend percutaneous sampling or continued dedicated follow-up.  IMPRESSION: 1. Findings suggestive of multinodular goiter. 2. None of the discretely measured thyroid nodules, including the incidentally noted minimally complex cyst on preceding carotid Doppler ultrasound, meet imaging criteria to recommend percutaneous sampling or continued dedicated  follow-up.   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 I explained to her that her nodules are small and I am not sure that all her symptoms would be resolved with total thyroidectomy as I suspect there's either a GERD component vs a cervical spine component ( pt endorses side/back neck pain and a cyst ? )  -  I have reviewed her thyroid ultrasound results and that non of the nodules are suspicious.  -I have offered her a referral to general surgery for total thyroidectomy, but I did explain to her that there is no guarantee that her symptoms are going to be completely resolved especially if there are other causes, I also explained to her that she will have to be on LT-4 replacement for the rest of her life.  -The patient is going to explore GI causes for her symptoms first, she may consider ENT follow-up -Patient will let me know if she decides to proceed with total thyroidectomy   Follow-up in 1 year Signed electronically by: Lyndle Herrlich, MD  [facility] Endocrinology  [facility] [address] [place] [place], [place] [address] Phone: [phone] FAX: [phone]   CC: Leilani Able, MD [address] [place] [address] Phone: [phone] Fax: [phone]   Return to Endocrinology clinic as below: Future Appointments  Date Time Provider Department Center  09/03/2021 10:10 AM Harmonii Karle, Konrad Dolores, MD [facility] None

## 2020-09-14 DIAGNOSIS — Z87820 Personal history of traumatic brain injury: Secondary | ICD-10-CM

## 2020-09-14 DIAGNOSIS — S060X0A Concussion without loss of consciousness, initial encounter: Secondary | ICD-10-CM

## 2020-09-14 HISTORY — DX: Concussion without loss of consciousness, initial encounter: S06.0X0A

## 2020-09-14 HISTORY — DX: Personal history of traumatic brain injury: Z87.820

## 2020-09-15 DIAGNOSIS — J312 Chronic pharyngitis: Secondary | ICD-10-CM | POA: Diagnosis not present

## 2020-09-15 DIAGNOSIS — E042 Nontoxic multinodular goiter: Secondary | ICD-10-CM | POA: Diagnosis not present

## 2020-09-15 DIAGNOSIS — M542 Cervicalgia: Secondary | ICD-10-CM | POA: Diagnosis not present

## 2020-09-15 DIAGNOSIS — I10 Essential (primary) hypertension: Secondary | ICD-10-CM | POA: Diagnosis not present

## 2020-09-15 DIAGNOSIS — J4530 Mild persistent asthma, uncomplicated: Secondary | ICD-10-CM | POA: Diagnosis not present

## 2020-09-21 ENCOUNTER — Emergency Department (HOSPITAL_BASED_OUTPATIENT_CLINIC_OR_DEPARTMENT_OTHER)
Admission: EM | Admit: 2020-09-21 | Discharge: 2020-09-21 | Disposition: A | Discharge status: Home / Self Care | Payer: No Typology Code available for payment source | Attending: Emergency Medicine | Admitting: Emergency Medicine

## 2020-09-21 ENCOUNTER — Emergency Department (HOSPITAL_BASED_OUTPATIENT_CLINIC_OR_DEPARTMENT_OTHER): Payer: No Typology Code available for payment source

## 2020-09-21 ENCOUNTER — Encounter (HOSPITAL_BASED_OUTPATIENT_CLINIC_OR_DEPARTMENT_OTHER): Payer: Self-pay

## 2020-09-21 ENCOUNTER — Other Ambulatory Visit: Payer: Self-pay

## 2020-09-21 DIAGNOSIS — M546 Pain in thoracic spine: Secondary | ICD-10-CM | POA: Diagnosis not present

## 2020-09-21 DIAGNOSIS — M542 Cervicalgia: Secondary | ICD-10-CM | POA: Diagnosis not present

## 2020-09-21 DIAGNOSIS — S299XXA Unspecified injury of thorax, initial encounter: Secondary | ICD-10-CM | POA: Diagnosis not present

## 2020-09-21 DIAGNOSIS — R519 Headache, unspecified: Secondary | ICD-10-CM | POA: Diagnosis not present

## 2020-09-21 DIAGNOSIS — Z8542 Personal history of malignant neoplasm of other parts of uterus: Secondary | ICD-10-CM | POA: Insufficient documentation

## 2020-09-21 DIAGNOSIS — I10 Essential (primary) hypertension: Secondary | ICD-10-CM | POA: Diagnosis not present

## 2020-09-21 DIAGNOSIS — M25512 Pain in left shoulder: Secondary | ICD-10-CM | POA: Insufficient documentation

## 2020-09-21 DIAGNOSIS — J45909 Unspecified asthma, uncomplicated: Secondary | ICD-10-CM | POA: Diagnosis not present

## 2020-09-21 DIAGNOSIS — Z79899 Other long term (current) drug therapy: Secondary | ICD-10-CM | POA: Insufficient documentation

## 2020-09-21 DIAGNOSIS — Y92410 Unspecified street and highway as the place of occurrence of the external cause: Secondary | ICD-10-CM | POA: Diagnosis not present

## 2020-09-21 DIAGNOSIS — Z7951 Long term (current) use of inhaled steroids: Secondary | ICD-10-CM | POA: Diagnosis not present

## 2020-09-21 DIAGNOSIS — Z91040 Latex allergy status: Secondary | ICD-10-CM | POA: Diagnosis not present

## 2020-09-21 DIAGNOSIS — S0990XA Unspecified injury of head, initial encounter: Secondary | ICD-10-CM | POA: Diagnosis not present

## 2020-09-21 DIAGNOSIS — S199XXA Unspecified injury of neck, initial encounter: Secondary | ICD-10-CM | POA: Diagnosis not present

## 2020-09-21 HISTORY — DX: Nontoxic multinodular goiter: E04.2
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
## 2020-09-24 ENCOUNTER — Telehealth: Payer: Self-pay
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
## 2020-09-28 ENCOUNTER — Encounter: Payer: Self-pay | Admitting: Family Medicine

## 2020-09-28 ENCOUNTER — Other Ambulatory Visit: Payer: Self-pay

## 2020-09-28 ENCOUNTER — Ambulatory Visit (INDEPENDENT_AMBULATORY_CARE_PROVIDER_SITE_OTHER): Payer: No Typology Code available for payment source | Admitting: Family Medicine

## 2020-09-28 VITALS — BP 128/84 | HR 86 | Ht 61.0 in | Wt 205.0 lb

## 2020-09-28 DIAGNOSIS — S060X0A Concussion without loss of consciousness, initial encounter: Secondary | ICD-10-CM | POA: Insufficient documentation

## 2020-09-28 DIAGNOSIS — R2689 Other abnormalities of gait and mobility: Secondary | ICD-10-CM | POA: Diagnosis not present
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
## 2020-09-28 NOTE — Patient Instructions (Signed)
Good to see you 200mg  CoQ10 Eat salmon 3x a week 2000 IU of Vitamin D Vestibular will call you See me again in 2 weeks

## 2020-10-01 ENCOUNTER — Encounter: Payer: Self-pay | Admitting: Family Medicine

## 2020-10-02 ENCOUNTER — Telehealth: Payer: Self-pay | Admitting: Family Medicine

## 2020-10-02 NOTE — Telephone Encounter (Signed)
Patient called stating that her job told her the paperwork submitted was incorrect. She said that they need the whole treatment plan and how she is to go from working 20 hours a week (as stated in her paperwork) to the normal 40. She also said that Dr Tamala Julian released her to go back to work on the 21st so the return to work date would need to be updated.  Please advise.

## 2020-10-05 NOTE — Telephone Encounter (Signed)
Spoke with patient regarding paperwork. Explained that what was discussed with Dr. Tamala Julian was returning on the 20th per his note and that the plan moving forward is contingent upon her evaluation on 10/08/2020, thus it was not included on accomodation paperwork. Patient voices understanding. Next appt 10/08/2020.
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
## 2020-10-08 ENCOUNTER — Other Ambulatory Visit: Payer: Self-pay

## 2020-10-08 ENCOUNTER — Encounter: Payer: Self-pay | Admitting: Family Medicine

## 2020-10-08 ENCOUNTER — Telehealth: Payer: Self-pay | Admitting: Family Medicine

## 2020-10-08 ENCOUNTER — Ambulatory Visit (INDEPENDENT_AMBULATORY_CARE_PROVIDER_SITE_OTHER): Payer: No Typology Code available for payment source | Admitting: Family Medicine

## 2020-10-08 DIAGNOSIS — M2550 Pain in unspecified joint: Secondary | ICD-10-CM

## 2020-10-08 DIAGNOSIS — R42 Dizziness and giddiness: Secondary | ICD-10-CM

## 2020-10-08 DIAGNOSIS — S060X0D Concussion without loss of consciousness, subsequent encounter: Secondary | ICD-10-CM

## 2020-10-08 LAB — COMPREHENSIVE METABOLIC PANEL
ALT: 17 U/L (ref 0–35)
AST: 14 U/L (ref 0–37)
Albumin: 4 g/dL (ref 3.5–5.2)
Alkaline Phosphatase: 69 U/L (ref 39–117)
BUN: 19 mg/dL (ref 6–23)
CO2: 30 mEq/L (ref 19–32)
Calcium: 10 mg/dL (ref 8.4–10.5)
Chloride: 100 mEq/L (ref 96–112)
Creatinine, Ser: 1.06 mg/dL (ref 0.40–1.20)
GFR: 62.93 mL/min (ref >60.00)
Glucose, Bld: 86 mg/dL (ref 70–99)
Potassium: 3.3 mEq/L — ABNORMAL LOW (ref 3.5–5.1)
Sodium: 136 mEq/L (ref 135–145)
Total Bilirubin: 0.6 mg/dL (ref 0.2–1.2)
Total Protein: 8.2 g/dL (ref 6.0–8.3)

## 2020-10-08 LAB — CBC WITH DIFFERENTIAL/PLATELET
Basophils Absolute: 0.1 10*3/uL (ref 0.0–0.1)
Basophils Relative: 0.8 % (ref 0.0–3.0)
Eosinophils Absolute: 0.2 10*3/uL (ref 0.0–0.7)
Eosinophils Relative: 2 % (ref 0.0–5.0)
HCT: 38.2 % (ref 36.0–46.0)
Hemoglobin: 12.6 g/dL (ref 12.0–15.0)
Lymphocytes Relative: 23 % (ref 12.0–46.0)
Lymphs Abs: 1.8 10*3/uL (ref 0.7–4.0)
MCHC: 33 g/dL (ref 30.0–36.0)
MCV: 85.2 fl (ref 78.0–100.0)
Monocytes Absolute: 0.8 10*3/uL (ref 0.1–1.0)
Monocytes Relative: 9.9 % (ref 3.0–12.0)
Neutro Abs: 5.1 10*3/uL (ref 1.4–7.7)
Neutrophils Relative %: 64.3 % (ref 43.0–77.0)
Platelets: 327 10*3/uL (ref 150.0–400.0)
RBC: 4.48 Mil/uL (ref 3.87–5.11)
RDW: 14.3 % (ref 11.5–15.5)
WBC: 8 10*3/uL (ref 4.0–10.5)

## 2020-10-08 LAB — IBC PANEL
Iron: 52 ug/dL (ref 42–145)
Saturation Ratios: 12 % — ABNORMAL LOW (ref 20.0–50.0)
Transferrin: 310 mg/dL (ref 212.0–360.0)

## 2020-10-08 LAB — SEDIMENTATION RATE: Sed Rate: 77 mm/hr — ABNORMAL HIGH (ref 0–20)

## 2020-10-08 LAB — VITAMIN D 25 HYDROXY (VIT D DEFICIENCY, FRACTURES): VITD: 18.3 ng/mL — ABNORMAL LOW (ref 30.00–100.00)

## 2020-10-08 LAB — TSH: TSH: 1.72 u[IU]/mL (ref 0.35–4.50)

## 2020-10-08 MED ORDER — MECLIZINE HCL 12.5 MG PO TABS: 12.5000 mg | ORAL_TABLET | Freq: Two times a day (BID) | ORAL | 0 refills | Status: DC | PRN

## 2020-10-08 NOTE — Addendum Note (Signed)
Addended by: Lyndal Pulley on: 10/08/2020 09:41 PM   Modules accepted: Orders

## 2020-10-08 NOTE — Patient Instructions (Addendum)
[id number] See me again at 11:00am on December 30th

## 2020-10-08 NOTE — Assessment & Plan Note (Addendum)
Patient does have more of her concussion symptoms still remaining.  If anything patient's dizziness seems to have worsened.  Patient does not have any weakness but is having significant amount of signs and symptoms that is consistent with vertigo.  Patient given a low dose of meclizine.  Difficult to treat with patient having significant number of different allergies.  We discussed with this being a holiday weekend if any worsening symptoms patient does need to seek medical attention immediately.  Laboratory work-up ordered and will get an MRI secondary to the severity of the dizziness.  I do want to hold patient out of work at this time. F/u again in 1 week just to check  PT referred but not til Jan 10th  Discussed with patient at length that worsenign symptoms need to go to ER. Patient verbalized understanding

## 2020-10-08 NOTE — Telephone Encounter (Signed)
Call patient after he had an elevated sedimentation rate.  Talk to patient for greater than 15 minutes at 3:00 today.  Patient states that she is feeling a little bit better at this time.  Due to patient's dizziness and elevated sedimentation rate I did discuss different things acute causes including autoimmune or potentially infectious etiology.  Patient's white blood cell count does seem to be normal.  Patient does complain a little bit of some more abdominal pain that she is having.  We discussed with her that any worsening headache, dizziness, abdominal pain she needs to go to the emergency room.  Patient states that because she is feeling better she would like to avoid the right now.  Patient is going to try 1 ibuprofen but has been told by her other doctors to avoid using it regularly.  Patient does seem to understand the risks and will follow up with me next week otherwise.

## 2020-10-08 NOTE — Telephone Encounter (Signed)
Called patient again at 9pm tonight. Patient states she was feeling better.  Has only been laying down, headache is better and even the dizziness is better. Still does not feel good though.  Headache is still constant, no new visual changes, no new weakness.  Did discuss with her due to the elevated ESR and the continued headache, even though the previous CT scan was normal I would feel more comfortable again with another CT scan and likely a CT angiogram until we are able to get the MRI.  Patient states she has her 103 year old daughter with her. No transportation at moment. With her feeling a little better she would like to wait until her brother gets home if she was going to the ER. This would be 1230 or 1 am  Discussed the signs and symptoms again and would need to call 911 and not wait but I am reassured she is feeling a little better.  She said she would send me a message in my chart when she decides what to do.  Told her I did document this phone call so when she gets to the ER they can see our conversation.  Patient verbalized understanding and was appreciative    With patient dizziness and headache, elevated ESR would like further evaluation with either repeat head and neck  CT or CT angio/venogram  OR IDEALLY if possible MRI/MRV of head to rule out vasculitis, elevated intrcranial pressure, cerebellar ectopia or even venous thrombus.

## 2020-10-09 ENCOUNTER — Encounter: Payer: Self-pay | Admitting: Family Medicine

## 2020-10-11 ENCOUNTER — Encounter (HOSPITAL_BASED_OUTPATIENT_CLINIC_OR_DEPARTMENT_OTHER): Payer: Self-pay | Admitting: Emergency Medicine

## 2020-10-11 ENCOUNTER — Emergency Department (HOSPITAL_BASED_OUTPATIENT_CLINIC_OR_DEPARTMENT_OTHER)
Admission: EM | Admit: 2020-10-11 | Discharge: 2020-10-12 | Disposition: A | Discharge status: Home / Self Care | Payer: No Typology Code available for payment source | Attending: Emergency Medicine | Admitting: Emergency Medicine

## 2020-10-11 ENCOUNTER — Other Ambulatory Visit: Payer: Self-pay

## 2020-10-11 ENCOUNTER — Emergency Department (HOSPITAL_BASED_OUTPATIENT_CLINIC_OR_DEPARTMENT_OTHER): Payer: No Typology Code available for payment source

## 2020-10-11 DIAGNOSIS — I10 Essential (primary) hypertension: Secondary | ICD-10-CM | POA: Diagnosis not present

## 2020-10-11 DIAGNOSIS — Z79899 Other long term (current) drug therapy: Secondary | ICD-10-CM | POA: Insufficient documentation

## 2020-10-11 DIAGNOSIS — R110 Nausea: Secondary | ICD-10-CM | POA: Diagnosis not present

## 2020-10-11 DIAGNOSIS — R42 Dizziness and giddiness: Secondary | ICD-10-CM | POA: Diagnosis not present

## 2020-10-11 DIAGNOSIS — Z91040 Latex allergy status: Secondary | ICD-10-CM | POA: Diagnosis not present

## 2020-10-11 DIAGNOSIS — J45909 Unspecified asthma, uncomplicated: Secondary | ICD-10-CM | POA: Diagnosis not present

## 2020-10-11 DIAGNOSIS — Y92410 Unspecified street and highway as the place of occurrence of the external cause: Secondary | ICD-10-CM | POA: Diagnosis not present

## 2020-10-11 DIAGNOSIS — Z20822 Contact with and (suspected) exposure to covid-19: Secondary | ICD-10-CM | POA: Diagnosis not present

## 2020-10-11 DIAGNOSIS — R519 Headache, unspecified: Secondary | ICD-10-CM | POA: Diagnosis not present

## 2020-10-11 LAB — RESP PANEL BY RT-PCR (FLU A&B, COVID) ARPGX2
Influenza A by PCR: NEGATIVE
Influenza B by PCR: NEGATIVE
SARS Coronavirus 2 by RT PCR: NEGATIVE

## 2020-10-11 LAB — BASIC METABOLIC PANEL
Anion gap: 10 (ref 5–15)
BUN: 21 mg/dL — ABNORMAL HIGH (ref 6–20)
CO2: 25 mmol/L (ref 22–32)
Calcium: 10.1 mg/dL (ref 8.9–10.3)
Chloride: 98 mmol/L (ref 98–111)
Creatinine, Ser: 1.1 mg/dL — ABNORMAL HIGH (ref 0.44–1.00)
GFR, Estimated: >60 mL/min (ref >60)
Glucose, Bld: 106 mg/dL — ABNORMAL HIGH (ref 70–99)
Potassium: 3.5 mmol/L (ref 3.5–5.1)
Sodium: 133 mmol/L — ABNORMAL LOW (ref 135–145)

## 2020-10-11 MED ORDER — IOHEXOL 350 MG/ML SOLN
100.0000 mL | Freq: Once | INTRAVENOUS | Status: AC | PRN
  Administered 2020-10-11: 100 mL via INTRAVENOUS

## 2020-10-11 NOTE — Discharge Instructions (Addendum)
Follow up with your provider. Return to ER for worsening or concerning symptoms.
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
## 2020-10-11 NOTE — ED Notes (Signed)
Pt in CT.

## 2020-10-12 LAB — PTH, INTACT AND CALCIUM
Calcium: 10 mg/dL (ref 8.6–10.2)
PTH: 51 pg/mL (ref 14–64)

## 2020-10-12 LAB — CALCIUM, IONIZED: Calcium, Ion: 5.38 mg/dL (ref 4.8–5.6)

## 2020-10-14 NOTE — Progress Notes (Signed)
[name] [address] [place] [address] Phone: [phone] Subjective:   Audrey [name], am serving as a scribe for Dr. Antoine Primas. This visit occurred during the SARS-CoV-2 public health emergency.  Safety protocols were in place, including screening questions prior to the visit, additional usage of staff PPE, and extensive cleaning of exam room while observing appropriate contact time as indicated for disinfecting solutions.   I'm seeing this patient by the request  of:  Leilani Able, MD  CC: Head injury follow-up  [id number]   10/08/2020 Patient does have more of her concussion symptoms still remaining.  If anything patient's dizziness seems to have worsened.  Patient does not have any weakness but is having significant amount of signs and symptoms that is consistent with vertigo.  Patient given a low dose of meclizine.  Difficult to treat with patient having significant number of different allergies.  We discussed with this being a holiday weekend if any worsening symptoms patient does need to seek medical attention immediately.  Laboratory work-up ordered and will get an MRI secondary to the severity of the dizziness.  I do want to hold patient out of work at this time. F/u again in 1 week just to check  PT referred but not til Jan 10th  Discussed with patient at length that worsenign symptoms need to go to ER. Patient verbalized understanding    Update 10/15/2020 Audrey [name] is a 44 y.o. female coming in with complaint of head injury. Patient did go into ED on Sunday due to pressure in head, dizziness, nausea. Does still see chiropractor for shoulder. Symptoms do not worsen with adjustments. Continues to be photophobic and dizzy. Today is a better day but she has not done a lot today.    I had increasing concern with patient's symptoms.  It was encouraging patient go to the emergency department.  Patient did wait 48 hours.  Patient did  go and had imaging including a CT angio of the head and neck with a CT venogram these were independently visualized by me.  No significant abnormalities noted.  Patient's laboratory test did show an elevated sedimentation rate previously.  Past Medical History:  Diagnosis Date  . Abortion history   . Anemia   . Asthma   . Fibroid tumor   . Hypertension   . Migraine headache   . Multiple thyroid nodules   . Shingles    Past Surgical History:  Procedure Laterality Date  . CESAREAN SECTION    . COLONOSCOPY, ESOPHAGOGASTRODUODENOSCOPY (EGD) AND ESOPHAGEAL DILATION    . CYSTECTOMY     breast   Social History   Socioeconomic History  . Marital status: Single    Spouse name: Not on file  . Number of children: Not on file  . Years of education: Not on file  . Highest education level: Not on file  Occupational History  . Not on file  Tobacco Use  . Smoking status: Never Smoker  . Smokeless tobacco: Never Used  Vaping Use  . Vaping Use: Never used  Substance and Sexual Activity  . Alcohol use: Yes    Alcohol/week: 0.0 standard drinks    Comment: rare  . Drug use: No  . Sexual activity: Not on file  Other Topics Concern  . Not on file  Social History Narrative   Lives with children and boyfriend in a one story home.     Works for [employer].     Education:  BS   Social Determinants of Health   Financial Resource Strain: Not on file  Food Insecurity: Not on file  Transportation Needs: Not on file  Physical Activity: Not on file  Stress: Not on file  Social Connections: Not on file   Allergies  Allergen Reactions  . Macrolides And Ketolides Other (See Comments)    Made eye swelling worse  . Penicillins Other (See Comments), Itching and Rash    Eye swelling  . Shellfish Allergy Anaphylaxis  . Advair Diskus [Fluticasone-Salmeterol] Nausea Only    Weakness headaches  . Erythromycin Other (See Comments)    Made eye infection worse   . Lisinopril Other (See Comments) and  Swelling    Caused lips to swell  . Nyamyc [Nystatin] Other (See Comments)    Made eye infection worse  . Almond (Diagnostic) Other (See Comments)    Stomach pain  . Diphenhydramine Hcl (Sleep) Other (See Comments)  . Neomycin Swelling  . Asa [Aspirin] Nausea Only  . Latex Rash   Family History  Problem Relation Age of Onset  . Hypertension Father        Living, 33  . High Cholesterol Father   . Hypertension Mother   . Non-Hodgkin's lymphoma Mother        Deceased, 77  . Cancer Maternal Grandfather   . Cancer Paternal Grandfather   . Heart disease Maternal Uncle   . Heart disease Paternal Uncle   . Healthy Brother   . Asthma Son   . Asthma Daughter      Current Outpatient Medications (Cardiovascular):  .  cloNIDine (CATAPRES) 0.2 MG tablet, Take 0.2 mg by mouth daily.  Current Outpatient Medications (Respiratory):  .  albuterol (PROVENTIL HFA;VENTOLIN HFA) 108 (90 BASE) MCG/ACT inhaler, Inhale 2 puffs into the lungs every 6 (six) hours as needed for wheezing or shortness of breath. For shortness of breath  Current Outpatient Medications (Analgesics):  .  acetaminophen (TYLENOL) 500 MG tablet, Take 1,000 mg by mouth every 6 (six) hours as needed for mild pain. Marland Kitchen  ibuprofen (ADVIL,MOTRIN) 200 MG tablet, Take 400 mg by mouth every 6 (six) hours as needed for moderate pain.    Current Outpatient Medications (Other):  .  meclizine (ANTIVERT) 12.5 MG tablet, Take 1 tablet (12.5 mg total) by mouth 2 (two) times daily as needed for dizziness. .  clonazePAM (KLONOPIN) 0.5 MG tablet, Take 0.5 mg by mouth 3 (three) times daily as needed for anxiety.   Reviewed prior external information including notes and imaging from  primary care provider As well as notes that were available from care everywhere and other healthcare systems.  Past medical history, social, surgical and family history all reviewed in electronic medical record.  No pertanent information unless stated regarding  to the chief complaint.   Review of Systems:  No , diarrhea, constipation, abdominal pain, skin rash, fevers, chills, night sweats, weight loss, swollen lymph nodes, body aches, joint swelling, chest pain, shortness of breath,  POSITIVE muscle aches, body aches, visual changes, headache, nausea, dizziness and confusion  Objective  Blood pressure 122/88, pulse 94, height 5\' 1"  (1.549 m), weight 205 lb (93 kg), SpO2 99 %.   General: No apparent distress alert and oriented x3 mood and affect normal, dressed appropriately.  HEENT: Pupils equal, extraocular movements intact patient does still have some mild nystagmus noted.  Does seem to be fatigued with any type of testing. Respiratory: Patient's speak in full sentences and does not appear short of breath  Cardiovascular: No lower extremity edema, non tender, no erythema  Patient's ambulation does seem to be improved. Patient still has some balance issues when she does close her eyes. Cranial nerves II through XII are intact.  Good grip strength.  Neurovascular intact in all extremities and deep tendon reflexes are intact   Impression and Recommendations:     The above documentation has been reviewed and is accurate and complete Audrey Pulley, DO

## 2020-10-15 ENCOUNTER — Other Ambulatory Visit: Payer: Self-pay

## 2020-10-15 ENCOUNTER — Ambulatory Visit (INDEPENDENT_AMBULATORY_CARE_PROVIDER_SITE_OTHER): Payer: No Typology Code available for payment source | Admitting: Family Medicine

## 2020-10-15 ENCOUNTER — Encounter: Payer: Self-pay | Admitting: Family Medicine

## 2020-10-15 DIAGNOSIS — R700 Elevated erythrocyte sedimentation rate: Secondary | ICD-10-CM

## 2020-10-15 DIAGNOSIS — S060X0D Concussion without loss of consciousness, subsequent encounter: Secondary | ICD-10-CM

## 2020-10-15 HISTORY — DX: Elevated erythrocyte sedimentation rate: R70.0

## 2020-10-15 NOTE — Assessment & Plan Note (Addendum)
Will recheck in 2 weeks and make sure it is downtrending no sign of any vasculitis on imaging.

## 2020-10-15 NOTE — Patient Instructions (Addendum)
[facility] [phone] PT will be great Try mediation if dizziness does not improve If things worsen go to ED See me in 3 weeks
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
## 2020-10-26 DIAGNOSIS — M542 Cervicalgia: Secondary | ICD-10-CM | POA: Diagnosis not present

## 2020-10-26 DIAGNOSIS — R269 Unspecified abnormalities of gait and mobility: Secondary | ICD-10-CM | POA: Diagnosis not present

## 2020-10-27 ENCOUNTER — Telehealth: Payer: Self-pay | Admitting: Family Medicine

## 2020-10-27 NOTE — Telephone Encounter (Signed)
Left message for patient that FMLA paperwork has been filled out and that a new work note was sent to her in [place].

## 2020-10-27 NOTE — Telephone Encounter (Signed)
Pt called, she is due to RTW 11/03/2020. She just had her first PT session yesterday, is having her MRI on 1/18, and seeing [place] on 1/20.  She would like this date extended until she gets through all of the above and more PT ( which made her dizzy yesterday ). I have put a blank FMLA form in the paperwork bin for completion.

## 2020-10-28 DIAGNOSIS — M542 Cervicalgia: Secondary | ICD-10-CM | POA: Diagnosis not present

## 2020-10-28 DIAGNOSIS — R269 Unspecified abnormalities of gait and mobility: Secondary | ICD-10-CM | POA: Diagnosis not present

## 2020-11-03 ENCOUNTER — Encounter: Payer: Self-pay | Admitting: Family Medicine

## 2020-11-03 ENCOUNTER — Other Ambulatory Visit: Payer: Self-pay

## 2020-11-03 ENCOUNTER — Ambulatory Visit
Admission: RE | Admit: 2020-11-03 | Discharge: 2020-11-03 | Disposition: A | Discharge status: Home / Self Care | Payer: No Typology Code available for payment source | Source: Ambulatory Visit | Attending: Family Medicine | Admitting: Family Medicine

## 2020-11-03 DIAGNOSIS — R269 Unspecified abnormalities of gait and mobility: Secondary | ICD-10-CM | POA: Diagnosis not present

## 2020-11-03 DIAGNOSIS — M542 Cervicalgia: Secondary | ICD-10-CM | POA: Diagnosis not present

## 2020-11-03 DIAGNOSIS — R42 Dizziness and giddiness: Secondary | ICD-10-CM

## 2020-11-03 DIAGNOSIS — S0990XA Unspecified injury of head, initial encounter: Secondary | ICD-10-CM | POA: Diagnosis not present

## 2020-11-03 MED ORDER — GADOBENATE DIMEGLUMINE 529 MG/ML IV SOLN
19.0000 mL | Freq: Once | INTRAVENOUS | Status: AC | PRN
  Administered 2020-11-03: 19 mL via INTRAVENOUS

## 2020-11-04 ENCOUNTER — Other Ambulatory Visit: Payer: Self-pay

## 2020-11-05 ENCOUNTER — Ambulatory Visit (INDEPENDENT_AMBULATORY_CARE_PROVIDER_SITE_OTHER): Payer: No Typology Code available for payment source | Admitting: Family Medicine

## 2020-11-05 ENCOUNTER — Other Ambulatory Visit: Payer: Self-pay

## 2020-11-05 ENCOUNTER — Encounter: Payer: Self-pay | Admitting: Family Medicine

## 2020-11-05 VITALS — BP 128/84 | HR 88 | Ht 61.0 in | Wt 209.0 lb

## 2020-11-05 DIAGNOSIS — M542 Cervicalgia: Secondary | ICD-10-CM | POA: Diagnosis not present

## 2020-11-05 DIAGNOSIS — M2550 Pain in unspecified joint: Secondary | ICD-10-CM | POA: Diagnosis not present

## 2020-11-05 DIAGNOSIS — R269 Unspecified abnormalities of gait and mobility: Secondary | ICD-10-CM | POA: Diagnosis not present

## 2020-11-05 DIAGNOSIS — R9389 Abnormal findings on diagnostic imaging of other specified body structures: Secondary | ICD-10-CM

## 2020-11-05 DIAGNOSIS — S060X0D Concussion without loss of consciousness, subsequent encounter: Secondary | ICD-10-CM | POA: Diagnosis not present

## 2020-11-05 DIAGNOSIS — G44329 Chronic post-traumatic headache, not intractable: Secondary | ICD-10-CM

## 2020-11-05 LAB — CBC WITH DIFFERENTIAL/PLATELET
Basophils Absolute: 0.1 10*3/uL (ref 0.0–0.1)
Basophils Relative: 1 % (ref 0.0–3.0)
Eosinophils Absolute: 0.1 10*3/uL (ref 0.0–0.7)
Eosinophils Relative: 1.5 % (ref 0.0–5.0)
HCT: 36.7 % (ref 36.0–46.0)
Hemoglobin: 12.2 g/dL (ref 12.0–15.0)
Lymphocytes Relative: 20.6 % (ref 12.0–46.0)
Lymphs Abs: 1.8 10*3/uL (ref 0.7–4.0)
MCHC: 33.3 g/dL (ref 30.0–36.0)
MCV: 85.1 fl (ref 78.0–100.0)
Monocytes Absolute: 0.5 10*3/uL (ref 0.1–1.0)
Monocytes Relative: 5.8 % (ref 3.0–12.0)
Neutro Abs: 6.2 10*3/uL (ref 1.4–7.7)
Neutrophils Relative %: 71.1 % (ref 43.0–77.0)
Platelets: 306 10*3/uL (ref 150.0–400.0)
RBC: 4.31 Mil/uL (ref 3.87–5.11)
RDW: 14 % (ref 11.5–15.5)
WBC: 8.8 10*3/uL (ref 4.0–10.5)

## 2020-11-05 LAB — COMPREHENSIVE METABOLIC PANEL
ALT: 18 U/L (ref 0–35)
AST: 15 U/L (ref 0–37)
Albumin: 4 g/dL (ref 3.5–5.2)
Alkaline Phosphatase: 69 U/L (ref 39–117)
BUN: 15 mg/dL (ref 6–23)
CO2: 34 mEq/L — ABNORMAL HIGH (ref 19–32)
Calcium: 10.9 mg/dL — ABNORMAL HIGH (ref 8.4–10.5)
Chloride: 99 mEq/L (ref 96–112)
Creatinine, Ser: 0.99 mg/dL (ref 0.40–1.20)
GFR: 68.27 mL/min (ref >60.00)
Glucose, Bld: 126 mg/dL — ABNORMAL HIGH (ref 70–99)
Potassium: 3.7 mEq/L (ref 3.5–5.1)
Sodium: 136 mEq/L (ref 135–145)
Total Bilirubin: 0.4 mg/dL (ref 0.2–1.2)
Total Protein: 8 g/dL (ref 6.0–8.3)

## 2020-11-05 LAB — C-REACTIVE PROTEIN: CRP: <1 mg/dL (ref 0.5–20.0)

## 2020-11-05 LAB — SEDIMENTATION RATE: Sed Rate: 52 mm/hr — ABNORMAL HIGH (ref 0–20)

## 2020-11-05 NOTE — Patient Instructions (Addendum)
[facility] will call you Neurology-Will call you Get your eyes checked Out of work for next 3 weeks See [place] in 2-3 weeks
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
## 2020-11-05 NOTE — Telephone Encounter (Signed)
Saw patient today in office. She will fax [place] the paperwork.

## 2020-11-06 ENCOUNTER — Encounter: Payer: Self-pay | Admitting: Family Medicine

## 2020-11-06 ENCOUNTER — Encounter: Payer: Self-pay | Admitting: Neurology
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
## 2020-11-10 DIAGNOSIS — R269 Unspecified abnormalities of gait and mobility: Secondary | ICD-10-CM | POA: Diagnosis not present

## 2020-11-10 DIAGNOSIS — M542 Cervicalgia: Secondary | ICD-10-CM | POA: Diagnosis not present

## 2020-11-12 DIAGNOSIS — I781 Nevus, non-neoplastic: Secondary | ICD-10-CM | POA: Diagnosis not present

## 2020-11-13 DIAGNOSIS — M542 Cervicalgia: Secondary | ICD-10-CM | POA: Diagnosis not present

## 2020-11-13 DIAGNOSIS — R269 Unspecified abnormalities of gait and mobility: Secondary | ICD-10-CM | POA: Diagnosis not present

## 2020-11-18 DIAGNOSIS — M542 Cervicalgia: Secondary | ICD-10-CM | POA: Diagnosis not present

## 2020-11-18 DIAGNOSIS — R269 Unspecified abnormalities of gait and mobility: Secondary | ICD-10-CM | POA: Diagnosis not present
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
## 2020-11-20 ENCOUNTER — Other Ambulatory Visit: Payer: Self-pay

## 2020-11-20 ENCOUNTER — Encounter: Payer: Self-pay | Admitting: Family Medicine

## 2020-11-20 ENCOUNTER — Ambulatory Visit (INDEPENDENT_AMBULATORY_CARE_PROVIDER_SITE_OTHER): Payer: No Typology Code available for payment source | Admitting: Family Medicine

## 2020-11-20 DIAGNOSIS — S060X0D Concussion without loss of consciousness, subsequent encounter: Secondary | ICD-10-CM | POA: Diagnosis not present

## 2020-11-20 DIAGNOSIS — E042 Nontoxic multinodular goiter: Secondary | ICD-10-CM | POA: Diagnosis not present

## 2020-11-20 DIAGNOSIS — R700 Elevated erythrocyte sedimentation rate: Secondary | ICD-10-CM

## 2020-11-20 DIAGNOSIS — M542 Cervicalgia: Secondary | ICD-10-CM | POA: Diagnosis not present

## 2020-11-20 DIAGNOSIS — R269 Unspecified abnormalities of gait and mobility: Secondary | ICD-10-CM | POA: Diagnosis not present

## 2020-11-20 NOTE — Assessment & Plan Note (Signed)
Patient sees another provider for this.  Patient has had some mild hypercalcemia.  Was going to have her thyroid removed previously and encouraged her to follow-up with her regular physician for this.  We will discuss with the hypercalcemia noted in the last blood test we should recheck at follow-up.

## 2020-11-20 NOTE — Assessment & Plan Note (Signed)
Downtrending.  Will recheck one more time at follow-up.

## 2020-11-20 NOTE — Assessment & Plan Note (Signed)
Patient continues to make improvement but it is slow going.  I would like patient to consider the possibility of working at home and we will see if she is able to do so with her work.  If not possibly 3 weeks we can potentially do an abbreviated schedule of 20 hours.  Patient will continue with physical therapy.  Patient is still frustrated a little bit with some of the midline but feel getting her in her normal routine will be beneficial for her at this point.  Patient has seen neurosurgery and do feel that the abnormal findings on MRI were benign.  Patient is going to follow-up with neurology for some of the headaches but these are improving as well.  Patient will follow up with me again in 3 to 4 weeks to make sure he continues to improve

## 2020-11-20 NOTE — Patient Instructions (Addendum)
Continue PT Hold on labs right now will recheck Ca, PTH, TSH, and ESR Keep it up  See me in 3-4 weeks

## 2020-11-26 DIAGNOSIS — M542 Cervicalgia: Secondary | ICD-10-CM | POA: Diagnosis not present

## 2020-11-26 DIAGNOSIS — R269 Unspecified abnormalities of gait and mobility: Secondary | ICD-10-CM | POA: Diagnosis not present

## 2020-11-27 ENCOUNTER — Encounter: Payer: Self-pay | Admitting: Family Medicine

## 2020-11-27 NOTE — Telephone Encounter (Signed)
Patient called regarding this message that was sent through [place]. She said that she is scheduled to go back to work on Monday, Feb 14th but she has not been released from PT and is not able to drive. They are going to try to accommodate her to be able to work from home but they would need more time to be able to get equipment to her and set up. She asked if a note could be written extending her return to work date to Monday, Feb 21st.   Please advise.

## 2020-11-30 DIAGNOSIS — R269 Unspecified abnormalities of gait and mobility: Secondary | ICD-10-CM | POA: Diagnosis not present

## 2020-11-30 DIAGNOSIS — M542 Cervicalgia: Secondary | ICD-10-CM | POA: Diagnosis not present

## 2020-11-30 NOTE — Telephone Encounter (Signed)
Can you help with this letter?

## 2020-12-02 ENCOUNTER — Encounter: Payer: Self-pay | Admitting: Family Medicine

## 2020-12-03 ENCOUNTER — Encounter: Payer: Self-pay | Admitting: Neurology

## 2020-12-03 ENCOUNTER — Ambulatory Visit (INDEPENDENT_AMBULATORY_CARE_PROVIDER_SITE_OTHER): Payer: No Typology Code available for payment source | Admitting: Neurology

## 2020-12-03 ENCOUNTER — Other Ambulatory Visit: Payer: Self-pay

## 2020-12-03 VITALS — BP 128/85 | HR 92 | Ht 61.0 in | Wt 206.6 lb

## 2020-12-03 DIAGNOSIS — R269 Unspecified abnormalities of gait and mobility: Secondary | ICD-10-CM | POA: Diagnosis not present

## 2020-12-03 DIAGNOSIS — S060X0D Concussion without loss of consciousness, subsequent encounter: Secondary | ICD-10-CM

## 2020-12-03 DIAGNOSIS — G43809 Other migraine, not intractable, without status migrainosus: Secondary | ICD-10-CM | POA: Diagnosis not present

## 2020-12-03 DIAGNOSIS — M542 Cervicalgia: Secondary | ICD-10-CM | POA: Diagnosis not present

## 2020-12-03 MED ORDER — QULIPTA 60 MG PO TABS: 60.0000 mg | ORAL_TABLET | Freq: Every day | ORAL | 5 refills | Status: DC
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
## 2020-12-03 NOTE — Patient Instructions (Signed)
1.  Start Qulipta 60mg  daily 2.  Limit use of pain relievers to no more than 2 days out of week to prevent risk of rebound or medication-overuse headache. 3.  Keep headache diary 4.  Follow up 4 months.

## 2020-12-04 NOTE — Progress Notes (Signed)
Disability forms received from [employer].  Please send last ov notes.

## 2020-12-09 DIAGNOSIS — M542 Cervicalgia: Secondary | ICD-10-CM | POA: Diagnosis not present

## 2020-12-09 DIAGNOSIS — R269 Unspecified abnormalities of gait and mobility: Secondary | ICD-10-CM | POA: Diagnosis not present

## 2020-12-15 DIAGNOSIS — M542 Cervicalgia: Secondary | ICD-10-CM | POA: Diagnosis not present

## 2020-12-15 DIAGNOSIS — R269 Unspecified abnormalities of gait and mobility: Secondary | ICD-10-CM | POA: Diagnosis not present

## 2020-12-17 DIAGNOSIS — R269 Unspecified abnormalities of gait and mobility: Secondary | ICD-10-CM | POA: Diagnosis not present

## 2020-12-17 DIAGNOSIS — M542 Cervicalgia: Secondary | ICD-10-CM | POA: Diagnosis not present

## 2020-12-18 NOTE — Progress Notes (Signed)
[facility] [address] [place] [place] Phone: [phone] Subjective:   I Kandace Blitz am serving as a Education administrator for Dr. Hulan Saas.  This visit occurred during the SARS-CoV-2 public health emergency.  Safety protocols were in place, including screening questions prior to the visit, additional usage of staff PPE, and extensive cleaning of exam room while observing appropriate contact time as indicated for disinfecting solutions.   I'm seeing this patient by the request  of:  Lin Landsman, MD  CC: Head injury follow-up  [id number]   11/20/2020 Patient sees another provider for this.  Patient has had some mild hypercalcemia.  Was going to have her thyroid removed previously and encouraged her to follow-up with her regular physician for this.  We will discuss with the hypercalcemia noted in the last blood test we should recheck at follow-up.  Patient continues to make improvement but it is slow going.  I would like patient to consider the possibility of working at home and we will see if she is able to do so with her work.  If not possibly 3 weeks we can potentially do an abbreviated schedule of 20 hours.  Patient will continue with physical therapy.  Patient is still frustrated a little bit with some of the midline but feel getting her in her normal routine will be beneficial for her at this point.  Patient has seen neurosurgery and do feel that the abnormal findings on MRI were benign.  Patient is going to follow-up with neurology for some of the headaches but these are improving as well.  Patient will follow up with me again in 3 to 4 weeks to make sure he continues to improve  Update 12/21/2020 Lavana Huckeba Regner is a 45 y.o. female coming in with complaint of dizziness and headaches. Patient was seen by Dr. Tomi Likens since last visit.  Patient at that visit was not diagnosed with more of a vestibular migraine and started on Quilipta and was to follow-up in 4  months with neurology.  Patient has not started this medication and was given samples.  Patient states she is stressed and still having dizzy spells. Still having issues with driving. Going to PT. patient does feel like she has made progress.  Patient was to start full-time today but is continuing to have almost more anxiety with some of the work at the moment.  He is working at home and has everything set up at this time.  Patient did feel that she did do relatively well with the 20 hours.  States that sometimes when a lot of things are happening continues to have some difficulty.  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  Patient is set up at home for this.  Patient denies any new symptoms.  Actually states every time she works with physical therapy is making improvement.       Past Medical History:  Diagnosis Date  . Abortion history   . Anemia   . Asthma   . Fibroid tumor   . Hypertension   . Migraine headache   . Multiple thyroid nodules   . Shingles    Past Surgical History:  Procedure Laterality Date  . CESAREAN SECTION    . COLONOSCOPY, ESOPHAGOGASTRODUODENOSCOPY (EGD) AND ESOPHAGEAL DILATION    . CYSTECTOMY     breast   Social History   Socioeconomic History  . Marital  status: Single    Spouse name: Not on file  . Number of children: Not on file  . Years of education: Not on file  . Highest education level: Not on file  Occupational History  . Not on file  Tobacco Use  . Smoking status: Never Smoker  . Smokeless tobacco: Never Used  Vaping Use  . Vaping Use: Never used  Substance and Sexual Activity  . Alcohol use: Yes    Alcohol/week: 0.0 standard drinks    Comment: rare  . Drug use: No  . Sexual activity: Not on file  Other Topics Concern  . Not on file  Social History Narrative   Lives with children and boyfriend in a one story home.     Works for [employer].     Education: BS    Social Determinants of Health   Financial Resource Strain: Not on file  Food Insecurity: Not on file  Transportation Needs: Not on file  Physical Activity: Not on file  Stress: Not on file  Social Connections: Not on file   Allergies  Allergen Reactions  . Macrolides And Ketolides Other (See Comments)    Made eye swelling worse  . Penicillins Other (See Comments), Itching and Rash    Eye swelling  . Shellfish Allergy Anaphylaxis  . Advair Diskus [Fluticasone-Salmeterol] Nausea Only    Weakness headaches  . Erythromycin Other (See Comments)    Made eye infection worse   . Lisinopril Other (See Comments) and Swelling    Caused lips to swell  . Nyamyc [Nystatin] Other (See Comments)    Made eye infection worse  . Almond (Diagnostic) Other (See Comments)    Stomach pain  . Diphenhydramine Hcl (Sleep) Other (See Comments)  . Neomycin Swelling  . Asa [Aspirin] Nausea Only  . Latex Rash   Family History  Problem Relation Age of Onset  . Hypertension Father        Living, 34  . High Cholesterol Father   . Hypertension Mother   . Non-Hodgkin's lymphoma Mother        Deceased, 54  . Cancer Maternal Grandfather   . Cancer Paternal Grandfather   . Heart disease Maternal Uncle   . Heart disease Paternal Uncle   . Healthy Brother   . Asthma Son   . Asthma Daughter      Current Outpatient Medications (Cardiovascular):  .  cloNIDine (CATAPRES) 0.2 MG tablet, Take 0.2 mg by mouth daily. Marland Kitchen  triamterene-hydrochlorothiazide (MAXZIDE-25) 37.5-25 MG tablet, Take 1 tablet by mouth daily.  Current Outpatient Medications (Respiratory):  .  albuterol (PROVENTIL HFA;VENTOLIN HFA) 108 (90 BASE) MCG/ACT inhaler, Inhale 2 puffs into the lungs every 6 (six) hours as needed for wheezing or shortness of breath. For shortness of breath  Current Outpatient Medications (Analgesics):  .  acetaminophen (TYLENOL) 500 MG tablet, Take 1,000 mg by mouth every 6 (six) hours as needed for mild  pain. .  Atogepant (QULIPTA) 60 MG TABS, Take 60 mg by mouth daily.   Current Outpatient Medications (Other):  .  meclizine (ANTIVERT) 12.5 MG tablet, Take 1 tablet (12.5 mg total) by mouth 2 (two) times daily as needed for dizziness. .  nystatin ointment (MYCOSTATIN), Apply topically.   Reviewed prior external information including notes and imaging from  primary care provider As well as notes that were available from care everywhere and other healthcare systems.  Past medical history, social, surgical and family history all reviewed in electronic medical record.  No pertanent information  unless stated regarding to the chief complaint.   Review of Systems:  No visual changes, nausea, vomiting, diarrhea, constipation, abdominal pain, skin rash, fevers, chills, night sweats, weight loss, swollen lymph nodes, body aches, joint swelling, chest pain, shortness of breath, mood changes. POSITIVE muscle aches, dizziness and headaches  Objective  Blood pressure (!) 148/90, pulse 71, height 5\' 1"  (1.549 m), weight 207 lb (93.9 kg), SpO2 100 %.   General: No apparent distress alert and oriented x3 mood and affect normal, dressed appropriately.  HEENT: Pupils equal, extraocular movements intact patient did do very well with extraocular movements.  No significant nystagmus noted today.  Does have fullness noted of the throat and the thyroid is enlarged. Respiratory: Patient's speak in full sentences and does not appear short of breath  Cardiovascular: No lower extremity edema, non tender, no erythema  Gait normal with good balance and coordination.  Patient does have some mild difficulty with balance still noted especially with closing the eyes. MSK:  Non tender with full range of motion and good stability and symmetric strength and tone of shoulders, elbows, wrist, hip, knee and ankles bilaterally.     Impression and Recommendations:     The above documentation has been reviewed and is accurate  and complete Lyndal Pulley, DO

## 2020-12-21 ENCOUNTER — Ambulatory Visit (INDEPENDENT_AMBULATORY_CARE_PROVIDER_SITE_OTHER): Payer: No Typology Code available for payment source | Admitting: Family Medicine

## 2020-12-21 ENCOUNTER — Other Ambulatory Visit: Payer: Self-pay

## 2020-12-21 ENCOUNTER — Encounter: Payer: Self-pay | Admitting: Family Medicine

## 2020-12-21 VITALS — BP 148/90 | HR 71 | Ht 61.0 in | Wt 207.0 lb

## 2020-12-21 DIAGNOSIS — R269 Unspecified abnormalities of gait and mobility: Secondary | ICD-10-CM | POA: Diagnosis not present

## 2020-12-21 DIAGNOSIS — M542 Cervicalgia: Secondary | ICD-10-CM | POA: Diagnosis not present

## 2020-12-21 DIAGNOSIS — S060X0D Concussion without loss of consciousness, subsequent encounter: Secondary | ICD-10-CM | POA: Diagnosis not present

## 2020-12-21 DIAGNOSIS — R700 Elevated erythrocyte sedimentation rate: Secondary | ICD-10-CM

## 2020-12-21 DIAGNOSIS — M2550 Pain in unspecified joint: Secondary | ICD-10-CM | POA: Diagnosis not present

## 2020-12-21 HISTORY — DX: Hypercalcemia: E83.52

## 2020-12-21 LAB — SEDIMENTATION RATE: Sed Rate: 54 mm/hr — ABNORMAL HIGH (ref 0–20)

## 2020-12-21 LAB — COMPREHENSIVE METABOLIC PANEL
ALT: 15 U/L (ref 0–35)
AST: 13 U/L (ref 0–37)
Albumin: 3.9 g/dL (ref 3.5–5.2)
Alkaline Phosphatase: 62 U/L (ref 39–117)
BUN: 21 mg/dL (ref 6–23)
CO2: 30 mEq/L (ref 19–32)
Calcium: 10.4 mg/dL (ref 8.4–10.5)
Chloride: 101 mEq/L (ref 96–112)
Creatinine, Ser: 0.95 mg/dL (ref 0.40–1.20)
GFR: 71.67 mL/min (ref >60.00)
Glucose, Bld: 87 mg/dL (ref 70–99)
Potassium: 3.4 mEq/L — ABNORMAL LOW (ref 3.5–5.1)
Sodium: 137 mEq/L (ref 135–145)
Total Bilirubin: 0.3 mg/dL (ref 0.2–1.2)
Total Protein: 7.8 g/dL (ref 6.0–8.3)

## 2020-12-21 NOTE — Assessment & Plan Note (Signed)
We will recheck labs today  ?

## 2020-12-21 NOTE — Assessment & Plan Note (Signed)
Patient still has some vestibular deficiencies noted.  Patient does have some mild anxiety as well with patient returning back to work.  Had difficulty with her first 8-hour day.  I would like to get laboratory work-up again to rule out the hypercalcemia only noted as well as the elevated sedimentation rate.  Patient encouraged to follow-up with the primary care provider we discussed this previously which patient has not done as well as potentially endocrinology if these are abnormal.  Patient has not started the medication that was given to her by neurology for the vestibular migraines.  Discussed with her calling down to see if they can do something else.  It does look like they are going to start at 60 mg.  Patient was under the impression it would be a lower dose.  Patient will increase her work note to 30 hours a week so 6-hour shifts per day for the next 2 weeks and then 40 hours thereafter with a follow-up in 4 weeks.  Hopefully patient will be back to herself.  We did discuss the situational anxiety with her job and potentially talking to behavioral health which patient declined.  Patient will call us if she changes her mind on this.  Once again see patient again in 4 weeks.  Total time reviewing patient's chart as well as talking to patient 33 minutes.

## 2020-12-21 NOTE — Assessment & Plan Note (Signed)
We will recheck labs as well.

## 2020-12-21 NOTE — Patient Instructions (Addendum)
Good to see you Labs today I am glad you are getting better continue with PT 6 hours a day for 30 hours a week for 2 weeks. Then 40 hours a week after. See me again in 4 weeks

## 2020-12-22 LAB — PTH, INTACT AND CALCIUM
Calcium: 10.4 mg/dL — ABNORMAL HIGH (ref 8.6–10.2)
PTH: 28 pg/mL (ref 14–64)

## 2020-12-22 LAB — CALCIUM, IONIZED: Calcium, Ion: 5.62 mg/dL — ABNORMAL HIGH (ref 4.8–5.6)

## 2020-12-29 DIAGNOSIS — R269 Unspecified abnormalities of gait and mobility: Secondary | ICD-10-CM | POA: Diagnosis not present

## 2020-12-29 DIAGNOSIS — M542 Cervicalgia: Secondary | ICD-10-CM | POA: Diagnosis not present

## 2020-12-30 ENCOUNTER — Encounter: Payer: Self-pay | Admitting: Family Medicine

## 2020-12-30 NOTE — Telephone Encounter (Signed)
Called patient to see if she was doing better before filling out release to return to work. Left VM.

## 2020-12-30 NOTE — Telephone Encounter (Signed)
Spoke with patient. She is doing much better. Unfortunately is hurting all over now and is following up with Endo tmrw. Will get back to [place] with what they say. Has only had dizziness once since last visit. Working 30 hours a week at this time.

## 2020-12-31 ENCOUNTER — Other Ambulatory Visit: Payer: Self-pay

## 2020-12-31 ENCOUNTER — Ambulatory Visit (INDEPENDENT_AMBULATORY_CARE_PROVIDER_SITE_OTHER): Payer: No Typology Code available for payment source | Admitting: Internal Medicine

## 2020-12-31 ENCOUNTER — Encounter: Payer: Self-pay | Admitting: Internal Medicine

## 2020-12-31 VITALS — BP 144/90 | HR 82 | Ht 61.0 in | Wt 205.5 lb

## 2020-12-31 DIAGNOSIS — E042 Nontoxic multinodular goiter: Secondary | ICD-10-CM | POA: Diagnosis not present

## 2020-12-31 DIAGNOSIS — M542 Cervicalgia: Secondary | ICD-10-CM | POA: Diagnosis not present

## 2020-12-31 DIAGNOSIS — R269 Unspecified abnormalities of gait and mobility: Secondary | ICD-10-CM | POA: Diagnosis not present

## 2020-12-31 LAB — TSH: TSH: 1.02 u[IU]/mL (ref 0.35–4.50)

## 2020-12-31 LAB — MAGNESIUM: Magnesium: 1.9 mg/dL (ref 1.5–2.5)

## 2020-12-31 LAB — VITAMIN D 25 HYDROXY (VIT D DEFICIENCY, FRACTURES): VITD: 16.95 ng/mL — ABNORMAL LOW (ref 30.00–100.00)

## 2020-12-31 LAB — PHOSPHORUS: Phosphorus: 2.5 mg/dL (ref 2.3–4.6)

## 2020-12-31 NOTE — Patient Instructions (Signed)
-   Stay hydrated  - STOP all electrolyte drinks , drink regular water  - Consume 2-3 servings of calcium in your diet
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
## 2021-01-04 DIAGNOSIS — R269 Unspecified abnormalities of gait and mobility: Secondary | ICD-10-CM | POA: Diagnosis not present

## 2021-01-04 DIAGNOSIS — M542 Cervicalgia: Secondary | ICD-10-CM | POA: Diagnosis not present

## 2021-01-07 LAB — COMPLETE METABOLIC PANEL WITH GFR
AG Ratio: 1.1 (calc) (ref 1.0–2.5)
ALT: 17 U/L (ref 6–29)
AST: 12 U/L (ref 10–35)
Albumin: 3.9 g/dL (ref 3.6–5.1)
Alkaline phosphatase (APISO): 66 U/L (ref 31–125)
BUN: 17 mg/dL (ref 7–25)
CO2: 27 mmol/L (ref 20–32)
Calcium: 10.4 mg/dL — ABNORMAL HIGH (ref 8.6–10.2)
Chloride: 100 mmol/L (ref 98–110)
Creat: 0.96 mg/dL (ref 0.50–1.10)
GFR, Est African American: 82 mL/min/{1.73_m2} (ref >=60)
GFR, Est Non African American: 71 mL/min/{1.73_m2} (ref >=60)
Globulin: 3.5 g/dL (calc) (ref 1.9–3.7)
Glucose, Bld: 90 mg/dL (ref 65–99)
Potassium: 3.8 mmol/L (ref 3.5–5.3)
Sodium: 137 mmol/L (ref 135–146)
Total Bilirubin: 0.5 mg/dL (ref 0.2–1.2)
Total Protein: 7.4 g/dL (ref 6.1–8.1)

## 2021-01-07 LAB — TEST AUTHORIZATION

## 2021-01-07 LAB — PTH-RELATED PEPTIDE: PTH-Related Protein (PTH-RP): 9 pg/mL — ABNORMAL LOW (ref 11–20)

## 2021-01-07 LAB — PARATHYROID HORMONE, INTACT (NO CA): PTH: 42 pg/mL (ref 16–77)

## 2021-01-07 LAB — VITAMIN D 1,25 DIHYDROXY
Vitamin D 1, 25 (OH)2 Total: 38 pg/mL (ref 18–72)
Vitamin D2 1, 25 (OH)2: <8 pg/mL
Vitamin D3 1, 25 (OH)2: 38 pg/mL

## 2021-01-07 LAB — VITAMIN A: Vitamin A (Retinoic Acid): 50 ug/dL (ref 38–98)

## 2021-01-07 LAB — CALCIUM, IONIZED: Calcium, Ion: 5.59 mg/dL (ref 4.8–5.6)

## 2021-01-14 DIAGNOSIS — R269 Unspecified abnormalities of gait and mobility: Secondary | ICD-10-CM | POA: Diagnosis not present

## 2021-01-14 DIAGNOSIS — M542 Cervicalgia: Secondary | ICD-10-CM | POA: Diagnosis not present

## 2021-01-15 NOTE — Progress Notes (Signed)
[place] [address] [place] Phone: [phone] Subjective:   I Audrey [name] am serving as a Education administrator for Dr. Hulan Saas.  This visit occurred during the SARS-CoV-2 public health emergency.  Safety protocols were in place, including screening questions prior to the visit, additional usage of staff PPE, and extensive cleaning of exam room while observing appropriate contact time as indicated for disinfecting solutions.   I'm seeing this patient by the request  of:  Lin Landsman, MD  CC: head inury follow up   OEV:[id number]   12/21/2020 Patient still has some vestibular deficiencies noted.  Patient does have some mild anxiety as well with patient returning back to work.  Had difficulty with her first 8-hour day.  I would like to get laboratory work-up again to rule out the hypercalcemia only noted as well as the elevated sedimentation rate.  Patient encouraged to follow-up with the primary care provider we discussed this previously which patient has not done as well as potentially endocrinology if these are abnormal.  Patient has not started the medication that was given to her by neurology for the vestibular migraines.  Discussed with her calling down to see if they can do something else.  It does look like they are going to start at 60 mg.  Patient was under the impression it would be a lower dose.  Patient will increase her work note to 30 hours a week so 6-hour shifts per day for the next 2 weeks and then 40 hours thereafter with a follow-up in 4 weeks.  Hopefully patient will be back to herself.  We did discuss the situational anxiety with her job and potentially talking to behavioral health which patient declined.  Patient will call us if she changes her mind on this.  Once again see patient again in 4 weeks.  Total time reviewing patient's chart as well as talking to patient 33 minutes.  Update 01/18/2021 Audrey [name] is a 45 y.o. female  coming in with complaint of head injury. Patient is back to work full time. Patient states she is still having short term memory issues. Some dizzy spells and pressure around her head at times. States that last weekend for her Daughter's birthday she may have done too much. She believe she may have been over stimulated.         Past Medical History:  Diagnosis Date  . Abortion history   . Anemia   . Asthma   . Fibroid tumor   . Hypertension   . Migraine headache   . Multiple thyroid nodules   . Shingles    Past Surgical History:  Procedure Laterality Date  . CESAREAN SECTION    . COLONOSCOPY, ESOPHAGOGASTRODUODENOSCOPY (EGD) AND ESOPHAGEAL DILATION    . CYSTECTOMY     breast   Social History   Socioeconomic History  . Marital status: Single    Spouse name: Not on file  . Number of children: Not on file  . Years of education: Not on file  . Highest education level: Not on file  Occupational History  . Not on file  Tobacco Use  . Smoking status: Never Smoker  . Smokeless tobacco: Never Used  Vaping Use  . Vaping Use: Never used  Substance and Sexual Activity  . Alcohol use: Yes    Alcohol/week: 0.0 standard drinks    Comment: rare  . Drug use: No  . Sexual activity: Not on file  Other Topics  Concern  . Not on file  Social History Narrative   Lives with children and boyfriend in a one story home.     Works for [employer].     Education: BS   Social Determinants of Health   Financial Resource Strain: Not on file  Food Insecurity: Not on file  Transportation Needs: Not on file  Physical Activity: Not on file  Stress: Not on file  Social Connections: Not on file   Allergies  Allergen Reactions  . Macrolides And Ketolides Other (See Comments)    Made eye swelling worse  . Penicillins Other (See Comments), Itching and Rash    Eye swelling  . Shellfish Allergy Anaphylaxis  . Advair Diskus [Fluticasone-Salmeterol] Nausea Only    Weakness headaches  .  Erythromycin Other (See Comments)    Made eye infection worse   . Lisinopril Other (See Comments) and Swelling    Caused lips to swell  . Nyamyc [Nystatin] Other (See Comments)    Made eye infection worse  . Almond (Diagnostic) Other (See Comments)    Stomach pain  . Diphenhydramine Hcl (Sleep) Other (See Comments)  . Neomycin Swelling  . Asa [Aspirin] Nausea Only  . Latex Rash   Family History  Problem Relation Age of Onset  . Hypertension Father        Living, 29  . High Cholesterol Father   . Hypertension Mother   . Non-Hodgkin's lymphoma Mother        Deceased, 7  . Cancer Maternal Grandfather   . Cancer Paternal Grandfather   . Heart disease Maternal Uncle   . Heart disease Paternal Uncle   . Healthy Brother   . Asthma Son   . Asthma Daughter      Current Outpatient Medications (Cardiovascular):  .  cloNIDine (CATAPRES) 0.2 MG tablet, Take 0.2 mg by mouth daily. Marland Kitchen  triamterene-hydrochlorothiazide (MAXZIDE-25) 37.5-25 MG tablet, Take 1 tablet by mouth daily.  Current Outpatient Medications (Respiratory):  .  albuterol (PROVENTIL HFA;VENTOLIN HFA) 108 (90 BASE) MCG/ACT inhaler, Inhale 2 puffs into the lungs every 6 (six) hours as needed for wheezing or shortness of breath. For shortness of breath  Current Outpatient Medications (Analgesics):  .  acetaminophen (TYLENOL) 500 MG tablet, Take 1,000 mg by mouth every 6 (six) hours as needed for mild pain. .  Atogepant (QULIPTA) 60 MG TABS, Take 60 mg by mouth daily.   Current Outpatient Medications (Other):  .  meclizine (ANTIVERT) 12.5 MG tablet, Take 1 tablet (12.5 mg total) by mouth 2 (two) times daily as needed for dizziness. .  nystatin ointment (MYCOSTATIN), Apply topically.   Reviewed prior external information including notes and imaging from  primary care provider As well as notes that were available from care everywhere and other healthcare systems.  Past medical history, social, surgical and family  history all reviewed in electronic medical record.  No pertanent information unless stated regarding to the chief complaint.   Review of Systems:  No  visual changes, nausea, vomiting, diarrhea, constipation, dizziness, abdominal pain, skin rash, fevers, chills, night sweats, weight loss, swollen lymph nodes, joint swelling, chest pain, shortness of breath, mood changes. POSITIVE muscle aches, body aches, intermittent headaches but improvement but continued difficulty with memory  Objective  Blood pressure (!) 140/92, pulse (!) 45, height 5\' 1"  (1.549 m), weight 206 lb (93.4 kg), SpO2 100 %.   General: No apparent distress alert and oriented x3 mood and affect normal, dressed appropriately.  HEENT: Pupils equal, extraocular movements  intact no goiter noted. Respiratory: Patient's speak in full sentences and does not appear short of breath  Gait normal with good balance and coordination.  MSK:    Patient seems to be doing well.  Very minimal tightness of the neck noted.  No findings on exam today.     Impression and Recommendations:     The above documentation has been reviewed and is accurate and complete Lyndal Pulley, DO

## 2021-01-18 ENCOUNTER — Encounter: Payer: Self-pay | Admitting: Family Medicine

## 2021-01-18 ENCOUNTER — Other Ambulatory Visit: Payer: Self-pay

## 2021-01-18 ENCOUNTER — Ambulatory Visit (INDEPENDENT_AMBULATORY_CARE_PROVIDER_SITE_OTHER): Payer: No Typology Code available for payment source | Admitting: Family Medicine

## 2021-01-18 DIAGNOSIS — E042 Nontoxic multinodular goiter: Secondary | ICD-10-CM | POA: Diagnosis not present

## 2021-01-18 DIAGNOSIS — S060X0D Concussion without loss of consciousness, subsequent encounter: Secondary | ICD-10-CM

## 2021-01-18 NOTE — Assessment & Plan Note (Signed)
Family history of thyroid cancer, following up with endocrinology in 3 months

## 2021-01-18 NOTE — Assessment & Plan Note (Signed)
Endocrinology.  They are monitoring.  Following up again in 3 months

## 2021-01-18 NOTE — Assessment & Plan Note (Signed)
Patient has made significant progress at this time.  Patient is making improvement.  Patient's work-up has been unremarkable on any of the imaging that would be something that would be long-term.  Still having some mild memory issues.  Discussed again about other vitamin supplementations that could be helpful.  Patient has seen neurology for the headaches but now the headaches are almost completely resolved.  I would like to see patient again in 6 to 8 weeks and hopefully the memory in the mild balance issues are near completely resolved and we can fully released that time.

## 2021-01-18 NOTE — Patient Instructions (Addendum)
Good to see you Add choline 500 mg daily to see if this helps with memory Every time I see you things get better Working on memory and dizziness See me again in 6-8 weeks and hopefully we can release you

## 2021-01-19 ENCOUNTER — Encounter: Payer: Self-pay | Admitting: Family Medicine

## 2021-01-19 ENCOUNTER — Other Ambulatory Visit: Payer: Self-pay

## 2021-01-19 DIAGNOSIS — F411 Generalized anxiety disorder: Secondary | ICD-10-CM

## 2021-01-19 DIAGNOSIS — F32A Depression, unspecified: Secondary | ICD-10-CM

## 2021-01-21 DIAGNOSIS — M542 Cervicalgia: Secondary | ICD-10-CM | POA: Diagnosis not present

## 2021-01-21 DIAGNOSIS — R269 Unspecified abnormalities of gait and mobility: Secondary | ICD-10-CM | POA: Diagnosis not present

## 2021-01-28 DIAGNOSIS — M542 Cervicalgia: Secondary | ICD-10-CM | POA: Diagnosis not present

## 2021-01-28 DIAGNOSIS — R269 Unspecified abnormalities of gait and mobility: Secondary | ICD-10-CM | POA: Diagnosis not present

## 2021-02-04 DIAGNOSIS — R051 Acute cough: Secondary | ICD-10-CM | POA: Diagnosis not present

## 2021-02-04 DIAGNOSIS — U099 Post covid-19 condition, unspecified: Secondary | ICD-10-CM | POA: Diagnosis not present

## 2021-02-10 DIAGNOSIS — U071 COVID-19: Secondary | ICD-10-CM | POA: Diagnosis not present

## 2021-02-10 DIAGNOSIS — I10 Essential (primary) hypertension: Secondary | ICD-10-CM | POA: Diagnosis not present

## 2021-02-10 DIAGNOSIS — J4530 Mild persistent asthma, uncomplicated: Secondary | ICD-10-CM | POA: Diagnosis not present

## 2021-02-10 DIAGNOSIS — E663 Overweight: Secondary | ICD-10-CM | POA: Diagnosis not present

## 2021-02-10 DIAGNOSIS — E042 Nontoxic multinodular goiter: Secondary | ICD-10-CM | POA: Diagnosis not present

## 2021-02-19 DIAGNOSIS — Z20822 Contact with and (suspected) exposure to covid-19: Secondary | ICD-10-CM | POA: Diagnosis not present

## 2021-03-04 NOTE — Progress Notes (Signed)
Audrey [name] Phone: [phone] Subjective:   [name], am serving as a scribe for Dr. Hulan Saas. This visit occurred during the SARS-CoV-2 public health emergency.  Safety protocols were in place, including screening questions prior to the visit, additional usage of staff PPE, and extensive cleaning of exam room while observing appropriate contact time as indicated for disinfecting solutions.  I'm seeing this patient by the request  of:  Lin Landsman, MD  CC: Head injury follow-up  SEG:[id number]   01/18/2021 Patient has made significant progress at this time.  Patient is making improvement.  Patient's work-up has been unremarkable on any of the imaging that would be something that would be long-term.  Still having some mild memory issues.  Discussed again about other vitamin supplementations that could be helpful.  Patient has seen neurology for the headaches but now the headaches are almost completely resolved.  I would like to see patient again in 6 to 8 weeks and hopefully the memory in the mild balance issues are near completely resolved and we can fully released that time.   Update 03/05/2021 Erryn NOORAH [name] is a 45 y.o. female coming in with complaint of head injury.  Patient has had significant imaging and please see previous notes noted.  We did review patient's imaging including the MRI previously as well as the CT scans.  Patient has been making significant progress and headaches have nearly been completely resolved.  Patient was continuing to have some balance issues.  Patient was also having some mild anxiety and depression.  Patient states that she continues to have short term memory issues. Unable to recall things unless they are written down. Drove yesterday for the first time. Patient did get headache with first time driving. Has only been driving short distances. Has not been back to physical therapy as  she had COVID. Patient wears glasses at work for screen time. Has more energy on weekends when she is not working.    Previous imaging included MRI of the brain with and without contrast that showed) Capillary telangiectasia.  Patient was seen by neurosurgery who agreed CT angiogram of the neck and head as well as a CT venogram were unremarkable.  At the time of the visit in the emergency department back in December 2021 found to have some mild degenerative disc disease mostly from C4-C7 with very mild spinal canal narrowing at C5-6  Has done a significant amount of physical therapy and has been making significant improvement.  Past Medical History:  Diagnosis Date  . Abortion history   . Anemia   . Asthma   . Fibroid tumor   . Hypertension   . Migraine headache   . Multiple thyroid nodules   . Shingles    Past Surgical History:  Procedure Laterality Date  . CESAREAN SECTION    . COLONOSCOPY, ESOPHAGOGASTRODUODENOSCOPY (EGD) AND ESOPHAGEAL DILATION    . CYSTECTOMY     breast   Social History   Socioeconomic History  . Marital status: Single    Spouse name: Not on file  . Number of children: Not on file  . Years of education: Not on file  . Highest education level: Not on file  Occupational History  . Not on file  Tobacco Use  . Smoking status: Never Smoker  . Smokeless tobacco: Never Used  Vaping Use  . Vaping Use: Never used  Substance and Sexual Activity  . Alcohol use:  Yes    Alcohol/week: 0.0 standard drinks    Comment: rare  . Drug use: No  . Sexual activity: Not on file  Other Topics Concern  . Not on file  Social History Narrative   Lives with children and boyfriend in a one story home.     Works for [employer].     Education: BS   Social Determinants of Health   Financial Resource Strain: Not on file  Food Insecurity: Not on file  Transportation Needs: Not on file  Physical Activity: Not on file  Stress: Not on file  Social Connections: Not on file    Allergies  Allergen Reactions  . Macrolides And Ketolides Other (See Comments)    Made eye swelling worse  . Penicillins Other (See Comments), Itching and Rash    Eye swelling  . Shellfish Allergy Anaphylaxis  . Advair Diskus [Fluticasone-Salmeterol] Nausea Only    Weakness headaches  . Erythromycin Other (See Comments)    Made eye infection worse   . Lisinopril Other (See Comments) and Swelling    Caused lips to swell  . Nyamyc [Nystatin] Other (See Comments)    Made eye infection worse  . Almond (Diagnostic) Other (See Comments)    Stomach pain  . Diphenhydramine Hcl (Sleep) Other (See Comments)  . Neomycin Swelling  . Asa [Aspirin] Nausea Only  . Latex Rash   Family History  Problem Relation Age of Onset  . Hypertension Father        Living, 41  . High Cholesterol Father   . Hypertension Mother   . Non-Hodgkin's lymphoma Mother        Deceased, 21  . Cancer Maternal Grandfather   . Cancer Paternal Grandfather   . Heart disease Maternal Uncle   . Heart disease Paternal Uncle   . Healthy Brother   . Asthma Son   . Asthma Daughter      Current Outpatient Medications (Cardiovascular):  .  cloNIDine (CATAPRES) 0.2 MG tablet, Take 0.2 mg by mouth daily. Marland Kitchen  triamterene-hydrochlorothiazide (MAXZIDE-25) 37.5-25 MG tablet, Take 1 tablet by mouth daily.  Current Outpatient Medications (Respiratory):  .  albuterol (PROVENTIL HFA;VENTOLIN HFA) 108 (90 BASE) MCG/ACT inhaler, Inhale 2 puffs into the lungs every 6 (six) hours as needed for wheezing or shortness of breath. For shortness of breath  Current Outpatient Medications (Analgesics):  .  acetaminophen (TYLENOL) 500 MG tablet, Take 1,000 mg by mouth every 6 (six) hours as needed for mild pain. .  Atogepant (QULIPTA) 60 MG TABS, Take 60 mg by mouth daily.   Current Outpatient Medications (Other):  .  meclizine (ANTIVERT) 12.5 MG tablet, Take 1 tablet (12.5 mg total) by mouth 2 (two) times daily as needed for  dizziness. .  nystatin ointment (MYCOSTATIN), Apply topically.   Reviewed prior external information including notes and imaging from  primary care provider As well as notes that were available from care everywhere and other healthcare systems.  Past medical history, social, surgical and family history all reviewed in electronic medical record.  No pertanent information unless stated regarding to the chief complaint.   Review of Systems:  No  visual changes, nausea, vomiting, diarrhea, constipation, dizziness, abdominal pain, skin rash, fevers, chills, night sweats, weight loss, swollen lymph nodes, body aches, joint swelling, chest pain, shortness of breath, mood changes. POSITIVE headache  Objective  Blood pressure 122/84, pulse 69, height 5\' 1"  (1.549 m), weight 204 lb (92.5 kg), SpO2 99 %.   General: No apparent distress  alert and oriented x3 mood and affect normal, dressed appropriately.  Patient is able to carry on a conversation very well. HEENT: Pupils equal, extraocular movements intact patient has no signs of nystagmus.  Goiter still noted Respiratory: Patient's speak in full sentences and does not appear short of breath  Cardiovascular: No lower extremity edema, non tender, no erythema  Gait normal with good balance and coordination.  MSK:  Non tender with full range of motion and good stability and symmetric strength and tone of shoulders, elbows, wrist, hip, knee and ankles bilaterally.     Impression and Recommendations:     The above documentation has been reviewed and is accurate and complete Lyndal Pulley, DO

## 2021-03-05 ENCOUNTER — Other Ambulatory Visit: Payer: Self-pay

## 2021-03-05 ENCOUNTER — Encounter: Payer: Self-pay | Admitting: Family Medicine

## 2021-03-05 ENCOUNTER — Ambulatory Visit (INDEPENDENT_AMBULATORY_CARE_PROVIDER_SITE_OTHER): Payer: No Typology Code available for payment source | Admitting: Family Medicine

## 2021-03-05 DIAGNOSIS — S060X0D Concussion without loss of consciousness, subsequent encounter: Secondary | ICD-10-CM

## 2021-03-05 NOTE — Patient Instructions (Signed)
Good to see you Increase driving you will become more comfortable and it will increase autonomy Consider choline 500 mg daily to see if it helps with short term memory Hold on PT and do exercises on your own Glad you are feeling like yourself and we are working on memory See me again in 8 weeks to make sure you are better

## 2021-03-05 NOTE — Assessment & Plan Note (Addendum)
I believe the patient's infection is Completely resolved at this time.  Continuing to have some difficulty with short-term memory.  Patient is not on any medications that would likely contribute to this.  Still difficult to assess if this is from the possible head injury but once again it did start afterwards.  Patient is doing much better where patient is having improvement with all her other symptoms including being able to drive again and is building up her confidence.  Patient has been able to work just does get some headaches from time to time.  We did discuss with her about the possibility of some other over-the-counter medicines but patient would like to avoid prescription medications too much.  Patient continues to make improvement but may be not back to her full maximum medical improvement yet but is very very close.  At this point I would like patient to continue to increase her activities including driving and working out and see how patient responds.  Patient did have her daughter in the room so did not discuss the behavioral health today.  Patient will follow up with me again 2 months and hopefully will be able to fully release patient at that time total time today reviewing patient's imaging including MRI of the brain, as well as CT scans as well as discussing with patient 33 minutes.

## 2021-03-09 DIAGNOSIS — M542 Cervicalgia: Secondary | ICD-10-CM | POA: Diagnosis not present

## 2021-03-09 DIAGNOSIS — R269 Unspecified abnormalities of gait and mobility: Secondary | ICD-10-CM | POA: Diagnosis not present

## 2021-03-18 DIAGNOSIS — I10 Essential (primary) hypertension: Secondary | ICD-10-CM | POA: Diagnosis not present

## 2021-03-18 DIAGNOSIS — M25571 Pain in right ankle and joints of right foot: Secondary | ICD-10-CM | POA: Diagnosis not present

## 2021-03-18 DIAGNOSIS — E079 Disorder of thyroid, unspecified: Secondary | ICD-10-CM | POA: Diagnosis not present

## 2021-03-18 DIAGNOSIS — M25551 Pain in right hip: Secondary | ICD-10-CM | POA: Diagnosis not present

## 2021-03-18 DIAGNOSIS — E569 Vitamin deficiency, unspecified: Secondary | ICD-10-CM | POA: Diagnosis not present

## 2021-03-18 DIAGNOSIS — R600 Localized edema: Secondary | ICD-10-CM | POA: Diagnosis not present

## 2021-04-01 DIAGNOSIS — J3089 Other allergic rhinitis: Secondary | ICD-10-CM | POA: Diagnosis not present

## 2021-04-01 DIAGNOSIS — J301 Allergic rhinitis due to pollen: Secondary | ICD-10-CM | POA: Diagnosis not present

## 2021-04-01 DIAGNOSIS — T781XXD Other adverse food reactions, not elsewhere classified, subsequent encounter: Secondary | ICD-10-CM | POA: Diagnosis not present

## 2021-04-01 DIAGNOSIS — J4540 Moderate persistent asthma, uncomplicated: Secondary | ICD-10-CM | POA: Diagnosis not present
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
## 2021-04-02 ENCOUNTER — Other Ambulatory Visit: Payer: Self-pay

## 2021-04-02 ENCOUNTER — Ambulatory Visit (INDEPENDENT_AMBULATORY_CARE_PROVIDER_SITE_OTHER): Payer: 59 | Admitting: Internal Medicine

## 2021-04-02 ENCOUNTER — Encounter: Payer: Self-pay | Admitting: Internal Medicine

## 2021-04-02 ENCOUNTER — Ambulatory Visit: Payer: No Typology Code available for payment source | Admitting: Internal Medicine

## 2021-04-02 VITALS — BP 132/80 | HR 74 | Ht 61.0 in | Wt 203.0 lb

## 2021-04-02 DIAGNOSIS — E559 Vitamin D deficiency, unspecified: Secondary | ICD-10-CM

## 2021-04-02 DIAGNOSIS — E042 Nontoxic multinodular goiter: Secondary | ICD-10-CM

## 2021-04-02 DIAGNOSIS — E213 Hyperparathyroidism, unspecified: Secondary | ICD-10-CM | POA: Diagnosis not present

## 2021-04-02 HISTORY — DX: Vitamin D deficiency, unspecified: E55.9

## 2021-04-02 LAB — BASIC METABOLIC PANEL
BUN: 20 mg/dL (ref 6–23)
CO2: 27 mEq/L (ref 19–32)
Calcium: 10.6 mg/dL — ABNORMAL HIGH (ref 8.4–10.5)
Chloride: 99 mEq/L (ref 96–112)
Creatinine, Ser: 0.95 mg/dL (ref 0.40–1.20)
GFR: 71.53 mL/min (ref >60.00)
Glucose, Bld: 90 mg/dL (ref 70–99)
Potassium: 3.6 mEq/L (ref 3.5–5.1)
Sodium: 135 mEq/L (ref 135–145)

## 2021-04-02 LAB — TSH: TSH: 1.25 u[IU]/mL (ref 0.35–4.50)

## 2021-04-02 LAB — ALBUMIN: Albumin: 4.1 g/dL (ref 3.5–5.2)

## 2021-04-02 NOTE — Progress Notes (Signed)
 Name: Audrey [name]  MRN/ DOB: [id number], 12/15/1975    Age/ Sex: 45 y.o., female     PCP: Reese, Betti, MD   Reason for [name] Evaluation: MNG     Initial [name] Visit: 08/28/2020    PATIENT IDENTIFIER: Audrey [name] is a 45 y.o., female with a past medical history of Asthma and MNG. She has followed with [name] since 08/28/2020 for consultative assistance with management of her MNG.   HISTORICAL SUMMARY:   She has been diagnosed with thyroid nodules in 2019 , this was noted again during carotid ultrasound, which prompted a thyroid  ultrasound revealing multiple thyroid nodules but none met criteria for a biopsy.   Endoscopy 2019 showing acid reflux , benign plyps, stomach irritation  SUBJECTIVE:     Today (04/02/2021):  Audrey [name] is here for hypercalcemia that was noted through her sports medicine specialist. She was noted with a corrected  serum calcium of 10.9 mg/dL in 10/2020 . Repeat serum calcium confirmed elevated by 12/2020 at 10.4 and elevated ionized calcium 5.62 mg/dL. PTH on low end of normal at 28 pg/mL    She continues with polydipsia  No renal stones   Stopped mineral water   Has been constipation the last few weeks   Had stopped Vitamin D 2000 iu  for a while but recently restarted after she went to urgent care and was found to have a low vitamin D    Denies local neck pain or swelling, feels more fat deposit  No vomiting or heart burn    Has not seen GI yet  Has been following with Dr. Jaffe and Dr. Smith        HISTORY:  Past Medical History:  Past Medical History:  Diagnosis Date  . Abortion history   . Anemia   . Asthma   . Fibroid tumor   . Hypertension   . Migraine headache   . Multiple thyroid nodules   . Shingles    Past Surgical History:  Past Surgical History:  Procedure Laterality Date  . CESAREAN SECTION    . COLONOSCOPY, ESOPHAGOGASTRODUODENOSCOPY (EGD) AND ESOPHAGEAL  DILATION    . CYSTECTOMY     breast   Social History:  reports that she has never smoked. She has never used smokeless tobacco. She reports current alcohol use. She reports that she does not use drugs. Family History:  Family History  Problem Relation Age of Onset  . Hypertension Father        Living, 70  . High Cholesterol Father   . Hypertension Mother   . Non-Hodgkin's lymphoma Mother        Deceased, 45  . Cancer Maternal Grandfather   . Cancer Paternal Grandfather   . Heart disease Maternal Uncle   . Heart disease Paternal Uncle   . Healthy Brother   . Asthma Son   . Asthma Daughter      HOME MEDICATIONS: Allergies as of 04/02/2021       Reactions   Macrolides And Ketolides Other (See Comments)   Made eye swelling worse   Penicillins Other (See Comments), Itching, Rash   Eye swelling   Shellfish Allergy Anaphylaxis   Advair Diskus [fluticasone-salmeterol] Nausea Only   Weakness headaches   Erythromycin Other (See Comments)   Made eye infection worse   Lisinopril Other (See Comments), Swelling   Caused lips to swell   Nyamyc [nystatin] Other (See Comments)   Made eye infection worse     Almond (diagnostic) Other (See Comments)   Stomach pain   Diphenhydramine Hcl (sleep) Other (See Comments)   Neomycin Swelling   Asa [aspirin] Nausea Only   Latex Rash        Medication List        Accurate as of April 02, 2021 12:01 PM. If you have any questions, ask your nurse or doctor.          acetaminophen 500 MG tablet Commonly known as: TYLENOL Take 1,000 mg by mouth every 6 (six) hours as needed for mild pain.   albuterol 108 (90 Base) MCG/ACT inhaler Commonly known as: VENTOLIN HFA Inhale 2 puffs into the lungs every 6 (six) hours as needed for wheezing or shortness of breath. For shortness of breath   cloNIDine 0.2 MG tablet Commonly known as: CATAPRES Take 0.2 mg by mouth daily.   meclizine 12.5 MG tablet Commonly known as: ANTIVERT Take 1 tablet  (12.5 mg total) by mouth 2 (two) times daily as needed for dizziness.   nystatin ointment Commonly known as: MYCOSTATIN Apply topically.   Qulipta 60 MG Tabs Generic drug: Atogepant Take 60 mg by mouth daily.   triamterene-hydrochlorothiazide 37.5-25 MG tablet Commonly known as: MAXZIDE-25 Take 1 tablet by mouth daily.          OBJECTIVE:   PHYSICAL EXAM: VS: BP 132/80   Pulse 74   Ht 5' 1" (1.549 m)   Wt 203 lb (92.1 kg)   SpO2 99%   BMI 38.36 kg/m    EXAM: General: Pt appears well and is in NAD  Neck: General: Supple without adenopathy. Thyroid: Thyroid size normal.  No goiter or nodules appreciated.   Lungs: Clear with good BS bilat with no rales, rhonchi, or wheezes  Heart: Auscultation: RRR.  Abdomen: Normoactive bowel sounds, soft, nontender, without masses or organomegaly palpable  Extremities:  BL LE: No pretibial edema normal ROM and strength.  Mental Status: Judgment, insight: Intact Orientation: Oriented to time, place, and person Memory: Intact for recent and remote events Mood and affect: No depression, anxiety, or agitation     DATA REVIEWED:  Results for Innis, Cyncere O (MRN [id number]) as of 04/05/2021 10:28  Ref. Range 04/02/2021 12:20  Sodium Latest Ref Range: 135 - 145 mEq/L 135  Potassium Latest Ref Range: 3.5 - 5.1 mEq/L 3.6  Chloride Latest Ref Range: 96 - 112 mEq/L 99  CO2 Latest Ref Range: 19 - 32 mEq/L 27  Glucose Latest Ref Range: 70 - 99 mg/dL 90  BUN Latest Ref Range: 6 - 23 mg/dL 20  Creatinine Latest Ref Range: 0.40 - 1.20 mg/dL 0.95  Calcium Latest Ref Range: 8.4 - 10.5 mg/dL 10.6 (H)  Calcium Ionized Latest Ref Range: 4.8 - 5.6 mg/dL 5.65 (H)  Albumin Latest Ref Range: 3.5 - 5.2 g/dL 4.1  GFR Latest Ref Range: >60.00 mL/min 71.53  TSH Latest Ref Range: 0.35 - 4.50 uIU/mL 1.25      Thyroid Ultrasound 06/19/2020  There is an approximately 1.1 x 0.8 x 0.4 cm anechoic cyst within the superior pole the right lobe of the  thyroid (labeled 1), which does not meet criteria to recommend percutaneous sampling or continued dedicated follow-up.   There is an approximately 1.3 x 1.1 x 0.8 cm minimally complex cyst within the inferior, posterior aspect the right lobe of the thyroid (labeled 2)., correlating with the nodule seen on preceding carotid Doppler ultrasound. This minimally complex cyst does not meet criteria to recommend percutaneous sampling or continued   dedicated follow-up.   There are several scattered additional punctate (sub 4 mm) anechoic cysts and hypoechoic nodules scattered within the remainder of the right lobe of the thyroid, none of which meet imaging criteria to recommend percutaneous sampling or continued dedicated follow-up.   _________________________________________________________   There is an approximately 0.9 x 0.8 x 0.3 cm anechoic cyst within the mid aspect the left lobe of the thyroid (labeled 3), which contains an internal echogenic foci ring down artifact compatible with benign colloid. This benign colloid containing cyst does not meet criteria to recommend percutaneous sampling or continued dedicated follow-up   There is an approximately 0.7 x 0.7 x 0.6 cm ill-defined hypoechoic nodule/pseudonodule within the inferior pole of the left lobe of the thyroid (labeled 4) which does not meet imaging criteria to recommend percutaneous sampling or continued dedicated follow-up.   There are scattered additional punctate (sub 4 mm) anechoic cysts and hypoechoic nodules with the remainder of the left lobe of the thyroid, none of which meet imaging criteria to recommend percutaneous sampling or continued dedicated follow-up.   IMPRESSION: 1. Findings suggestive of multinodular goiter. 2. None of the discretely measured thyroid nodules, including the incidentally noted minimally complex cyst on preceding carotid Doppler ultrasound, meet imaging criteria to recommend  percutaneous sampling or continued dedicated follow-up.  ASSESSMENT / PLAN / RECOMMENDATIONS:    1. Hypercalcemia :   -Patient continues with hypercalcemia, levels have been mild and steady -Unable to proceed with 24-hour urine collection for calcium excretion, I have explained to the patient the importance of normalizing vitamin D levels prior to this step, as low vitamin D levels will skew her urinary calcium excretion levels.   -We will proceed with bone density   Recommendations:  - Stay hydrated with regular water  - Consume 2-3 servings of calcium in the diet       2.Multinodular goiter:    -Patient with occasional neck symptoms -There is no nodules appreciated on the exam today -Her TSH has been normal  3. Vitamin D Deficiency :  -She admits to imperfect adherence to vitamin D until recently when she went to the urgent care and was told that vitamin D remains low, she is now taking vitamin D3 2000 IU daily.  She feels better since she has been on it   Continue Vitamin D 3 2000 iu daily    F/U in 3 months   Signed electronically by: Mack Guise, MD  [name]  [facility] [address] [place], [place] [address] Phone: [phone] FAX: [phone]      CC: Lin Landsman, [place] [address] Phone: [phone]  Fax: [phone]   Return to [name] clinic as below: Future Appointments  Date Time Provider [place]  04/05/2021  8:30 AM Pieter Partridge, DO [facility] None  05/04/2021  4:00 PM Lyndal Pulley, DO [facility] None  06/09/2021 10:50 AM Jah Alarid, Melanie Crazier, MD [facility] None  09/03/2021 10:10 AM Bilan Tedesco, Melanie Crazier, MD [facility] None
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
## 2021-04-04 ENCOUNTER — Emergency Department (HOSPITAL_BASED_OUTPATIENT_CLINIC_OR_DEPARTMENT_OTHER)
Admission: EM | Admit: 2021-04-04 | Discharge: 2021-04-05 | Disposition: A | Discharge status: Home / Self Care | Payer: 59 | Attending: Emergency Medicine | Admitting: Emergency Medicine

## 2021-04-04 ENCOUNTER — Emergency Department (HOSPITAL_BASED_OUTPATIENT_CLINIC_OR_DEPARTMENT_OTHER): Payer: 59

## 2021-04-04 ENCOUNTER — Encounter (HOSPITAL_BASED_OUTPATIENT_CLINIC_OR_DEPARTMENT_OTHER): Payer: Self-pay

## 2021-04-04 ENCOUNTER — Other Ambulatory Visit: Payer: Self-pay

## 2021-04-04 DIAGNOSIS — R42 Dizziness and giddiness: Secondary | ICD-10-CM | POA: Diagnosis not present

## 2021-04-04 DIAGNOSIS — Z79899 Other long term (current) drug therapy: Secondary | ICD-10-CM | POA: Diagnosis not present

## 2021-04-04 DIAGNOSIS — M546 Pain in thoracic spine: Secondary | ICD-10-CM

## 2021-04-04 DIAGNOSIS — J9811 Atelectasis: Secondary | ICD-10-CM | POA: Diagnosis not present

## 2021-04-04 DIAGNOSIS — J45909 Unspecified asthma, uncomplicated: Secondary | ICD-10-CM | POA: Diagnosis not present

## 2021-04-04 DIAGNOSIS — M542 Cervicalgia: Secondary | ICD-10-CM | POA: Insufficient documentation

## 2021-04-04 DIAGNOSIS — I10 Essential (primary) hypertension: Secondary | ICD-10-CM | POA: Diagnosis not present

## 2021-04-04 DIAGNOSIS — Z91040 Latex allergy status: Secondary | ICD-10-CM | POA: Diagnosis not present

## 2021-04-04 LAB — COMPREHENSIVE METABOLIC PANEL
ALT: 14 U/L (ref 0–44)
AST: 13 U/L — ABNORMAL LOW (ref 15–41)
Albumin: 3.8 g/dL (ref 3.5–5.0)
Alkaline Phosphatase: 54 U/L (ref 38–126)
Anion gap: 7 (ref 5–15)
BUN: 20 mg/dL (ref 6–20)
CO2: 27 mmol/L (ref 22–32)
Calcium: 10.3 mg/dL (ref 8.9–10.3)
Chloride: 102 mmol/L (ref 98–111)
Creatinine, Ser: 0.99 mg/dL (ref 0.44–1.00)
GFR, Estimated: >60 mL/min (ref >60)
Glucose, Bld: 99 mg/dL (ref 70–99)
Potassium: 3.4 mmol/L — ABNORMAL LOW (ref 3.5–5.1)
Sodium: 136 mmol/L (ref 135–145)
Total Bilirubin: 0.4 mg/dL (ref 0.3–1.2)
Total Protein: 7.6 g/dL (ref 6.5–8.1)

## 2021-04-04 LAB — URINALYSIS, ROUTINE W REFLEX MICROSCOPIC
Bilirubin Urine: NEGATIVE
Glucose, UA: NEGATIVE mg/dL
Hgb urine dipstick: NEGATIVE
Ketones, ur: NEGATIVE mg/dL
Nitrite: NEGATIVE
Protein, ur: NEGATIVE mg/dL
Specific Gravity, Urine: 1.023 (ref 1.005–1.030)
pH: 6 (ref 5.0–8.0)

## 2021-04-04 LAB — CBC WITH DIFFERENTIAL/PLATELET
Abs Immature Granulocytes: 0.01 10*3/uL (ref 0.00–0.07)
Basophils Absolute: 0 10*3/uL (ref 0.0–0.1)
Basophils Relative: 0 %
Eosinophils Absolute: 0.1 10*3/uL (ref 0.0–0.5)
Eosinophils Relative: 1 %
HCT: 35.9 % — ABNORMAL LOW (ref 36.0–46.0)
Hemoglobin: 11.7 g/dL — ABNORMAL LOW (ref 12.0–15.0)
Immature Granulocytes: 0 %
Lymphocytes Relative: 22 %
Lymphs Abs: 2 10*3/uL (ref 0.7–4.0)
MCH: 28.2 pg (ref 26.0–34.0)
MCHC: 32.6 g/dL (ref 30.0–36.0)
MCV: 86.5 fL (ref 80.0–100.0)
Monocytes Absolute: 0.9 10*3/uL (ref 0.1–1.0)
Monocytes Relative: 10 %
Neutro Abs: 6 10*3/uL (ref 1.7–7.7)
Neutrophils Relative %: 67 %
Platelets: 306 10*3/uL (ref 150–400)
RBC: 4.15 MIL/uL (ref 3.87–5.11)
RDW: 14.4 % (ref 11.5–15.5)
WBC: 8.9 10*3/uL (ref 4.0–10.5)
nRBC: 0 % (ref 0.0–0.2)

## 2021-04-04 LAB — TROPONIN I (HIGH SENSITIVITY): Troponin I (High Sensitivity): <2 ng/L (ref <18)

## 2021-04-04 MED ORDER — ONDANSETRON HCL 4 MG/2ML IJ SOLN
4.0000 mg | Freq: Once | INTRAMUSCULAR | Status: AC
  Administered 2021-04-04: 4 mg via INTRAVENOUS
  Filled 2021-04-04: qty 2

## 2021-04-04 MED ORDER — IOHEXOL 350 MG/ML SOLN
75.0000 mL | Freq: Once | INTRAVENOUS | Status: AC | PRN
  Administered 2021-04-04: 75 mL via INTRAVENOUS

## 2021-04-04 MED ORDER — SODIUM CHLORIDE 0.9 % IV BOLUS
1000.0000 mL | Freq: Once | INTRAVENOUS | Status: AC
  Administered 2021-04-04: 1000 mL via INTRAVENOUS

## 2021-04-04 NOTE — ED Provider Notes (Signed)
[facility]ERGENC[facility] DEPT Provider Note   CSN: [id number] Arrival date & time: 04/04/21  2042     History Chief Complaint  Patient presents with   Neck Pain   Dizziness    Audrey [name] is a 45 y.o. female.  HPI     This 45 year old female with a history of hypertension, asthma, shingles who presents with neck pain, back pain, dizziness.  Patient reports that she had skin prick allergy testing on her back and arm on Thursday.  Since that time she has had some upper back discomfort.  She woke up yesterday and afternoon with right-sided neck pain.  She states that is progressively gotten worse and has not improved with Tylenol.  There is not any association with movement.  She states today she began to have episodes of lightheadedness.  Initially she described as feeling off balance but then also describes some room spinning component.  She took off her glasses and closed her eyes with some relief.  No nausea or vomiting.  No vision changes, weakness, numbness, tingling, strokelike symptoms.  She denies chest pain.  Past Medical History:  Diagnosis Date   Abortion history    Anemia    Asthma    Fibroid tumor    Hypertension    Migraine headache    Multiple thyroid nodules    Shingles     Patient Active Problem List   Diagnosis Date Noted   Vitamin D deficiency 04/02/2021   Hypercalcemia 12/21/2020   Elevated sedimentation rate 10/15/2020   Concussion with no loss of consciousness 09/28/2020   Multinodular goiter 08/28/2020   Pleurisy 06/16/2016   Chest pain 06/15/2016   Paresthesia 03/18/2015   Acute left-sided weakness 05/13/2013   UNSPECIFIED VITAMIN D DEFICIENCY 06/06/2008   VIRAL INFECTION 08/31/2007   ALLERGIC RHINITIS 08/31/2007   Asthma 08/31/2007    Past Surgical History:  Procedure Laterality Date   CESAREAN SECTION     COLONOSCOPY, ESOPHAGOGASTRODUODENOSCOPY (EGD) AND ESOPHAGEAL DILATION     CYSTECTOMY     breast     OB History   No  obstetric history on file.     Family History  Problem Relation Age of Onset   Hypertension Father        Living, 31   High Cholesterol Father    Hypertension Mother    Non-Hodgkin's lymphoma Mother        Deceased, 35   Cancer Maternal Grandfather    Cancer Paternal Grandfather    Heart disease Maternal Uncle    Heart disease Paternal Uncle    Healthy Brother    Asthma Son    Asthma Daughter     Social History   Tobacco Use   Smoking status: Never   Smokeless tobacco: Never  Vaping Use   Vaping Use: Never used  Substance Use Topics   Alcohol use: Yes    Alcohol/week: 0.0 standard drinks    Comment: rare   Drug use: No    Home Medications Prior to Admission medications   Medication Sig Start Date End Date Taking? Authorizing Provider  acetaminophen (TYLENOL) 500 MG tablet Take 1,000 mg by mouth every 6 (six) hours as needed for mild pain.    [provider]  albuterol (PROVENTIL HFA;VENTOLIN HFA) 108 (90 BASE) MCG/ACT inhaler Inhale 2 puffs into the lungs every 6 (six) hours as needed for wheezing or shortness of breath. For shortness of breath    [provider]  Atogepant (QULIPTA) 60 MG TABS Take  60 mg by mouth daily. 12/03/20   Pieter Partridge, DO  cloNIDine (CATAPRES) 0.2 MG tablet Take 0.2 mg by mouth daily. 04/30/20   [provider]  meclizine (ANTIVERT) 12.5 MG tablet Take 1 tablet (12.5 mg total) by mouth 2 (two) times daily as needed for dizziness. 10/08/20   Lyndal Pulley, DO  nystatin ointment (MYCOSTATIN) Apply topically. 09/21/20   [provider]  triamterene-hydrochlorothiazide (MAXZIDE-25) 37.5-25 MG tablet Take 1 tablet by mouth daily. 09/14/20   [provider]  omeprazole (PRILOSEC) 20 MG capsule Take 1 capsule (20 mg total) by mouth daily. 06/11/18 08/02/19  Kirichenko, Lahoma Rocker, PA-C  sucralfate (CARAFATE) 1 g tablet Take 1 tablet (1 g total) by mouth 4 (four) times daily -  with meals and at bedtime.  06/11/18 08/02/19  Kirichenko, Lahoma Rocker, PA-C    Allergies    Macrolides and ketolides, Penicillins, Shellfish allergy, Advair diskus [fluticasone-salmeterol], Erythromycin, Lisinopril, Nyamyc [nystatin], Almond (diagnostic), Diphenhydramine hcl (sleep), Neomycin, Asa [aspirin], and Latex  Review of Systems   Review of Systems  Constitutional:  Negative for fever.  Respiratory:  Negative for shortness of breath.   Cardiovascular:  Negative for chest pain.  Gastrointestinal:  Negative for abdominal pain, nausea and vomiting.  Musculoskeletal:  Positive for back pain and neck pain.  Neurological:  Positive for dizziness and light-headedness. Negative for speech difficulty, weakness and numbness.  All other systems reviewed and are negative.  Physical Exam Updated Vital Signs BP 107/71   Pulse 70   Temp 98.3 F (36.8 C) (Oral)   Resp 18   Ht 1.549 m (5\' 1" )   Wt 92 kg   LMP 03/21/2021   SpO2 100%   BMI 38.32 kg/m   Physical Exam Vitals and nursing note reviewed.  Constitutional:      Appearance: She is well-developed. She is obese. She is not ill-appearing.  HENT:     Head: Normocephalic and atraumatic.     Nose: Nose normal.     Mouth/Throat:     Mouth: Mucous membranes are moist.  Eyes:     Pupils: Pupils are equal, round, and reactive to light.  Neck:     Comments: Normal range of motion, no meningismus, tenderness palpation right neck, no mass Cardiovascular:     Rate and Rhythm: Normal rate and regular rhythm.     Heart sounds: Normal heart sounds.  Pulmonary:     Effort: Pulmonary effort is normal. No respiratory distress.     Breath sounds: No wheezing.  Abdominal:     General: Bowel sounds are normal.     Palpations: Abdomen is soft.  Musculoskeletal:     Cervical back: Normal range of motion and neck supple.     Comments: Tenderness palpation over the left scapula, no overlying skin changes, no rash, no erythema  Skin:    General: Skin is warm and dry.   Neurological:     Mental Status: She is alert and oriented to person, place, and time.     Comments: 5 out of 5 strength in all 4 extremities, no dysmetria to finger-nose-finger, cranial nerves II through XII intact  Psychiatric:        Mood and Affect: Mood normal.    ED Results / Procedures / Treatments   Labs (all labs ordered are listed, but only abnormal results are displayed) Labs Reviewed  COMPREHENSIVE METABOLIC PANEL - Abnormal; Notable for the following components:      Result Value   Potassium 3.4 (*)  AST 13 (*)    All other components within normal limits  CBC WITH DIFFERENTIAL/PLATELET - Abnormal; Notable for the following components:   Hemoglobin 11.7 (*)    HCT 35.9 (*)    All other components within normal limits  URINALYSIS, ROUTINE W REFLEX MICROSCOPIC - Abnormal; Notable for the following components:   Leukocytes,Ua TRACE (*)    All other components within normal limits  TROPONIN I (HIGH SENSITIVITY)  TROPONIN I (HIGH SENSITIVITY)    EKG EKG Interpretation  Date/Time:  Sunday April 04 2021 21:34:09 EDT Ventricular Rate:  84 PR Interval:  168 QRS Duration: 68 QT Interval:  338 QTC Calculation: 399 R Axis:   50 Text Interpretation: Normal sinus rhythm Nonspecific T wave abnormality Abnormal ECG Confirmed by Thayer Jew [phone]) on 04/04/2021 11:20:38 PM  Radiology CT Angio Head W or Wo Contrast  Result Date: 04/05/2021 CLINICAL DATA:  Dizziness and neck pain EXAM: CT ANGIOGRAPHY HEAD AND NECK TECHNIQUE: Multidetector CT imaging of the head and neck was performed using the standard protocol during bolus administration of intravenous contrast. Multiplanar CT image reconstructions and MIPs were obtained to evaluate the vascular anatomy. Carotid stenosis measurements (when applicable) are obtained utilizing NASCET criteria, using the distal internal carotid diameter as the denominator. CONTRAST:  66mL OMNIPAQUE IOHEXOL 350 MG/ML SOLN COMPARISON:   10/11/2020 FINDINGS: CT HEAD FINDINGS Brain: There is no mass, hemorrhage or extra-axial collection. The size and configuration of the ventricles and extra-axial CSF spaces are normal. There is no acute or chronic infarction. The brain parenchyma is normal. Skull: The visualized skull base, calvarium and extracranial soft tissues are normal. Sinuses/Orbits: No fluid levels or advanced mucosal thickening of the visualized paranasal sinuses. No mastoid or middle ear effusion. The orbits are normal. CTA NECK FINDINGS SKELETON: There is no bony spinal canal stenosis. No lytic or blastic lesion. OTHER NECK: Normal pharynx, larynx and major salivary glands. No cervical lymphadenopathy. Unremarkable thyroid gland. UPPER CHEST: No pneumothorax or pleural effusion. No nodules or masses. AORTIC ARCH: There is no calcific atherosclerosis of the aortic arch. There is no aneurysm, dissection or hemodynamically significant stenosis of the visualized portion of the aorta. Conventional 3 vessel aortic branching pattern. The visualized proximal subclavian arteries are widely patent. RIGHT CAROTID SYSTEM: Normal without aneurysm, dissection or stenosis. LEFT CAROTID SYSTEM: Normal without aneurysm, dissection or stenosis. VERTEBRAL ARTERIES: Left dominant configuration. Both origins are clearly patent. There is no dissection, occlusion or flow-limiting stenosis to the skull base (V1-V3 segments). CTA HEAD FINDINGS POSTERIOR CIRCULATION: --Vertebral arteries: Normal V4 segments. --Inferior cerebellar arteries: Normal. --Basilar artery: Normal. --Superior cerebellar arteries: Normal. --Posterior cerebral arteries (PCA): Normal. ANTERIOR CIRCULATION: --Intracranial internal carotid arteries: Normal. --Anterior cerebral arteries (ACA): Normal. Both A1 segments are present. Patent anterior communicating artery (a-comm). --Middle cerebral arteries (MCA): Normal. VENOUS SINUSES: As permitted by contrast timing, patent. ANATOMIC VARIANTS:  None Review of the MIP images confirms the above findings. IMPRESSION: Normal CTA of the head and neck. Electronically Signed   By: Ulyses Jarred M.D.   On: 04/05/2021 00:28   CT Angio Neck W and/or Wo Contrast  Result Date: 04/05/2021 CLINICAL DATA:  Dizziness and neck pain EXAM: CT ANGIOGRAPHY HEAD AND NECK TECHNIQUE: Multidetector CT imaging of the head and neck was performed using the standard protocol during bolus administration of intravenous contrast. Multiplanar CT image reconstructions and MIPs were obtained to evaluate the vascular anatomy. Carotid stenosis measurements (when applicable) are obtained utilizing NASCET criteria, using the distal internal carotid diameter as  the denominator. CONTRAST:  68mL OMNIPAQUE IOHEXOL 350 MG/ML SOLN COMPARISON:  10/11/2020 FINDINGS: CT HEAD FINDINGS Brain: There is no mass, hemorrhage or extra-axial collection. The size and configuration of the ventricles and extra-axial CSF spaces are normal. There is no acute or chronic infarction. The brain parenchyma is normal. Skull: The visualized skull base, calvarium and extracranial soft tissues are normal. Sinuses/Orbits: No fluid levels or advanced mucosal thickening of the visualized paranasal sinuses. No mastoid or middle ear effusion. The orbits are normal. CTA NECK FINDINGS SKELETON: There is no bony spinal canal stenosis. No lytic or blastic lesion. OTHER NECK: Normal pharynx, larynx and major salivary glands. No cervical lymphadenopathy. Unremarkable thyroid gland. UPPER CHEST: No pneumothorax or pleural effusion. No nodules or masses. AORTIC ARCH: There is no calcific atherosclerosis of the aortic arch. There is no aneurysm, dissection or hemodynamically significant stenosis of the visualized portion of the aorta. Conventional 3 vessel aortic branching pattern. The visualized proximal subclavian arteries are widely patent. RIGHT CAROTID SYSTEM: Normal without aneurysm, dissection or stenosis. LEFT CAROTID SYSTEM:  Normal without aneurysm, dissection or stenosis. VERTEBRAL ARTERIES: Left dominant configuration. Both origins are clearly patent. There is no dissection, occlusion or flow-limiting stenosis to the skull base (V1-V3 segments). CTA HEAD FINDINGS POSTERIOR CIRCULATION: --Vertebral arteries: Normal V4 segments. --Inferior cerebellar arteries: Normal. --Basilar artery: Normal. --Superior cerebellar arteries: Normal. --Posterior cerebral arteries (PCA): Normal. ANTERIOR CIRCULATION: --Intracranial internal carotid arteries: Normal. --Anterior cerebral arteries (ACA): Normal. Both A1 segments are present. Patent anterior communicating artery (a-comm). --Middle cerebral arteries (MCA): Normal. VENOUS SINUSES: As permitted by contrast timing, patent. ANATOMIC VARIANTS: None Review of the MIP images confirms the above findings. IMPRESSION: Normal CTA of the head and neck. Electronically Signed   By: Ulyses Jarred M.D.   On: 04/05/2021 00:28   DG Chest Port 1 View  Result Date: 04/04/2021 CLINICAL DATA:  Dizziness.  Neck and back pain. EXAM: PORTABLE CHEST 1 VIEW COMPARISON:  Chest radiograph 05/06/2020 FINDINGS: Low lung volumes with bibasilar atelectasis, right greater than left. Blunting of the costophrenic angles may represent small effusions. Stable heart size and mediastinal contours. Calcified granuloma at the right lung base. No pneumothorax. No acute osseous abnormalities are seen IMPRESSION: Low lung volumes with bibasilar atelectasis. Possible small pleural effusions. Consider PA and lateral views when patient is able. Electronically Signed   By: Keith Rake M.D.   On: 04/04/2021 23:07    Procedures Procedures   Medications Ordered in ED Medications  ondansetron (ZOFRAN) injection 4 mg (4 mg Intravenous Given 04/04/21 2336)  sodium chloride 0.9 % bolus 1,000 mL (0 mLs Intravenous Stopped 04/05/21 0022)  iohexol (OMNIPAQUE) 350 MG/ML injection 75 mL (75 mLs Intravenous Contrast Given 04/04/21 2347)   ketorolac (TORADOL) 30 MG/ML injection 30 mg (30 mg Intravenous Given 04/05/21 0120)  cyclobenzaprine (FLEXERIL) tablet 5 mg (5 mg Oral Given 04/05/21 0154)    ED Course  I have reviewed the triage vital signs and the nursing notes.  Pertinent labs & imaging results that were available during my care of the patient were reviewed by me and considered in my medical decision making (see chart for details).    MDM Rules/Calculators/A&P                          Patient presents with neck and back pain.  She is overall nontoxic and vital signs are reassuring.  She also reports some dizziness.  Considerations include vertigo and concern for vertebral  artery issue or dissection, musculoskeletal etiology, cervical radiculopathy.  She does have some reproducible pain on her back at the scapula where she is having some pain.  Nursing work-up reviewed from triage.  EKG shows no evidence of acute ischemia.  Chest x-ray without pneumothorax or pneumonia.  Patient has full range of motion of the neck.  Doubt meningitis or torticollis.  Troponin negative.  No significant metabolic derangements.  CTA head and neck obtained to evaluate for vertebral artery dissection or occlusion.  This was normal.  Patient was given Toradol and Flexeril.  Will trial this at home for her pain.  After history, exam, and medical workup I feel the patient has been appropriately medically screened and is safe for discharge home. Pertinent diagnoses were discussed with the patient. Patient was given return precautions.  Final Clinical Impression(s) / ED Diagnoses Final diagnoses:  Neck pain  Acute left-sided thoracic back pain    Rx / DC Orders ED Discharge Orders     None        Delynda Sepulveda, Barbette Hair, MD 04/05/21 0246

## 2021-04-04 NOTE — ED Triage Notes (Signed)
Pt states she came in as she started to  have lightheaded and get dizzy that started 1.5 hrs   Also has had neck pain and upper back pain x today - when she woke at 12 noon   Denies weakness /  changes in vision

## 2021-04-05 ENCOUNTER — Ambulatory Visit (INDEPENDENT_AMBULATORY_CARE_PROVIDER_SITE_OTHER): Payer: 59 | Admitting: Neurology

## 2021-04-05 ENCOUNTER — Encounter: Payer: Self-pay | Admitting: Neurology

## 2021-04-05 ENCOUNTER — Ambulatory Visit: Payer: Self-pay

## 2021-04-05 VITALS — BP 123/85 | HR 91 | Ht 61.0 in | Wt 207.8 lb

## 2021-04-05 DIAGNOSIS — G43809 Other migraine, not intractable, without status migrainosus: Secondary | ICD-10-CM

## 2021-04-05 DIAGNOSIS — M542 Cervicalgia: Secondary | ICD-10-CM | POA: Diagnosis not present

## 2021-04-05 LAB — PARATHYROID HORMONE, INTACT (NO CA): PTH: 49 pg/mL (ref 16–77)

## 2021-04-05 LAB — TROPONIN I (HIGH SENSITIVITY): Troponin I (High Sensitivity): 3 ng/L (ref <18)

## 2021-04-05 LAB — CALCIUM, IONIZED: Calcium, Ion: 5.65 mg/dL — ABNORMAL HIGH (ref 4.8–5.6)

## 2021-04-05 MED ORDER — PREDNISONE 10 MG (21) PO TBPK: ORAL_TABLET | ORAL | 0 refills | Status: DC

## 2021-04-05 MED ORDER — CYCLOBENZAPRINE HCL 5 MG PO TABS
5.0000 mg | ORAL_TABLET | Freq: Once | ORAL | Status: AC
  Administered 2021-04-05: 5 mg via ORAL
  Filled 2021-04-05: qty 1

## 2021-04-05 MED ORDER — KETOROLAC TROMETHAMINE 30 MG/ML IJ SOLN
30.0000 mg | Freq: Once | INTRAMUSCULAR | Status: AC
  Administered 2021-04-05: 30 mg via INTRAVENOUS
  Filled 2021-04-05: qty 1

## 2021-04-05 MED ORDER — CYCLOBENZAPRINE HCL 10 MG PO TABS: 10.0000 mg | ORAL_TABLET | Freq: Two times a day (BID) | ORAL | 0 refills | Status: DC | PRN

## 2021-04-05 MED ORDER — IBUPROFEN 600 MG PO TABS: 600.0000 mg | ORAL_TABLET | Freq: Four times a day (QID) | ORAL | 0 refills | Status: DC | PRN

## 2021-04-05 NOTE — Patient Instructions (Signed)
Take prednisone taper as directed.  If no improvement in one week, contact me Otherwise, follow up 6 months.  May offer virtual visit.

## 2021-04-05 NOTE — Telephone Encounter (Signed)
Pt was seen in ED yesterday for neck pain. Pt received Toradol, Zofran and muscle relaxer. Pt  Stated that she slept all day and began to have pelvic pain and noted small amount of bright red blood on surface of stool and on toilet paper. Pt stated that she was concerned that she was having side effects from the meds today.  Advised pt that people have different responses to muscle relaxers. Some relax and sleep more, other do not notice any change. Advised pt that she was probably tired already and the muscle relaxer made her sleep more. Advised pt that Toradol has low rates of constipation or abdominal pain. Pt was discharged from ED with a prescription of Ibuprofen. Pt stated she would rather not take it but was agreeable to take Tylenol. Pelvic pain is located to the left pelvis . Care advice given and pt verbalized understanding.   Reason for Disposition  [1] Rectal bleeding is minimal (e.g., blood just on toilet paper, few drops, streaks on surface of normal formed BM) AND [2] bleeding recurs 3 or more times on treatment  Answer Assessment - Initial Assessment Questions 1. APPEARANCE of BLOOD: "What color is it?" "Is it passed separately, on the surface of the stool, or mixed in with the stool?"      Bright red 2. AMOUNT: "How much blood was passed?"      Noted in stool and on toilet 3. FREQUENCY: "How many times has blood been passed with the stools?"      2- water was pink. 4. ONSET: "When was the blood first seen in the stools?" (Days or weeks)      today 5. DIARRHEA: "Is there also some diarrhea?" If Yes, ask: "How many diarrhea stools in the past 24 hours?"       6. CONSTIPATION: "Do you have constipation?" If Yes, ask: "How bad is it?"- normal pattern 2-3 times/ week now every other day. No straining     no 7. RECURRENT SYMPTOMS: "Have you had blood in your stools before?" If Yes, ask: "When was the last time?" and "What happened that time?"      no 8. BLOOD THINNERS: "Do you  take any blood thinners?" (e.g., Coumadin/warfarin, Pradaxa/dabigatran, aspirin)     *No Answer* 9. OTHER SYMPTOMS: "Do you have any other symptoms?"  (e.g., abdomen pain, vomiting, dizziness, fever)     Abdominal pain- rectum feels full. Abd pain located lower abdomen- pelvic area left side- has fibroids feels bad menstrual cramp 10. PREGNANCY: "Is there any chance you are pregnant?" "When was your last menstrual period?"      No /last week.  Protocols used: Rectal Bleeding-A-AH

## 2021-04-05 NOTE — Discharge Instructions (Addendum)
You were seen today for neck pain and back pain.  Your work-up is largely reassuring.  This could be musculoskeletal related.  Trial ibuprofen for pain management.  You will also be sent home with a muscle relaxer.  Do not drive while taking muscle relaxers.

## 2021-04-06 DIAGNOSIS — R1033 Periumbilical pain: Secondary | ICD-10-CM | POA: Diagnosis not present

## 2021-04-06 DIAGNOSIS — Z86010 Personal history of colonic polyps: Secondary | ICD-10-CM | POA: Diagnosis not present

## 2021-04-06 DIAGNOSIS — K5900 Constipation, unspecified: Secondary | ICD-10-CM | POA: Diagnosis not present

## 2021-05-02 NOTE — Progress Notes (Signed)
[place] Phone: [phone] Subjective:   [name], am serving as a scribe for Dr. Hulan Saas.  This visit occurred during the SARS-CoV-2 public health emergency.  Safety protocols were in place, including screening questions prior to the visit, additional usage of staff PPE, and extensive cleaning of exam room while observing appropriate contact time as indicated for disinfecting solutions.   I'm seeing this patient by the request  of:  Lin Landsman, MD  CC: headache and balance.   FTD:[id number]  03/05/2021 I believe the patient's infection is Completely resolved at this time.  Continuing to have some difficulty with short-term memory.  Patient is not on any medications that would likely contribute to this.  Still difficult to assess if this is from the possible head injury but once again it did start afterwards.  Patient is doing much better where patient is having improvement with all her other symptoms including being able to drive again and is building up her confidence.  Patient has been able to work just does get some headaches from time to time.  We did discuss with her about the possibility of some other over-the-counter medicines but patient would like to avoid prescription medications too much.  Patient continues to make improvement but may be not back to her full maximum medical improvement yet but is very very close.  At this point I would like patient to continue to increase her activities including driving and working out and see how patient responds.  Patient did have her daughter in the room so did not discuss the behavioral health today.  Patient will follow up with me again 2 months and hopefully will be able to fully release patient at that time total time today reviewing patient's imaging including MRI of the brain, as well as CT scans as well as  Update 05/04/2021 Dilara CECILLIA MENEES is a 45 y.o.  female coming in with complaint of head injury and neck pain. Patient states that she does have good and bad days. States that her eyes will "start blinking" when she is getting a lot of information at work. Patient also notes dizziness with driving and working through a lot of screens. Patient has been doing a lot of training at work and she is fatigued by the end of the day. Also notes that if she doesn't write down daily tasks that she will not remember to do the task. Headaches occurring intermittently but usually on day if more busy at work.   Reviewing patient's chart in June of this year patient was in the emergency room secondary to severe neck pain.  Work-up included an EKG, chest x-ray and CTA head and neck that were all unremarkable.    Past Medical History:  Diagnosis Date   Abortion history    Anemia    Asthma    Fibroid tumor    Hypertension    Migraine headache    Multiple thyroid nodules    Shingles    Past Surgical History:  Procedure Laterality Date   CESAREAN SECTION     COLONOSCOPY, ESOPHAGOGASTRODUODENOSCOPY (EGD) AND ESOPHAGEAL DILATION     CYSTECTOMY     breast   Social History   Socioeconomic History   Marital status: Single    Spouse name: Not on file   Number of children: Not on file   Years of education: Not on file   Highest education level: Not on file  Occupational History   Not on file  Tobacco Use   Smoking status: Never   Smokeless tobacco: Never  Vaping Use   Vaping Use: Never used  Substance and Sexual Activity   Alcohol use: Yes    Alcohol/week: 0.0 standard drinks    Comment: rare   Drug use: No   Sexual activity: Not on file  Other Topics Concern   Not on file  Social History Narrative   Lives with children and boyfriend in a one story home.     Works for [employer].     Education: BS   Social Determinants of Radio broadcast assistant Strain: Not on file  Food Insecurity: Not on file  Transportation Needs: Not on file   Physical Activity: Not on file  Stress: Not on file  Social Connections: Not on file   Allergies  Allergen Reactions   Macrolides And Ketolides Other (See Comments)    Made eye swelling worse   Penicillins Other (See Comments), Itching and Rash    Eye swelling   Shellfish Allergy Anaphylaxis   Advair Diskus [Fluticasone-Salmeterol] Nausea Only    Weakness headaches   Erythromycin Other (See Comments)    Made eye infection worse    Lisinopril Other (See Comments) and Swelling    Caused lips to swell   Nyamyc [Nystatin] Other (See Comments)    Made eye infection worse   Almond (Diagnostic) Other (See Comments)    Stomach pain   Diphenhydramine Hcl (Sleep) Other (See Comments)   Neomycin Swelling   Asa [Aspirin] Nausea Only   Latex Rash   Family History  Problem Relation Age of Onset   Hypertension Father        Living, 57   High Cholesterol Father    Hypertension Mother    Non-Hodgkin's lymphoma Mother        Deceased, 39   Cancer Maternal Grandfather    Cancer Paternal Grandfather    Heart disease Maternal Uncle    Heart disease Paternal Uncle    Healthy Brother    Asthma Son    Asthma Daughter     Current Outpatient Medications (Endocrine & Metabolic):    predniSONE (STERAPRED UNI-PAK 21 TAB) 10 MG (21) TBPK tablet, take 60mg  day 1, then 50mg  day 2, then 40mg  day 3, then 30mg  day 4, then 20mg  day 5, then 10mg  day 6, then STOP  Current Outpatient Medications (Cardiovascular):    cloNIDine (CATAPRES) 0.2 MG tablet, Take 0.2 mg by mouth daily.   triamterene-hydrochlorothiazide (MAXZIDE-25) 37.5-25 MG tablet, Take 1 tablet by mouth daily.  Current Outpatient Medications (Respiratory):    albuterol (PROVENTIL HFA;VENTOLIN HFA) 108 (90 BASE) MCG/ACT inhaler, Inhale 2 puffs into the lungs every 6 (six) hours as needed for wheezing or shortness of breath. For shortness of breath  Current Outpatient Medications (Analgesics):    acetaminophen (TYLENOL) 500 MG tablet,  Take 1,000 mg by mouth every 6 (six) hours as needed for mild pain.   Atogepant (QULIPTA) 60 MG TABS, Take 60 mg by mouth daily.   ibuprofen (ADVIL) 600 MG tablet, Take 1 tablet (600 mg total) by mouth every 6 (six) hours as needed.   Current Outpatient Medications (Other):    cyclobenzaprine (FLEXERIL) 10 MG tablet, Take 1 tablet (10 mg total) by mouth 2 (two) times daily as needed for muscle spasms.   meclizine (ANTIVERT) 12.5 MG tablet, Take 1 tablet (12.5 mg total) by mouth 2 (two) times daily as needed for dizziness.  nystatin ointment (MYCOSTATIN), Apply topically.   Reviewed prior external information including notes and imaging from  primary care provider As well as notes that were available from care everywhere and other healthcare systems.  Past medical history, social, surgical and family history all reviewed in electronic medical record.  No pertanent information unless stated regarding to the chief complaint.   Review of Systems:  No , nausea, vomiting, diarrhea, constipation, dizziness, abdominal pain, skin rash, fevers, chills, night sweats, weight loss, swollen lymph nodes, body aches, joint swelling, chest pain, shortness of breath, mood changes. POSITIVE muscle aches, headaches and some visual changes still intermittently  Objective  Blood pressure 126/86, pulse 88, height 5\' 1"  (1.549 m), weight 208 lb (94.3 kg), SpO2 98 %.   General: No apparent distress alert and oriented x3 mood and affect normal, dressed appropriately.  HEENT: Pupils equal, extraocular movements intact no sign of any nystagmus noted today. Respiratory: Patient's speak in full sentences and does not appear short of breath  Cardiovascular: No lower extremity edema, non tender, no erythema  Gait normal with good balance and coordination.       Impression and Recommendations:     The above documentation has been reviewed and is accurate and complete Lyndal Pulley, DO

## 2021-05-04 ENCOUNTER — Ambulatory Visit (INDEPENDENT_AMBULATORY_CARE_PROVIDER_SITE_OTHER): Payer: 59 | Admitting: Family Medicine

## 2021-05-04 ENCOUNTER — Other Ambulatory Visit: Payer: Self-pay

## 2021-05-04 DIAGNOSIS — S060X0D Concussion without loss of consciousness, subsequent encounter: Secondary | ICD-10-CM | POA: Diagnosis not present

## 2021-05-04 NOTE — Patient Instructions (Signed)
Please forward the form you have We will make a summary letter and fill out the one form I believe we have reached maximal medical improvement but will give the caveat of a possible flare of one time a month for 1-2 days per flare for the next 3 months See me again in 3 months

## 2021-05-04 NOTE — Assessment & Plan Note (Addendum)
Patient has had long history  Did provide summary in the chart today.  Discussed with patient he is Maximal medical improvement at this time.  Patient does have intermittent headaches as well as intermittent dizziness still but very very seldomly.  Patient has returned to work and is doing well with the full duty.  At this point I believe that patient is at maximal medical improvement but will give the caveat of the potential for a flare 1 time a month for 1 to 2 days per episode where patient may have to miss work for the next 3 months and then after that should be resolved.  Hopefully patient will not need this.  Patient is in agreement with this plan.  If necessary patient will follow-up in 3 months otherwise we can send a letter to fully release at that time.  Patient has no symptoms on exam today that makes me concerned on exam. Total time with patient today writing her letter for work, reviewing most recent CT angiogram, emergency room visit, and previous imaging and notes greater than 45 minutes.

## 2021-05-05 ENCOUNTER — Encounter: Payer: Self-pay | Admitting: Family Medicine

## 2021-05-05 NOTE — Assessment & Plan Note (Signed)
Patient is still seeing endocrinology.  Could be contributing to some of patient's symptoms still noted.

## 2021-06-08 ENCOUNTER — Other Ambulatory Visit: Payer: Self-pay

## 2021-06-08 ENCOUNTER — Telehealth: Payer: Self-pay | Admitting: Internal Medicine

## 2021-06-08 ENCOUNTER — Ambulatory Visit
Admission: RE | Admit: 2021-06-08 | Discharge: 2021-06-08 | Disposition: A | Discharge status: Home / Self Care | Payer: 59 | Source: Ambulatory Visit | Attending: Internal Medicine | Admitting: Internal Medicine

## 2021-06-08 DIAGNOSIS — E213 Hyperparathyroidism, unspecified: Secondary | ICD-10-CM

## 2021-06-08 DIAGNOSIS — T781XXA Other adverse food reactions, not elsewhere classified, initial encounter: Secondary | ICD-10-CM | POA: Diagnosis not present

## 2021-06-08 NOTE — Telephone Encounter (Signed)
Called and spoke with patient to let her know that we call [facility] to get the [facility] for her and will call her back with date and time.

## 2021-06-08 NOTE — Telephone Encounter (Signed)
Pt calling in stating that she was supposed to have a bone density test done but has not received a call to schedule it. Pt would like to know the place where she I suppose to have this done at. Pt would like a call back from the nurse. Pt states that if phone call goes to voicemail leave a detailed voicemail.

## 2021-06-08 NOTE — Telephone Encounter (Signed)
Called and spoke with [facility] , pt is set up for 06/08/21 2pm. Called and notify pt of this appt. Gave the patient date/time and address/phone number  for study. Pt isn't wear any pants with buckle  or metal or zips. Pt made aware of this , stated that she was able to go.

## 2021-06-09 ENCOUNTER — Ambulatory Visit: Payer: 59 | Admitting: Internal Medicine

## 2021-06-14 ENCOUNTER — Telehealth: Payer: Self-pay | Admitting: Family Medicine

## 2021-06-14 DIAGNOSIS — Z1231 Encounter for screening mammogram for malignant neoplasm of breast: Secondary | ICD-10-CM | POA: Diagnosis not present

## 2021-06-14 NOTE — Telephone Encounter (Signed)
Patient called stating that there have been two instances that she turned around in a circle (not necessarily fast) and "fell out". She said that she felt like she was going to pass out but tried to catch her self. She fell on the floor and could not move for a few minutes. She also mentioned that she has felt light headed the past couple days. She did not know if this is related to her concussion or if she should be concerned.  Please advise.  [facility], could you take a look at this with Val being out today.)

## 2021-06-14 NOTE — Telephone Encounter (Signed)
Symptoms sound like it probably related to the concussion but I cannot tell over the phone.  If you think you need an appointment I can probably get you an appointment Wednesday with Dr. Glennon Mac in our practice.  If you think you need care sooner you certainly could go to the emergency room.

## 2021-06-14 NOTE — Telephone Encounter (Signed)
Returned pt 's call but had to leave a message.  Advised her that you would return her call tomorrow and that she should seek more urgent care if she con't to have these "falling out" episodes throughout the day today.

## 2021-06-15 NOTE — Telephone Encounter (Signed)
Sent patient MyChart message about scheduling appointment with Dr. Glennon Mac.

## 2021-07-01 ENCOUNTER — Encounter: Payer: Self-pay | Admitting: Neurology

## 2021-07-05 ENCOUNTER — Ambulatory Visit (INDEPENDENT_AMBULATORY_CARE_PROVIDER_SITE_OTHER): Payer: 59 | Admitting: Internal Medicine

## 2021-07-05 ENCOUNTER — Encounter: Payer: Self-pay | Admitting: Internal Medicine

## 2021-07-05 ENCOUNTER — Other Ambulatory Visit: Payer: Self-pay

## 2021-07-05 VITALS — BP 132/72 | HR 82 | Ht 61.0 in | Wt 207.6 lb

## 2021-07-05 DIAGNOSIS — E042 Nontoxic multinodular goiter: Secondary | ICD-10-CM

## 2021-07-05 DIAGNOSIS — E559 Vitamin D deficiency, unspecified: Secondary | ICD-10-CM | POA: Diagnosis not present

## 2021-07-05 NOTE — Patient Instructions (Signed)

## 2021-07-05 NOTE — Progress Notes (Signed)
Name: Audrey [name]  MRN/ DOB: [id number], 05/12/1976    Age/ Sex: 45 y.o., female     PCP: Lin Landsman, MD   Reason for Endocrinology Evaluation: [place]     Initial Endocrinology Clinic Visit: 08/28/2020    PATIENT IDENTIFIER: Audrey [name] is a 45 y.o., female with a past medical history of Asthma and MNG. She has followed with [facility] since 08/28/2020 for consultative assistance with management of her MNG.   HISTORICAL SUMMARY:   She has been diagnosed with thyroid nodules in 2019 , this was noted again during carotid ultrasound, which prompted a thyroid  ultrasound revealing multiple thyroid nodules but none met criteria for a biopsy.   Endoscopy 2019 showing acid reflux , benign plyps, stomach irritation    HYPERCALCEMIA HISTORY: She was noted with a corrected  serum calcium of 10.9 mg/dL in 10/2020 . Repeat serum calcium confirmed elevated by 12/2020 at 10.4 and elevated ionized calcium 5.62 mg/dL. PTH inappropriately normal    DXA normal 05/2021  SUBJECTIVE:     Today (07/05/2021):  Audrey [name] is here for hypercalcemia and MNG.    She denies local neck symptoms  Started a new asthma medicine    Daughter with precocious puberty , follows with Endo   She continues with polydipsia and frequency  No tums  or rolaids  Constipation has resolved  She has been overly tired    She is on Vitamin D 2000 iu daily  Eats a lot of vegetables but low on dairy    HISTORY:  Past Medical History:  Past Medical History:  Diagnosis Date  . Abortion history   . Anemia   . Asthma   . Fibroid tumor   . Hypertension   . Migraine headache   . Multiple thyroid nodules   . Shingles    Past Surgical History:  Past Surgical History:  Procedure Laterality Date  . CESAREAN SECTION    . COLONOSCOPY, ESOPHAGOGASTRODUODENOSCOPY (EGD) AND ESOPHAGEAL DILATION    . CYSTECTOMY     breast   Social History:  reports that she has never smoked. She has never  used smokeless tobacco. She reports current alcohol use. She reports that she does not use drugs. Family History:  Family History  Problem Relation Age of Onset  . Hypertension Father        Living, 27  . High Cholesterol Father   . Hypertension Mother   . Non-Hodgkin's lymphoma Mother        Deceased, 67  . Cancer Maternal Grandfather   . Cancer Paternal Grandfather   . Heart disease Maternal Uncle   . Heart disease Paternal Uncle   . Healthy Brother   . Asthma Son   . Asthma Daughter      HOME MEDICATIONS: Allergies as of 07/05/2021       Reactions   Macrolides And Ketolides Other (See Comments)   Made eye swelling worse   Penicillins Other (See Comments), Itching, Rash   Eye swelling   Shellfish Allergy Anaphylaxis   Advair Diskus [fluticasone-salmeterol] Nausea Only   Weakness headaches   Erythromycin Other (See Comments)   Made eye infection worse   Lisinopril Other (See Comments), Swelling   Caused lips to swell   Nyamyc [nystatin] Other (See Comments)   Made eye infection worse   Almond (diagnostic) Other (See Comments)   Stomach pain   Diphenhydramine Hcl (sleep) Other (See Comments)   Neomycin Swelling   Asa [aspirin] Nausea  Only   Latex Rash        Medication List        Accurate as of July 05, 2021  3:43 PM. If you have any questions, ask your nurse or doctor.          acetaminophen 500 MG tablet Commonly known as: TYLENOL Take 1,000 mg by mouth every 6 (six) hours as needed for mild pain.   albuterol 108 (90 Base) MCG/ACT inhaler Commonly known as: VENTOLIN HFA Inhale 2 puffs into the lungs every 6 (six) hours as needed for wheezing or shortness of breath. For shortness of breath   cloNIDine 0.2 MG tablet Commonly known as: CATAPRES Take 0.2 mg by mouth daily.   cyclobenzaprine 10 MG tablet Commonly known as: FLEXERIL Take 1 tablet (10 mg total) by mouth 2 (two) times daily as needed for muscle spasms.   ibuprofen 600 MG  tablet Commonly known as: ADVIL Take 1 tablet (600 mg total) by mouth every 6 (six) hours as needed.   meclizine 12.5 MG tablet Commonly known as: ANTIVERT Take 1 tablet (12.5 mg total) by mouth 2 (two) times daily as needed for dizziness.   nystatin ointment Commonly known as: MYCOSTATIN Apply topically.   predniSONE 10 MG (21) Tbpk tablet Commonly known as: STERAPRED UNI-PAK 21 TAB take 50m day 1, then 545mday 2, then 4036may 3, then 38m79my 4, then 20mg71m 5, then 10mg 19m6, then STOP   Qulipta 60 MG Tabs Generic drug: Atogepant Take 60 mg by mouth daily.   triamterene-hydrochlorothiazide 37.5-25 MG tablet Commonly known as: MAXZIDE-25 Take 1 tablet by mouth daily.          OBJECTIVE:   PHYSICAL EXAM: VS: BP 132/72 (BP Location: Left Arm, Patient Position: Sitting, Cuff Size: Small)   Pulse 82   Ht '5\' 1"'  (1.549 m)   Wt 207 lb 9.6 oz (94.2 kg)   SpO2 97%   BMI 39.23 kg/m    EXAM: General: Pt appears well and is in NAD  Neck: General: Supple without adenopathy. Thyroid: Thyroid size normal.  Unable to appreciate nodules   Lungs: Clear with good BS bilat with no rales, rhonchi, or wheezes  Heart: Auscultation: RRR.  Abdomen: Normoactive bowel sounds, soft, nontender, without masses or organomegaly palpable  Extremities:  BL LE: No pretibial edema normal ROM and strength.  Mental Status: Judgment, insight: Intact Orientation: Oriented to time, place, and person Memory: Intact for recent and remote events Mood and affect: No depression, anxiety, or agitation     DATA REVIEWED: Results for Eckert,CASANDRA, [id number] 07/06/2021 14:51  Ref. Range 07/05/2021 16:15  Sodium Latest Ref Range: 135 - 145 mEq/L 136  Potassium Latest Ref Range: 3.5 - 5.1 mEq/L 3.7  Chloride Latest Ref Range: 96 - 112 mEq/L 101  CO2 Latest Ref Range: 19 - 32 mEq/L 28  Glucose Latest Ref Range: 70 - 99 mg/dL 85  BUN Latest Ref Range: 6 - 23 mg/dL 18  Creatinine  Latest Ref Range: 0.40 - 1.20 mg/dL 1.10  Calcium Latest Ref Range: 8.4 - 10.5 mg/dL 10.7 (H)  Calcium Ionized Latest Ref Range: 4.8 - 5.6 mg/dL 5.82 (H)  Albumin Latest Ref Range: 3.5 - 5.2 g/dL 3.9  GFR Latest Ref Range: >60.00 mL/min 59.88 (L)  VITD Latest Ref Range: 30.00 - 100.00 ng/mL 23.78 (L)  PTH, Intact Latest Ref Range: 16 - 77 pg/mL 57      Thyroid Ultrasound 06/19/2020  There is an approximately 1.1 x 0.8 x 0.4 cm anechoic cyst within the superior pole the right lobe of the thyroid (labeled 1), which does not meet criteria to recommend percutaneous sampling or continued dedicated follow-up.   There is an approximately 1.3 x 1.1 x 0.8 cm minimally complex cyst within the inferior, posterior aspect the right lobe of the thyroid (labeled 2)., correlating with the nodule seen on preceding carotid Doppler ultrasound. This minimally complex cyst does not meet criteria to recommend percutaneous sampling or continued dedicated follow-up.   There are several scattered additional punctate (sub 4 mm) anechoic cysts and hypoechoic nodules scattered within the remainder of the right lobe of the thyroid, none of which meet imaging criteria to recommend percutaneous sampling or continued dedicated follow-up.   _________________________________________________________   There is an approximately 0.9 x 0.8 x 0.3 cm anechoic cyst within the mid aspect the left lobe of the thyroid (labeled 3), which contains an internal echogenic foci ring down artifact compatible with benign colloid. This benign colloid containing cyst does not meet criteria to recommend percutaneous sampling or continued dedicated follow-up   There is an approximately 0.7 x 0.7 x 0.6 cm ill-defined hypoechoic nodule/pseudonodule within the inferior pole of the left lobe of the thyroid (labeled 4) which does not meet imaging criteria to recommend percutaneous sampling or continued dedicated follow-up.   There are  scattered additional punctate (sub 4 mm) anechoic cysts and hypoechoic nodules with the remainder of the left lobe of the thyroid, none of which meet imaging criteria to recommend percutaneous sampling or continued dedicated follow-up.   IMPRESSION: 1. Findings suggestive of multinodular goiter. 2. None of the discretely measured thyroid nodules, including the incidentally noted minimally complex cyst on preceding carotid Doppler ultrasound, meet imaging criteria to recommend percutaneous sampling or continued dedicated follow-up.  ASSESSMENT / PLAN / RECOMMENDATIONS:    1. Hyperparathyroidism   - Pt with PTH mediated hypercalcemia  -Patient continues with hypercalcemia, PTH inappropriately normal  -We have not been able to proceed with 24-hr urinary collection for calcium due to low vitamin D, this is improving, will proceed with 24-hr  urine colleciton    - DXA normal 05/2021  Recommendations:  - Stay hydrated with regular water  - Consume 2-3 servings of calcium in the diet       2.Multinodular goiter:    -Patient with occasional neck symptoms -Will proceed with repeat ultrasound      3. Vitamin D Deficiency :  - This is improving but continues to be low, will increase as below   Increase Vitamin D 3 3000 iu daily    F/U in 3 months   Signed electronically by: Mack Guise, MD  [facility] End[facility]  [address] [name], [place] [address] Phone: [phone] FAX: [phone]      CC: Lin Landsman, [place] [address] Phone: [phone]  Fax: [phone]   Return to Endocrinology clinic as below: Future Appointments  Date Time Provider [place]  08/06/2021  2:00 PM Lyndal Pulley, DO [facility] None  09/03/2021 10:10 AM Francesca Strome, Melanie Crazier, MD [facility] None  09/29/2021  8:30 AM Pieter Partridge, DO [facility] None

## 2021-07-06 LAB — BASIC METABOLIC PANEL
BUN: 18 mg/dL (ref 6–23)
CO2: 28 mEq/L (ref 19–32)
Calcium: 10.7 mg/dL — ABNORMAL HIGH (ref 8.4–10.5)
Chloride: 101 mEq/L (ref 96–112)
Creatinine, Ser: 1.1 mg/dL (ref 0.40–1.20)
GFR: 59.88 mL/min — ABNORMAL LOW (ref >60.00)
Glucose, Bld: 85 mg/dL (ref 70–99)
Potassium: 3.7 mEq/L (ref 3.5–5.1)
Sodium: 136 mEq/L (ref 135–145)

## 2021-07-06 LAB — ALBUMIN: Albumin: 3.9 g/dL (ref 3.5–5.2)

## 2021-07-06 LAB — PARATHYROID HORMONE, INTACT (NO CA): PTH: 57 pg/mL (ref 16–77)

## 2021-07-06 LAB — VITAMIN D 25 HYDROXY (VIT D DEFICIENCY, FRACTURES): VITD: 23.78 ng/mL — ABNORMAL LOW (ref 30.00–100.00)

## 2021-07-06 LAB — CALCIUM, IONIZED: Calcium, Ion: 5.82 mg/dL — ABNORMAL HIGH (ref 4.8–5.6)

## 2021-07-08 ENCOUNTER — Other Ambulatory Visit: Payer: 59

## 2021-07-09 LAB — CREATININE, URINE, 24 HOUR: Creatinine, 24H Ur: 1.72 g/(24.h) (ref 0.50–2.15)

## 2021-07-09 LAB — EXTRA URINE SPECIMEN

## 2021-07-09 LAB — CALCIUM, URINE, 24 HOUR: Calcium, 24H Urine: 36 mg/24 h

## 2021-07-21 ENCOUNTER — Ambulatory Visit
Admission: RE | Admit: 2021-07-21 | Discharge: 2021-07-21 | Disposition: A | Discharge status: Home / Self Care | Payer: 59 | Source: Ambulatory Visit | Attending: Internal Medicine | Admitting: Internal Medicine

## 2021-07-21 DIAGNOSIS — E041 Nontoxic single thyroid nodule: Secondary | ICD-10-CM | POA: Diagnosis not present

## 2021-07-21 DIAGNOSIS — E042 Nontoxic multinodular goiter: Secondary | ICD-10-CM

## 2021-08-02 ENCOUNTER — Other Ambulatory Visit: Payer: 59

## 2021-08-06 ENCOUNTER — Ambulatory Visit: Payer: 59 | Admitting: Family Medicine

## 2021-08-06 ENCOUNTER — Telehealth: Payer: Self-pay | Admitting: Family Medicine

## 2021-08-06 NOTE — Telephone Encounter (Signed)
Patient asked if we could resend the last form that was completed for her. She said that there has been some change in who has been handling this at her job and she is unsure if this was received or not.  Patient was scheduled with Dr Tamala Julian for 08/06/2021 but has been rescheduled due to provider being out of the office.

## 2021-08-09 NOTE — Telephone Encounter (Signed)
Form faxed and patient notified.

## 2021-08-20 ENCOUNTER — Ambulatory Visit: Payer: 59 | Admitting: Family Medicine

## 2021-08-23 NOTE — Progress Notes (Signed)
[facility] [place] [address] [place] Phone: [phone] Subjective:   IVilma Meckel, am serving as a scribe for Dr. Hulan Saas. This visit occurred during the SARS-CoV-2 public health emergency.  Safety protocols were in place, including screening questions prior to the visit, additional usage of staff PPE, and extensive cleaning of exam room while observing appropriate contact time as indicated for disinfecting solutions.   I'm seeing this patient by the request  of:  Lin Landsman, MD  CC: headache follow up   [id number]  05/04/2021 Patient has had long history  Did provide summary in the chart today.  Discussed with patient he is Maximal medical improvement at this time.  Patient does have intermittent headaches as well as intermittent dizziness still but very very seldomly.  Patient has returned to work and is doing well with the full duty.  At this point I believe that patient is at maximal medical improvement but will give the caveat of the potential for a flare 1 time a month for 1 to 2 days per episode where patient may have to miss work for the next 3 months and then after that should be resolved.  Hopefully patient will not need this.  Patient is in agreement with this plan.  If necessary patient will follow-up in 3 months otherwise we can send a letter to fully release at that time.  Patient has no symptoms on exam today that makes me concerned on exam. Total time with patient today writing her letter for work, reviewing most recent CT angiogram, emergency room visit, and previous imaging and notes greater than 45 minutes.  Patient is still seeing endocrinology.  Could be contributing to some of patient's symptoms still noted.  Updated 08/24/2021 Olivette AELIANA SPATES is a 45 y.o. female coming in with complaint of concussion symptoms. Short term memory loss, more emotional than usual, headaches, pressure in head, and not feeling like  herself.  Patient is tearful.  Feels like she is having some mild increase in anxiety but does not know if it is from that.  Patient feels like some of it still could be secondary to the concussion. Patient was at last exam given a note in July stating that she had 3 more months of potential intermittent symptoms but now feels like she has taken a step back and feels like the note needs to be extended with the same potential restrictions.      Past Medical History:  Diagnosis Date   Abortion history    Anemia    Asthma    Fibroid tumor    Hypertension    Migraine headache    Multiple thyroid nodules    Shingles    Past Surgical History:  Procedure Laterality Date   CESAREAN SECTION     COLONOSCOPY, ESOPHAGOGASTRODUODENOSCOPY (EGD) AND ESOPHAGEAL DILATION     CYSTECTOMY     breast   Social History   Socioeconomic History   Marital status: Single    Spouse name: Not on file   Number of children: Not on file   Years of education: Not on file   Highest education level: Not on file  Occupational History   Not on file  Tobacco Use   Smoking status: Never   Smokeless tobacco: Never  Vaping Use   Vaping Use: Never used  Substance and Sexual Activity   Alcohol use: Yes    Alcohol/week: 0.0 standard drinks    Comment: rare  Drug use: No   Sexual activity: Not on file  Other Topics Concern   Not on file  Social History Narrative   Lives with children and boyfriend in a one story home.     Works for [employer].     Education: BS   Social Determinants of Radio broadcast assistant Strain: Not on file  Food Insecurity: Not on file  Transportation Needs: Not on file  Physical Activity: Not on file  Stress: Not on file  Social Connections: Not on file   Allergies  Allergen Reactions   Macrolides And Ketolides Other (See Comments)    Made eye swelling worse   Penicillins Other (See Comments), Itching and Rash    Eye swelling   Shellfish Allergy Anaphylaxis   Advair  Diskus [Fluticasone-Salmeterol] Nausea Only    Weakness headaches   Erythromycin Other (See Comments)    Made eye infection worse    Lisinopril Other (See Comments) and Swelling    Caused lips to swell   Nyamyc [Nystatin] Other (See Comments)    Made eye infection worse   Almond (Diagnostic) Other (See Comments)    Stomach pain   Diphenhydramine Hcl (Sleep) Other (See Comments)   Neomycin Swelling   Asa [Aspirin] Nausea Only   Latex Rash   Family History  Problem Relation Age of Onset   Hypertension Father        Living, 80   High Cholesterol Father    Hypertension Mother    Non-Hodgkin's lymphoma Mother        Deceased, 32   Cancer Maternal Grandfather    Cancer Paternal Grandfather    Heart disease Maternal Uncle    Heart disease Paternal Uncle    Healthy Brother    Asthma Son    Asthma Daughter      Current Outpatient Medications (Cardiovascular):    cloNIDine (CATAPRES) 0.2 MG tablet, Take 0.2 mg by mouth daily.   triamterene-hydrochlorothiazide (MAXZIDE-25) 37.5-25 MG tablet, Take 1 tablet by mouth daily.  Current Outpatient Medications (Respiratory):    ADVAIR HFA 115-21 MCG/ACT inhaler, Inhale 2 puffs into the lungs 2 (two) times daily.   albuterol (PROVENTIL HFA;VENTOLIN HFA) 108 (90 BASE) MCG/ACT inhaler, Inhale 2 puffs into the lungs every 6 (six) hours as needed for wheezing or shortness of breath. For shortness of breath  Current Outpatient Medications (Analgesics):    acetaminophen (TYLENOL) 500 MG tablet, Take 1,000 mg by mouth every 6 (six) hours as needed for mild pain.   Atogepant (QULIPTA) 60 MG TABS, Take 60 mg by mouth daily.   ibuprofen (ADVIL) 600 MG tablet, Take 1 tablet (600 mg total) by mouth every 6 (six) hours as needed.   Current Outpatient Medications (Other):    venlafaxine XR (EFFEXOR XR) 37.5 MG 24 hr capsule, Take 1 capsule (37.5 mg total) by mouth daily with breakfast.   Cholecalciferol (VITAMIN D3) 50 MCG (2000 UT) capsule, Take  2,000 Units by mouth daily.   cyclobenzaprine (FLEXERIL) 10 MG tablet, Take 1 tablet (10 mg total) by mouth 2 (two) times daily as needed for muscle spasms.   meclizine (ANTIVERT) 12.5 MG tablet, Take 1 tablet (12.5 mg total) by mouth 2 (two) times daily as needed for dizziness.   nystatin ointment (MYCOSTATIN), Apply topically.   Reviewed prior external information including notes and imaging from  primary care provider As well as notes that were available from care everywhere and other healthcare systems.  Past medical history, social, surgical and family history  all reviewed in electronic medical record.  No pertanent information unless stated regarding to the chief complaint.   Review of Systems:  No  visual changes, nausea, vomiting, diarrhea, constipation,  abdominal pain, skin rash, fevers, chills, night sweats, weight loss, swollen lymph nodes, body aches, joint swelling, chest pain, shortness of breath, m POSITIVE muscle aches, headache, dizziness, patient has had emotional stress recently.  Objective  Height 5\' 1"  (1.549 m).   General: No apparent distress alert and oriented x3 mood and affect normal, dressed appropriately.  Patient is very tearful accompanied with daughter. HEENT: Pupils equal, extraocular movements intact no signs of any nystagmus noted.  No goiter noted Respiratory: Patient's speak in full sentences and does not appear short of breath  Cardiovascular: No lower extremity edema, non tender, no erythema  Gait normal with good balance and coordination.  Patient with recall is doing very well.  Patient is able to give me the dates of different days when she was having symptoms that were worse or not.   Impression and Recommendations:     The above documentation has been reviewed and is accurate and complete Lyndal Pulley, DO

## 2021-08-24 ENCOUNTER — Other Ambulatory Visit: Payer: Self-pay

## 2021-08-24 ENCOUNTER — Ambulatory Visit (INDEPENDENT_AMBULATORY_CARE_PROVIDER_SITE_OTHER): Payer: 59 | Admitting: Family Medicine

## 2021-08-24 VITALS — Ht 61.0 in

## 2021-08-24 DIAGNOSIS — F4323 Adjustment disorder with mixed anxiety and depressed mood: Secondary | ICD-10-CM | POA: Diagnosis not present

## 2021-08-24 DIAGNOSIS — S060X0D Concussion without loss of consciousness, subsequent encounter: Secondary | ICD-10-CM

## 2021-08-24 DIAGNOSIS — R69 Illness, unspecified: Secondary | ICD-10-CM | POA: Diagnosis not present

## 2021-08-24 DIAGNOSIS — E559 Vitamin D deficiency, unspecified: Secondary | ICD-10-CM | POA: Diagnosis not present

## 2021-08-24 HISTORY — DX: Adjustment disorder with mixed anxiety and depressed mood: F43.23

## 2021-08-24 MED ORDER — VENLAFAXINE HCL ER 37.5 MG PO CP24: 37.5000 mg | ORAL_CAPSULE | Freq: Every day | ORAL | 0 refills | Status: DC

## 2021-08-24 NOTE — Assessment & Plan Note (Signed)
Encouraged her to follow-up with endocrinology in case this is playing a role as well.  If continuing to have difficulty can consider laboratory work-up here.

## 2021-08-24 NOTE — Assessment & Plan Note (Addendum)
I believe the patient does have some underlying anxiety that is contributing to more of the symptomatology at this time.  I do not think that this is secondary to the concussion but the concussion could have exacerbated an underlying problem.  At this point patient will start on Effexor.  Discussed with patient about potential side effects.  Follow-up with me again in 4 weeks  Spent an extra 20 minutes discussing with patient about the proper forms for her job the patient is significantly stressed about.

## 2021-08-24 NOTE — Patient Instructions (Addendum)
Ref to behavioral health Changed what was necessary on paperwork MyChart in 2 weeks See you again in 4 weeks

## 2021-08-24 NOTE — Assessment & Plan Note (Signed)
Discussed with patient at this time that I do not feel that this is secondary to the concussion at the moment.  Patient has had a significant amount of imaging as well as laboratory test and did not make patient have near complete maximal medical improvement.  Patient was still having some intermittent symptoms fairly at the last time we had seen patient.  I do feel we can extend some of the restrictions of work for another 3 months to give her some time but I do feel that there is some underlying psychological aspect likely with anxiety or depression.  We will start Effexor which could help with the potential headaches as well as with the anxiety and will refer patient to behavioral health which patient is already looking into herself.  Patient is working with other people at her job as well secondary to the amount of stress that she is having.  Patient will follow up with me again in 4 weeks.

## 2021-09-03 ENCOUNTER — Ambulatory Visit: Payer: No Typology Code available for payment source | Admitting: Internal Medicine

## 2021-09-13 ENCOUNTER — Encounter: Payer: Self-pay | Admitting: Family Medicine

## 2021-09-14 ENCOUNTER — Other Ambulatory Visit: Payer: Self-pay

## 2021-09-14 DIAGNOSIS — F419 Anxiety disorder, unspecified: Secondary | ICD-10-CM

## 2021-09-14 DIAGNOSIS — F32A Depression, unspecified: Secondary | ICD-10-CM

## 2021-09-17 NOTE — Progress Notes (Signed)
[place] Phone: [phone] Subjective:   I, Audrey [name], am serving as a Education administrator for Doctor Charlann Boxer   I'm seeing this patient by the request  of:  Lin Landsman, MD  CC: head injury follow up   [id number]  08/24/2021 I believe the patient does have some underlying anxiety that is contributing to more of the symptomatology at this time.  I do not think that this is secondary to the concussion but the concussion could have exacerbated an underlying problem.  At this point patient will start on Effexor.  Discussed with patient about potential side effects.  Follow-up with me again in 4 weeks   Spent an extra 20 minutes discussing with patient about the proper forms for her job the patient is significantly stressed about.  Update 09/20/2021.  Audrey [name] is a 45 y.o. female coming in with complaint of head injury. Was having anxiety more then symptoms of the concussion. Started on effexor at last exam.,  Patient states the medications get her through work but it puts her to sleep once she's done with work has no energy at work or when she gets off. More focused at work everything else is the same has appointment with behavior specialist the 9th of this month headaches are coming back but not everyday has appointment with Dr Tana Felts the 13/14th of this month  Patient continues to have difficulty with her thyroid as well as her calcium levels.  Being followed by endocrinology.    Past Medical History:  Diagnosis Date   Abortion history    Anemia    Asthma    Fibroid tumor    Hypertension    Migraine headache    Multiple thyroid nodules    Shingles    Past Surgical History:  Procedure Laterality Date   CESAREAN SECTION     COLONOSCOPY, ESOPHAGOGASTRODUODENOSCOPY (EGD) AND ESOPHAGEAL DILATION     CYSTECTOMY     breast   Social History   Socioeconomic History   Marital status: Single    Spouse name:  Not on file   Number of children: Not on file   Years of education: Not on file   Highest education level: Not on file  Occupational History   Not on file  Tobacco Use   Smoking status: Never   Smokeless tobacco: Never  Vaping Use   Vaping Use: Never used  Substance and Sexual Activity   Alcohol use: Yes    Alcohol/week: 0.0 standard drinks    Comment: rare   Drug use: No   Sexual activity: Not on file  Other Topics Concern   Not on file  Social History Narrative   Lives with children and boyfriend in a one story home.     Works for [employer].     Education: BS   Social Determinants of Health   Financial Resource Strain: Not on file  Food Insecurity: Not on file  Transportation Needs: Not on file  Physical Activity: Not on file  Stress: Not on file  Social Connections: Not on file   Allergies  Allergen Reactions   Macrolides And Ketolides Other (See Comments)    Made eye swelling worse   Penicillins Other (See Comments), Itching and Rash    Eye swelling   Shellfish Allergy Anaphylaxis   Advair Diskus [Fluticasone-Salmeterol] Nausea Only    Weakness headaches   Erythromycin Other (See Comments)    Made eye infection worse  Lisinopril Other (See Comments) and Swelling    Caused lips to swell   Nyamyc [Nystatin] Other (See Comments)    Made eye infection worse   Almond (Diagnostic) Other (See Comments)    Stomach pain   Diphenhydramine Hcl (Sleep) Other (See Comments)   Neomycin Swelling   Asa [Aspirin] Nausea Only   Latex Rash   Family History  Problem Relation Age of Onset   Hypertension Father        Living, 82   High Cholesterol Father    Hypertension Mother    Non-Hodgkin's lymphoma Mother        Deceased, 20   Cancer Maternal Grandfather    Cancer Paternal Grandfather    Heart disease Maternal Uncle    Heart disease Paternal Uncle    Healthy Brother    Asthma Son    Asthma Daughter      Current Outpatient Medications (Cardiovascular):     cloNIDine (CATAPRES) 0.2 MG tablet, Take 0.2 mg by mouth daily.   EPINEPHrine 0.3 mg/0.3 mL IJ SOAJ injection, Inject 0.3 mg into the muscle as needed for anaphylaxis.   triamterene-hydrochlorothiazide (MAXZIDE-25) 37.5-25 MG tablet, Take 1 tablet by mouth daily.  Current Outpatient Medications (Respiratory):    ADVAIR HFA 115-21 MCG/ACT inhaler, Inhale 2 puffs into the lungs 2 (two) times daily.   albuterol (PROVENTIL HFA;VENTOLIN HFA) 108 (90 BASE) MCG/ACT inhaler, Inhale 2 puffs into the lungs every 6 (six) hours as needed for wheezing or shortness of breath. For shortness of breath  Current Outpatient Medications (Analgesics):    acetaminophen (TYLENOL) 500 MG tablet, Take 1,000 mg by mouth every 6 (six) hours as needed for mild pain.   Atogepant (QULIPTA) 60 MG TABS, Take 60 mg by mouth daily.   Current Outpatient Medications (Other):    Cholecalciferol (VITAMIN D3) 50 MCG (2000 UT) capsule, Take 2,000 Units by mouth daily.   cyclobenzaprine (FLEXERIL) 10 MG tablet, Take 1 tablet (10 mg total) by mouth 2 (two) times daily as needed for muscle spasms.   meclizine (ANTIVERT) 12.5 MG tablet, Take 1 tablet (12.5 mg total) by mouth 2 (two) times daily as needed for dizziness.   nystatin ointment (MYCOSTATIN), Apply topically.   venlafaxine XR (EFFEXOR XR) 37.5 MG 24 hr capsule, Take 1 capsule (37.5 mg total) by mouth daily with breakfast.   Reviewed prior external information including notes and imaging from  primary care provider As well as notes that were available from care everywhere and other healthcare systems.  Past medical history, social, surgical and family history all reviewed in electronic medical record.  No pertanent information unless stated regarding to the chief complaint.   Review of Systems:  No  visual changes, nausea, vomiting, diarrhea, constipation,  abdominal pain, skin rash, fevers, chills, night sweats, weight loss, swollen lymph nodes, body aches, joint  swelling, chest pain, shortness of breath, mood changes. POSITIVE muscle aches, headache, dizziness   Objective  Blood pressure 122/80, pulse 78, height 5\' 1"  (1.549 m), weight 209 lb (94.8 kg), SpO2 99 %.   General: No apparent distress alert and oriented x3 mood and affect normal, dressed appropriately.  HEENT: Pupils equal, extraocular movements intact  Respiratory: Patient's speak in full sentences and does not appear short of breath  Cardiovascular: No lower extremity edema, non tender, no erythema  Gait normal with good balance and coordination.  MSK: Patient is sitting comfortably in her chair.    Impression and Recommendations:     The above documentation has  been reviewed and is accurate and complete Lyndal Pulley, DO

## 2021-09-20 ENCOUNTER — Ambulatory Visit (INDEPENDENT_AMBULATORY_CARE_PROVIDER_SITE_OTHER): Payer: 59 | Admitting: Family Medicine

## 2021-09-20 ENCOUNTER — Other Ambulatory Visit: Payer: Self-pay

## 2021-09-20 VITALS — BP 122/80 | HR 78 | Ht 61.0 in | Wt 209.0 lb

## 2021-09-20 DIAGNOSIS — R69 Illness, unspecified: Secondary | ICD-10-CM | POA: Diagnosis not present

## 2021-09-20 DIAGNOSIS — F4323 Adjustment disorder with mixed anxiety and depressed mood: Secondary | ICD-10-CM | POA: Diagnosis not present

## 2021-09-20 DIAGNOSIS — M2550 Pain in unspecified joint: Secondary | ICD-10-CM

## 2021-09-20 LAB — COMPREHENSIVE METABOLIC PANEL
ALT: 20 U/L (ref 0–35)
AST: 18 U/L (ref 0–37)
Albumin: 4.1 g/dL (ref 3.5–5.2)
Alkaline Phosphatase: 57 U/L (ref 39–117)
BUN: 16 mg/dL (ref 6–23)
CO2: 28 mEq/L (ref 19–32)
Calcium: 10.6 mg/dL — ABNORMAL HIGH (ref 8.4–10.5)
Chloride: 102 mEq/L (ref 96–112)
Creatinine, Ser: 1.02 mg/dL (ref 0.40–1.20)
GFR: 65.47 mL/min (ref >60.00)
Glucose, Bld: 90 mg/dL (ref 70–99)
Potassium: 3.5 mEq/L (ref 3.5–5.1)
Sodium: 137 mEq/L (ref 135–145)
Total Bilirubin: 0.7 mg/dL (ref 0.2–1.2)
Total Protein: 7.8 g/dL (ref 6.0–8.3)

## 2021-09-20 LAB — CBC WITH DIFFERENTIAL/PLATELET
Basophils Absolute: 0 10*3/uL (ref 0.0–0.1)
Basophils Relative: 0.5 % (ref 0.0–3.0)
Eosinophils Absolute: 0.1 10*3/uL (ref 0.0–0.7)
Eosinophils Relative: 1.3 % (ref 0.0–5.0)
HCT: 36.2 % (ref 36.0–46.0)
Hemoglobin: 11.9 g/dL — ABNORMAL LOW (ref 12.0–15.0)
Lymphocytes Relative: 23.2 % (ref 12.0–46.0)
Lymphs Abs: 1.4 10*3/uL (ref 0.7–4.0)
MCHC: 32.8 g/dL (ref 30.0–36.0)
MCV: 84.7 fl (ref 78.0–100.0)
Monocytes Absolute: 0.6 10*3/uL (ref 0.1–1.0)
Monocytes Relative: 10.1 % (ref 3.0–12.0)
Neutro Abs: 4 10*3/uL (ref 1.4–7.7)
Neutrophils Relative %: 64.9 % (ref 43.0–77.0)
Platelets: 294 10*3/uL (ref 150.0–400.0)
RBC: 4.27 Mil/uL (ref 3.87–5.11)
RDW: 15.6 % — ABNORMAL HIGH (ref 11.5–15.5)
WBC: 6.1 10*3/uL (ref 4.0–10.5)

## 2021-09-20 LAB — VITAMIN D 25 HYDROXY (VIT D DEFICIENCY, FRACTURES): VITD: 29.71 ng/mL — ABNORMAL LOW (ref 30.00–100.00)

## 2021-09-20 LAB — VITAMIN B12: Vitamin B-12: 344 pg/mL (ref 211–911)

## 2021-09-20 LAB — TSH: TSH: 0.92 u[IU]/mL (ref 0.35–5.50)

## 2021-09-20 LAB — URIC ACID: Uric Acid, Serum: 6.2 mg/dL (ref 2.4–7.0)

## 2021-09-20 LAB — T3, FREE: T3, Free: 3.2 pg/mL (ref 2.3–4.2)

## 2021-09-20 LAB — FERRITIN: Ferritin: 9.4 ng/mL — ABNORMAL LOW (ref 10.0–291.0)

## 2021-09-20 MED ORDER — VENLAFAXINE HCL ER 37.5 MG PO CP24: 37.5000 mg | ORAL_CAPSULE | Freq: Every day | ORAL | 0 refills | Status: DC

## 2021-09-20 NOTE — Patient Instructions (Addendum)
Great to see you  Keep the Effexor where we are I think behavioral health will be helpful Lets see what Dr. Tomi Likens has to say See you again in 5-6 weeks

## 2021-09-20 NOTE — Assessment & Plan Note (Signed)
Once again do feel that underlying anxiety and depression could be contributing to some of the symptomatology.  Patient has responded to the low-dose of the Effexor that has helped with some of the mood, as well as some of the energy but patient is still hitting a wall on a regular basis.  Discussed with patient about icing regimen, home exercises, which activities to do which wants to avoid.  We will get laboratory work-up to see if hypercalcemia could be potentially playing a role as well.  Patient given some mild restrictions at work to allow her to have some days off if she does need to have doctors appointments.  Follow-up with me again in 4 to 8 weeks.  Total time with patient greater than 31 minutes with doing this, as well as paperwork, as well as reviewing patient's previous notes.

## 2021-09-25 LAB — PTH, INTACT AND CALCIUM
Calcium: 10.6 mg/dL — ABNORMAL HIGH (ref 8.6–10.2)
PTH: 78 pg/mL — ABNORMAL HIGH (ref 16–77)

## 2021-09-25 LAB — PTH-RELATED PEPTIDE: PTH-Related Protein (PTH-RP): 13 pg/mL (ref 11–20)

## 2021-09-27 ENCOUNTER — Ambulatory Visit (INDEPENDENT_AMBULATORY_CARE_PROVIDER_SITE_OTHER): Payer: 59 | Admitting: Psychology

## 2021-09-27 DIAGNOSIS — R69 Illness, unspecified: Secondary | ICD-10-CM | POA: Diagnosis not present

## 2021-09-27 DIAGNOSIS — F4323 Adjustment disorder with mixed anxiety and depressed mood: Secondary | ICD-10-CM
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
## 2021-09-29 ENCOUNTER — Other Ambulatory Visit: Payer: Self-pay

## 2021-09-29 ENCOUNTER — Telehealth (INDEPENDENT_AMBULATORY_CARE_PROVIDER_SITE_OTHER): Payer: 59 | Admitting: Neurology

## 2021-09-29 ENCOUNTER — Encounter: Payer: Self-pay | Admitting: Neurology

## 2021-09-29 ENCOUNTER — Telehealth: Payer: 59 | Admitting: Neurology

## 2021-09-29 DIAGNOSIS — M542 Cervicalgia: Secondary | ICD-10-CM | POA: Diagnosis not present

## 2021-09-29 DIAGNOSIS — G43809 Other migraine, not intractable, without status migrainosus: Secondary | ICD-10-CM

## 2021-09-30 ENCOUNTER — Telehealth: Payer: 59 | Admitting: Neurology
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
## 2021-10-05 ENCOUNTER — Telehealth: Payer: 59 | Admitting: Neurology

## 2021-10-05 ENCOUNTER — Encounter: Payer: Self-pay | Admitting: Family Medicine

## 2021-10-26 NOTE — Progress Notes (Signed)
[place] Phone: [phone] Subjective:   [name], am serving as a scribe for Dr. Hulan Saas. This visit occurred during the SARS-CoV-2 public health emergency.  Safety protocols were in place, including screening questions prior to the visit, additional usage of staff PPE, and extensive cleaning of exam room while observing appropriate contact time as indicated for disinfecting solutions.   I'm seeing this patient by the request  of:  Lin Landsman, MD  CC: Headache, head injury and follow-up  FXO:VANVBTYOMA  09/20/2021 Once again do feel that underlying anxiety and depression could be contributing to some of the symptomatology.  Patient has responded to the low-dose of the Effexor that has helped with some of the mood, as well as some of the energy but patient is still hitting a wall on a regular basis.  Discussed with patient about icing regimen, home exercises, which activities to do which wants to avoid.  We will get laboratory work-up to see if hypercalcemia could be potentially playing a role as well.  Patient given some mild restrictions at work to allow her to have some days off if she does need to have doctors appointments.  Follow-up with me again in 4 to 8 weeks.  Total time with patient greater than 31 minutes with doing this, as well as paperwork, as well as reviewing patient's previous notes.  Updated 10/27/2021 Audrey [name] is a 46 y.o. female coming in with complaint of polyarthralgia. States that she is feeling better with the Effexor. She said that she feels a weird sensation after taking medication that causes tingling in tongue own to throat and she is unable to taste for a short period of time. Will need FMLA paperwork for intermittent time off for appointments. Tried B12 during menstruation which she felt did help but is unsure. Also complaining of neck pain today and sometimes has leg and foot  pain. Feels this is from supplements.   Patient has been seen previously.  Please see previous notes.  Patient seems to have complete resolution of the concussion symptoms and then unfortunately continues to have some headaches.  Continuing to see neurology.  Found to have some B12 being low and was to do supplementation.  Continues to have low vitamin D.  Continues to have elevation of hypercalcemia as well.  Significant decrease in ferritin.  Anemia noted  Patient is on Effexor 37.5 mg daily to help with the headaches.      Past Medical History:  Diagnosis Date   Abortion history    Anemia    Asthma    Fibroid tumor    Hypertension    Migraine headache    Multiple thyroid nodules    Shingles    Past Surgical History:  Procedure Laterality Date   CESAREAN SECTION     COLONOSCOPY, ESOPHAGOGASTRODUODENOSCOPY (EGD) AND ESOPHAGEAL DILATION     CYSTECTOMY     breast   Social History   Socioeconomic History   Marital status: Single    Spouse name: Not on file   Number of children: Not on file   Years of education: Not on file   Highest education level: Not on file  Occupational History   Not on file  Tobacco Use   Smoking status: Never   Smokeless tobacco: Never  Vaping Use   Vaping Use: Never used  Substance and Sexual Activity   Alcohol use: Yes    Alcohol/week: 0.0 standard  drinks    Comment: rare   Drug use: No   Sexual activity: Not on file  Other Topics Concern   Not on file  Social History Narrative   Lives with children and boyfriend in a one story home.     Works for [employer].     Education: BS   Social Determinants of Radio broadcast assistant Strain: Not on file  Food Insecurity: Not on file  Transportation Needs: Not on file  Physical Activity: Not on file  Stress: Not on file  Social Connections: Not on file   Allergies  Allergen Reactions   Macrolides And Ketolides Other (See Comments)    Made eye swelling worse   Penicillins Other (See  Comments), Itching and Rash    Eye swelling   Shellfish Allergy Anaphylaxis   Advair Diskus [Fluticasone-Salmeterol] Nausea Only    Weakness headaches   Erythromycin Other (See Comments)    Made eye infection worse    Lisinopril Other (See Comments) and Swelling    Caused lips to swell   Nyamyc [Nystatin] Other (See Comments)    Made eye infection worse   Almond (Diagnostic) Other (See Comments)    Stomach pain   Diphenhydramine Hcl (Sleep) Other (See Comments)   Neomycin Swelling   Asa [Aspirin] Nausea Only   Latex Rash   Family History  Problem Relation Age of Onset   Hypertension Father        Living, 60   High Cholesterol Father    Hypertension Mother    Non-Hodgkin's lymphoma Mother        Deceased, 96   Cancer Maternal Grandfather    Cancer Paternal Grandfather    Heart disease Maternal Uncle    Heart disease Paternal Uncle    Healthy Brother    Asthma Son    Asthma Daughter      Current Outpatient Medications (Cardiovascular):    cloNIDine (CATAPRES) 0.2 MG tablet, Take 0.2 mg by mouth daily.   EPINEPHrine 0.3 mg/0.3 mL IJ SOAJ injection, Inject 0.3 mg into the muscle as needed for anaphylaxis.   triamterene-hydrochlorothiazide (MAXZIDE-25) 37.5-25 MG tablet, Take 1 tablet by mouth daily.  Current Outpatient Medications (Respiratory):    ADVAIR HFA 115-21 MCG/ACT inhaler, Inhale 2 puffs into the lungs 2 (two) times daily.   albuterol (PROVENTIL HFA;VENTOLIN HFA) 108 (90 BASE) MCG/ACT inhaler, Inhale 2 puffs into the lungs every 6 (six) hours as needed for wheezing or shortness of breath. For shortness of breath  Current Outpatient Medications (Analgesics):    acetaminophen (TYLENOL) 500 MG tablet, Take 1,000 mg by mouth every 6 (six) hours as needed for mild pain.   Atogepant (QULIPTA) 60 MG TABS, Take 60 mg by mouth daily.   Current Outpatient Medications (Other):    Cholecalciferol (VITAMIN D3) 50 MCG (2000 UT) capsule, Take 2,000 Units by mouth daily.    nystatin ointment (MYCOSTATIN), Apply topically.   venlafaxine XR (EFFEXOR XR) 37.5 MG 24 hr capsule, Take 1 capsule (37.5 mg total) by mouth daily with breakfast.   Reviewed prior external information including notes and imaging from  primary care provider As well as notes that were available from care everywhere and other healthcare systems.  Reviewed most recent urgent care notes of this year as well as the last neurology note.  Past medical history, social, surgical and family history all reviewed in electronic medical record.  No pertanent information unless stated regarding to the chief complaint.   Review of Systems:  No  visual changes, nausea, vomiting, diarrhea, constipation, dizziness, abdominal pain, skin rash, fevers, chills, night sweats, weight loss, swollen lymph nodes,  joint swelling, chest pain, shortness of breath, mood changes. POSITIVE muscle aches, headache, body aches   Objective  Blood pressure 130/82, pulse 76, height 5\' 1"  (1.549 m), weight 201 lb (91.2 kg), peak flow 96 L/min.   General: No apparent distress alert and oriented x3 mood and affect normal, dressed appropriately.  HEENT: Pupils equal, extraocular movements intact  Respiratory: Patient's speak in full sentences and does not appear short of breath  Cardiovascular: No lower extremity edema, non tender, no erythema  Gait normal with good balance and coordination.  MSK: Patient is sitting comfortably overall.  No significant nystagmus noted.    Impression and Recommendations:     The above documentation has been reviewed and is accurate and complete Lyndal Pulley, DO

## 2021-10-27 ENCOUNTER — Other Ambulatory Visit: Payer: Self-pay

## 2021-10-27 ENCOUNTER — Ambulatory Visit (INDEPENDENT_AMBULATORY_CARE_PROVIDER_SITE_OTHER): Payer: 59 | Admitting: Family Medicine

## 2021-10-27 VITALS — BP 130/82 | HR 76 | Ht 61.0 in | Wt 201.0 lb

## 2021-10-27 DIAGNOSIS — R700 Elevated erythrocyte sedimentation rate: Secondary | ICD-10-CM | POA: Diagnosis not present

## 2021-10-27 DIAGNOSIS — D508 Other iron deficiency anemias: Secondary | ICD-10-CM | POA: Diagnosis not present

## 2021-10-27 DIAGNOSIS — R7989 Other specified abnormal findings of blood chemistry: Secondary | ICD-10-CM | POA: Diagnosis not present

## 2021-10-27 DIAGNOSIS — R69 Illness, unspecified: Secondary | ICD-10-CM | POA: Diagnosis not present

## 2021-10-27 DIAGNOSIS — D509 Iron deficiency anemia, unspecified: Secondary | ICD-10-CM

## 2021-10-27 DIAGNOSIS — E538 Deficiency of other specified B group vitamins: Secondary | ICD-10-CM

## 2021-10-27 DIAGNOSIS — M2550 Pain in unspecified joint: Secondary | ICD-10-CM | POA: Diagnosis not present

## 2021-10-27 DIAGNOSIS — E213 Hyperparathyroidism, unspecified: Secondary | ICD-10-CM | POA: Diagnosis not present

## 2021-10-27 DIAGNOSIS — E042 Nontoxic multinodular goiter: Secondary | ICD-10-CM | POA: Diagnosis not present

## 2021-10-27 DIAGNOSIS — R202 Paresthesia of skin: Secondary | ICD-10-CM

## 2021-10-27 DIAGNOSIS — F4323 Adjustment disorder with mixed anxiety and depressed mood: Secondary | ICD-10-CM

## 2021-10-27 DIAGNOSIS — E539 Vitamin B deficiency, unspecified: Secondary | ICD-10-CM

## 2021-10-27 DIAGNOSIS — S060X0D Concussion without loss of consciousness, subsequent encounter: Secondary | ICD-10-CM | POA: Diagnosis not present

## 2021-10-27 HISTORY — DX: Iron deficiency anemia, unspecified: D50.9

## 2021-10-27 HISTORY — DX: Hyperparathyroidism, unspecified: E21.3

## 2021-10-27 MED ORDER — CYANOCOBALAMIN 1000 MCG/ML IJ SOLN: 1000.0000 ug | Freq: Once | INTRAMUSCULAR | 0 refills | Status: DC

## 2021-10-27 MED ORDER — CYANOCOBALAMIN 1000 MCG/ML IJ SOLN
1000.0000 ug | Freq: Once | INTRAMUSCULAR | Status: AC
  Administered 2021-10-27: 1000 ug via INTRAMUSCULAR

## 2021-10-27 NOTE — Assessment & Plan Note (Signed)
At this point I do feel that this is completely resolved.  Likely the underlying systemic issues as detailed with the hyperparathyroidism, hypercalcemia, and iron deficiency more likely contributing to the patient's continued difficulties.

## 2021-10-27 NOTE — Assessment & Plan Note (Signed)
Lab Results  Component Value Date   PTH 78 (H) 09/20/2021   CALCIUM 10.6 (H) 09/20/2021   CALCIUM 10.6 (H) 09/20/2021   CAION 5.82 (H) 07/05/2021   PHOS 2.5 12/31/2020

## 2021-10-27 NOTE — Assessment & Plan Note (Signed)
Could be secondary to the hyperparathyroidism.  Patient though is responding well to the small dose of Effexor.  Will be seeing behavioral health for some of the other issues.

## 2021-10-27 NOTE — Assessment & Plan Note (Signed)
Found to have low B12 but still within normal range.  Given a B12 injection today.  Patient tolerated it well but did not have a metal taste in her mouth.  Patient stayed around for 15 minutes and drink water.  No other signs or symptoms.  Discussed with patient to hold on oral supplementation until we figure out the other things but likely will be needed in the long run.

## 2021-10-27 NOTE — Assessment & Plan Note (Addendum)
Patient has not made any significant improvement.  Continues to have hypercalcemia with continuing elevations of the PTH.  At this point I do believe patient has primary hyperparathyroidism and is not making any significant improvement.  Patient has not taken any calcific changes, but continues vitamin D supplementation and is not making significant strides either.  We discussed different treatment options but patient continues to have the debilitating nonspecific symptoms of fatigue, memory impairment, mild depression and anxiety that could all be contributed with the hyperparathyroidism.  I will refer patient to general surgery and do think the patient is a surgical candidate.  But would like her to discuss again with endocrinology.  Patient has a follow-up with endocrinology at the beginning of next month.

## 2021-10-27 NOTE — Patient Instructions (Signed)
B12 injection Referral to General Surgery Dr. Harlow Asa I think fibroids need to be removed too See endocrinology Continue Effexor See behavioral health Keep [place] updated through [place] about what other physicians are thinking

## 2021-10-27 NOTE — Assessment & Plan Note (Addendum)
Patient does have fibroids.  Is looking at a possible hysterectomy.  Could also be contributing to some of her symptoms.  Patient will follow up with her OB/GYN.  Additional suggestion was possible iron infusions.

## 2021-10-27 NOTE — Assessment & Plan Note (Signed)
Lab Results  Component Value Date   ESRSEDRATE 52 (H) 12/21/2020

## 2021-11-01 ENCOUNTER — Ambulatory Visit (INDEPENDENT_AMBULATORY_CARE_PROVIDER_SITE_OTHER): Payer: 59 | Admitting: Psychology

## 2021-11-01 DIAGNOSIS — F4323 Adjustment disorder with mixed anxiety and depressed mood: Secondary | ICD-10-CM

## 2021-11-01 DIAGNOSIS — R69 Illness, unspecified: Secondary | ICD-10-CM | POA: Diagnosis not present
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
## 2021-11-16 NOTE — Progress Notes (Signed)
Name: Audrey [name]  MRN/ DOB: [id number], 27-Feb-1976    Age/ Sex: 46 y.o., female     PCP: Lin Landsman, MD   Reason for Endocrinology Evaluation: [place]     Initial Endocrinology Clinic Visit: 08/28/2020    PATIENT IDENTIFIER: Audrey [name] is a 46 y.o., female with a past medical history of Asthma and MNG. She has followed with [facility] since 08/28/2020 for consultative assistance with management of her MNG.   HISTORICAL SUMMARY:   She has been diagnosed with thyroid nodules in 2019 , this was noted again during carotid ultrasound, which prompted a thyroid  ultrasound revealing multiple thyroid nodules but none met criteria for a biopsy.   Endoscopy 2019 showing acid reflux , benign polyps, stomach irritation    HYPERCALCEMIA HISTORY: She was noted with a corrected  serum calcium of 10.9 mg/dL in 10/2020 . Repeat serum calcium confirmed elevated by 12/2020 at 10.4 and elevated ionized calcium 5.62 mg/dL. PTH inappropriately normal    DXA normal 05/2021 24- hr urine calcium low- normal  at 36  mg 06/2021  SUBJECTIVE:     Today (11/17/2021):  Audrey [name] is here for hypercalcemia and MNG.   She has noted increase neck size with pain on the left associated with dysphagia , to where she has to put pressure on the anterior part of her neck to be able to swallow. Has chronic polydipsia , improved polyuria  Denies constipation , has occasional intermittent diarrhea  She has cramps in hands and fingers   She started having B12 injections through Dr. Thompson Caul office, she would like that to be checked today   She is on Vitamin D 3000 iu daily     HISTORY:  Past Medical History:  Past Medical History:  Diagnosis Date   Abortion history    Anemia    Asthma    Fibroid tumor    Hypertension    Migraine headache    Multiple thyroid nodules    Shingles    Past Surgical History:  Past Surgical History:  Procedure Laterality Date   CESAREAN  SECTION     COLONOSCOPY, ESOPHAGOGASTRODUODENOSCOPY (EGD) AND ESOPHAGEAL DILATION     CYSTECTOMY     breast   Social History:  reports that she has never smoked. She has never used smokeless tobacco. She reports current alcohol use. She reports that she does not use drugs. Family History:  Family History  Problem Relation Age of Onset   Hypertension Father        Living, 19   High Cholesterol Father    Hypertension Mother    Non-Hodgkin's lymphoma Mother        Deceased, 76   Cancer Maternal Grandfather    Cancer Paternal Grandfather    Heart disease Maternal Uncle    Heart disease Paternal Uncle    Healthy Brother    Asthma Son    Asthma Daughter      HOME MEDICATIONS: Allergies as of 11/17/2021       Reactions   Macrolides And Ketolides Other (See Comments)   Made eye swelling worse   Penicillins Other (See Comments), Itching, Rash   Eye swelling   Shellfish Allergy Anaphylaxis   Advair Diskus [fluticasone-salmeterol] Nausea Only   Weakness headaches   Erythromycin Other (See Comments)   Made eye infection worse   Lisinopril Other (See Comments), Swelling   Caused lips to swell   Nyamyc [nystatin] Other (See Comments)   Made eye  infection worse   Almond (diagnostic) Other (See Comments)   Stomach pain   Diphenhydramine Hcl (sleep) Other (See Comments)   Neomycin Swelling   Asa [aspirin] Nausea Only   Latex Rash        Medication List        Accurate as of November 17, 2021  2:14 PM. If you have any questions, ask your nurse or doctor.          acetaminophen 500 MG tablet Commonly known as: TYLENOL Take 1,000 mg by mouth every 6 (six) hours as needed for mild pain.   Advair HFA 115-21 MCG/ACT inhaler Generic drug: fluticasone-salmeterol Inhale 2 puffs into the lungs 2 (two) times daily.   albuterol 108 (90 Base) MCG/ACT inhaler Commonly known as: VENTOLIN HFA Inhale 2 puffs into the lungs every 6 (six) hours as needed for wheezing or  shortness of breath. For shortness of breath   cloNIDine 0.2 MG tablet Commonly known as: CATAPRES Take 0.2 mg by mouth daily.   cyanocobalamin 1000 MCG/ML injection Commonly known as: (VITAMIN B-12) Inject 1,000 mcg into the muscle once.   EPINEPHrine 0.3 mg/0.3 mL Soaj injection Commonly known as: EPI-PEN Inject 0.3 mg into the muscle as needed for anaphylaxis.   nystatin ointment Commonly known as: MYCOSTATIN Apply topically.   Qulipta 60 MG Tabs Generic drug: Atogepant Take 60 mg by mouth daily.   triamterene-hydrochlorothiazide 37.5-25 MG tablet Commonly known as: MAXZIDE-25 Take 1 tablet by mouth daily.   venlafaxine XR 37.5 MG 24 hr capsule Commonly known as: Effexor XR Take 1 capsule (37.5 mg total) by mouth daily with breakfast.   Vitamin D3 50 MCG (2000 UT) capsule Take 2,000 Units by mouth daily.          OBJECTIVE:   PHYSICAL EXAM: VS: BP (!) 144/92 (BP Location: Left Arm, Patient Position: Sitting, Cuff Size: Small)    Pulse 86    Ht '5\' 1"'  (1.549 m)    Wt 211 lb (95.7 kg)    SpO2 99%    BMI 39.87 kg/m    EXAM: General: Pt appears well and is in NAD  Neck: General: Supple without adenopathy. Thyroid: Thyroid size normal.  Unable to appreciate nodules   Lungs: Clear with good BS bilat with no rales, rhonchi, or wheezes  Heart: Auscultation: RRR.  Abdomen: Normoactive bowel sounds, soft, nontender, without masses or organomegaly palpable  Extremities:  BL LE: No pretibial edema normal ROM and strength.     DATA REVIEWED:    Latest Reference Range & Units 11/17/21 14:47  Sodium 135 - 145 mEq/L 137  Potassium 3.5 - 5.1 mEq/L 3.9  Chloride 96 - 112 mEq/L 101  CO2 19 - 32 mEq/L 31  Glucose 70 - 99 mg/dL 72  BUN 6 - 23 mg/dL 21  Creatinine 0.40 - 1.20 mg/dL 1.03  Calcium 8.4 - 10.5 mg/dL 10.6 (H)  Albumin 3.5 - 5.2 g/dL 4.0  GFR >60.00 mL/min 64.63    Thyroid Ultrasound 07/21/2021  Estimated total number of nodules >/= 1 cm: 2   Number  of spongiform nodules >/=  2 cm not described below (TR1): 0   Number of mixed cystic and solid nodules >/= 1.5 cm not described below ([place]): 0   _________________________________________________________   Nodule 4: 0.9 x 0.7 x 0.7 cm solid hypoechoic nodule in the inferior left thyroid lobe does not meet criteria for FNA or imaging surveillance. It is not significantly changed in size since the prior examination.  Remaining cystic nodules also do not meet criteria for FNA or imaging surveillance.   IMPRESSION: Multinodular goiter. Bilateral thyroid nodules are not significantly changed since prior exam and do not meet criteria for FNA or imaging surveillance.  ASSESSMENT / PLAN / RECOMMENDATIONS:    1. Hyperparathyroidism   - Pt with PTH mediated hypercalcemia  -Patient continues with hypercalcemia, PTH inappropriately normal  -24-hour urinary collection of calcium was low at 36 mg - DXA normal 05/2021 -At this time there is no indication for surgical intervention -We will continue to monitor   Recommendations:  - Stay hydrated with regular water  - Consume 2-3 servings of calcium in the diet  -Continue vitamin D3 3000 IUs daily     2.Multinodular goiter:    -Patient with occasional neck symptoms, she is requesting surgical intervention.  Based on my professional opinion and looking at her thyroid ultrasound which shows the largest thyroid nodule at 1.5 cm at the right inferior, I cannot completely explain her symptoms by multinodular goiter -I did explain to the patient that most of her thyroid nodules are subcentimeter and would not explain the severity of her symptoms -I did bring to her attention the fact that she has been diagnosed with stomach irritation, acid reflux and polyp and this is another differential in her symptoms but she is pretty convinced that her GERD has resolved and it is not related -I am going to refer her to ear nose and throat specialist for  a second opinion      3. Vitamin D Deficiency :  -Resolved   Continue Vitamin D3 3000 iu daily   4.  Low vitamin B12  -Repeat levels are normal -Patient to continue management through Dr. Thompson Caul office   F/U in 6 months   Signed electronically by: Mack Guise, MD  [facility]  [address] [address] [place], [place] [address] Phone: [phone] FAX: [phone]      CC: Lin Landsman, [place] [address] Phone: [phone]  Fax: [phone]   Return to Endocrinology clinic as below: Future Appointments  Date Time Provider [place]  12/01/2021  7:30 AM Oren Binet, PhD [facility] None  12/15/2021  7:30 AM Oren Binet, PhD [facility] None

## 2021-11-17 ENCOUNTER — Other Ambulatory Visit: Payer: Self-pay

## 2021-11-17 ENCOUNTER — Encounter: Payer: Self-pay | Admitting: Internal Medicine

## 2021-11-17 ENCOUNTER — Telehealth: Payer: Self-pay | Admitting: Family Medicine

## 2021-11-17 ENCOUNTER — Ambulatory Visit (INDEPENDENT_AMBULATORY_CARE_PROVIDER_SITE_OTHER): Payer: 59 | Admitting: Psychology

## 2021-11-17 ENCOUNTER — Ambulatory Visit (INDEPENDENT_AMBULATORY_CARE_PROVIDER_SITE_OTHER): Payer: 59 | Admitting: Internal Medicine

## 2021-11-17 VITALS — BP 144/92 | HR 86 | Ht 61.0 in | Wt 211.0 lb

## 2021-11-17 DIAGNOSIS — E559 Vitamin D deficiency, unspecified: Secondary | ICD-10-CM

## 2021-11-17 DIAGNOSIS — R69 Illness, unspecified: Secondary | ICD-10-CM | POA: Diagnosis not present

## 2021-11-17 DIAGNOSIS — E213 Hyperparathyroidism, unspecified: Secondary | ICD-10-CM

## 2021-11-17 DIAGNOSIS — F4323 Adjustment disorder with mixed anxiety and depressed mood: Secondary | ICD-10-CM

## 2021-11-17 DIAGNOSIS — E042 Nontoxic multinodular goiter: Secondary | ICD-10-CM

## 2021-11-17 LAB — BASIC METABOLIC PANEL
BUN: 21 mg/dL (ref 6–23)
CO2: 31 mEq/L (ref 19–32)
Calcium: 10.6 mg/dL — ABNORMAL HIGH (ref 8.4–10.5)
Chloride: 101 mEq/L (ref 96–112)
Creatinine, Ser: 1.03 mg/dL (ref 0.40–1.20)
GFR: 64.63 mL/min (ref >60.00)
Glucose, Bld: 72 mg/dL (ref 70–99)
Potassium: 3.9 mEq/L (ref 3.5–5.1)
Sodium: 137 mEq/L (ref 135–145)

## 2021-11-17 LAB — ALBUMIN: Albumin: 4 g/dL (ref 3.5–5.2)

## 2021-11-17 LAB — VITAMIN D 25 HYDROXY (VIT D DEFICIENCY, FRACTURES): VITD: 34.24 ng/mL (ref 30.00–100.00)

## 2021-11-17 LAB — VITAMIN B12: Vitamin B-12: 494 pg/mL (ref 211–911)

## 2021-11-17 NOTE — Telephone Encounter (Signed)
Pt stopped by to leave the following msg:  Pt needs a disability rating for her lawyer, not to file for disability, just to have the info regarding what % of normal she currently is. They need to know what her current state is and where you are lacking  This can be done in letter form.  Also, she recd the B12 but it is in an injection form. Would we do these for her or should she go to PCP.  Does pt need to recheck with [place]? I did not see any recheck recommendation on the last AVS.
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
## 2021-11-18 ENCOUNTER — Ambulatory Visit: Payer: 59 | Admitting: Internal Medicine

## 2021-11-18 LAB — PARATHYROID HORMONE, INTACT (NO CA): PTH: 54 pg/mL (ref 16–77)
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
## 2021-11-18 NOTE — Telephone Encounter (Signed)
Letter written and patient notified.  

## 2021-11-19 NOTE — Telephone Encounter (Signed)
Appointment scheduled.

## 2021-11-24 ENCOUNTER — Encounter: Payer: Self-pay | Admitting: Family Medicine

## 2021-12-01 ENCOUNTER — Ambulatory Visit (INDEPENDENT_AMBULATORY_CARE_PROVIDER_SITE_OTHER): Payer: 59 | Admitting: Psychology

## 2021-12-01 DIAGNOSIS — F4323 Adjustment disorder with mixed anxiety and depressed mood: Secondary | ICD-10-CM
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
## 2021-12-02 ENCOUNTER — Other Ambulatory Visit: Payer: Self-pay

## 2021-12-02 ENCOUNTER — Ambulatory Visit (INDEPENDENT_AMBULATORY_CARE_PROVIDER_SITE_OTHER): Payer: 59

## 2021-12-02 DIAGNOSIS — E538 Deficiency of other specified B group vitamins: Secondary | ICD-10-CM

## 2021-12-02 MED ORDER — CYANOCOBALAMIN 1000 MCG/ML IJ SOLN
1000.0000 ug | Freq: Once | INTRAMUSCULAR | Status: AC
  Administered 2021-12-02: 1000 ug via INTRAMUSCULAR

## 2021-12-02 NOTE — Progress Notes (Signed)
Patient arrived with own B12 medication to be given. Patient was given the B12 injection in L deltoid and patient tolerated injection well.

## 2021-12-02 NOTE — Telephone Encounter (Signed)
Pt came by this morning to have her B12 injection and would like a response to this MyChart msg.  Also, not sure how to continue with B12 as she was sent in 1 injection, which we administered this morning as she will not self inject. Should she continue shots here with our supply?

## 2021-12-15 ENCOUNTER — Ambulatory Visit (INDEPENDENT_AMBULATORY_CARE_PROVIDER_SITE_OTHER): Payer: 59 | Admitting: Psychology

## 2021-12-15 DIAGNOSIS — F4323 Adjustment disorder with mixed anxiety and depressed mood: Secondary | ICD-10-CM

## 2021-12-15 NOTE — Progress Notes (Signed)
? ? ?  [place] Counselor/Therapist Progress Note ? ?Patient ID: Audrey [name], MRN: [id number]   ? ?Date: 12/15/21 ? ?Time Spent: 7:35  am - 8:20 am : 45 Minutes ? ?Treatment Type: Individual Therapy. ? ?Reported Symptoms: anxiety, stress, depression ? ?Mental Status Exam: ?Appearance:  Well Groomed     ?Behavior: Appropriate  ?Motor: Normal  ?Speech/Language:  Normal Rate  ?Affect: Appropriate  ?Mood: normal  ?Thought process: normal  ?Thought content:   WNL  ?Sensory/Perceptual disturbances:   WNL  ?Orientation: oriented to person, place, time/date, and situation  ?Attention: Good  ?Concentration: Good  ?Memory: WNL  ?Fund of knowledge:  Good  ?Insight:   Good  ?Judgment:  Good  ?Impulse Control: Good  ? ?Risk Assessment: ?Danger to Self:  No ?Self-injurious Behavior: No ?Danger to Others: No ?Duty to Warn:no ?Physical Aggression / Violence:No  ?Access to Firearms a concern: No  ?Gang Involvement:No  ? ?Treatment Plan:  ? ?Audrey [name] participated from home, via video, and consented to treatment. Therapist participated from home office. We met online due to [place] pandemic.  ? ? ? ?Interventions: insight oriented therapy, CBT ?Diagnosis: Adjustment Disorder with Anxiety and Depression ? ? ?Plan: Patient is to use CBT, mindfulness and coping skills to help manage decrease symptoms associated with her adjustment disorder diagnosis. ?  ?Long-term goal:   ?Reduce overall level, frequency, and intensity of the feelings of depression, anxiety and panic evidenced by self-reported decreased nervousness, negative self talk, sadness and helpless feelings. Goal Date 12-23 ? ?Short-term goal:  ?Verbally express understanding of the relationship between feelings of depression, anxiety and their impact on her thinking patterns and behaviors. Develop behavioral strategies to manage anxiety/fears.Goal Date 6-23 ? ? ?Session note: ?Audrey [name] states that she is "stable" and no notable changes. Has appointment with  Dr. Tomi Likens in mid March (15th). No change with her memory struggles. 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  ?She is hoping she can reduce the amount of medicine she takes, including blood pressure meds. Her thyroid is problematic and may be a factor with her blood pressure. Again, suggested that she consult with her physician. Emotional states are stable with no significant anxiety or depression reported. Compensatory strategies to deal with memory loss at work has been effective. Will continue to use strategies. Will talk about hoe to use sessions after meeting with Dr. Tomi Likens.     ?           ? ? ?Marcelina Morel, PhD Time: 55 minutes 7:35a-8:20a ? ?  ? ? ? ? ? ? ? ? ? ? ? ? ? ? ? ?

## 2021-12-26 ENCOUNTER — Other Ambulatory Visit: Payer: Self-pay | Admitting: Family Medicine
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
## 2021-12-29 ENCOUNTER — Encounter: Payer: Self-pay | Admitting: Psychology

## 2021-12-29 ENCOUNTER — Encounter: Payer: Self-pay | Admitting: Neurology

## 2021-12-29 ENCOUNTER — Ambulatory Visit (INDEPENDENT_AMBULATORY_CARE_PROVIDER_SITE_OTHER): Payer: 59 | Admitting: Neurology

## 2021-12-29 ENCOUNTER — Other Ambulatory Visit: Payer: Self-pay

## 2021-12-29 VITALS — BP 133/88 | HR 93 | Ht 64.0 in | Wt 208.0 lb

## 2021-12-29 DIAGNOSIS — R4189 Other symptoms and signs involving cognitive functions and awareness: Secondary | ICD-10-CM

## 2021-12-29 DIAGNOSIS — Z87820 Personal history of traumatic brain injury: Secondary | ICD-10-CM

## 2021-12-29 DIAGNOSIS — R1033 Periumbilical pain: Secondary | ICD-10-CM | POA: Insufficient documentation

## 2021-12-29 DIAGNOSIS — I781 Nevus, non-neoplastic: Secondary | ICD-10-CM | POA: Insufficient documentation

## 2021-12-29 DIAGNOSIS — I10 Essential (primary) hypertension: Secondary | ICD-10-CM | POA: Insufficient documentation

## 2021-12-29 DIAGNOSIS — K5900 Constipation, unspecified: Secondary | ICD-10-CM | POA: Insufficient documentation

## 2021-12-29 DIAGNOSIS — G43909 Migraine, unspecified, not intractable, without status migrainosus: Secondary | ICD-10-CM | POA: Insufficient documentation
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
## 2021-12-29 NOTE — Patient Instructions (Signed)
I will refer you to Dr. Melvyn Novas for neuropsychological evaluation ?

## 2021-12-30 ENCOUNTER — Ambulatory Visit: Payer: 59

## 2021-12-30 ENCOUNTER — Ambulatory Visit (INDEPENDENT_AMBULATORY_CARE_PROVIDER_SITE_OTHER): Payer: 59 | Admitting: Psychology

## 2021-12-30 ENCOUNTER — Other Ambulatory Visit: Payer: Self-pay | Admitting: Psychology

## 2021-12-30 ENCOUNTER — Encounter: Payer: Self-pay | Admitting: Psychology

## 2021-12-30 DIAGNOSIS — R4189 Other symptoms and signs involving cognitive functions and awareness: Secondary | ICD-10-CM | POA: Diagnosis not present

## 2021-12-30 DIAGNOSIS — F4323 Adjustment disorder with mixed anxiety and depressed mood: Secondary | ICD-10-CM

## 2021-12-30 DIAGNOSIS — S060X0S Concussion without loss of consciousness, sequela: Secondary | ICD-10-CM | POA: Diagnosis not present

## 2021-12-30 NOTE — Progress Notes (Signed)
? ?  Psychometrician Note ?  ?Cognitive testing was administered to [employer] by Cruzita Lederer, B.S. (psychometrist) under the supervision of Dr. Christia Reading, Ph.D., licensed psychologist on 12/30/2021. Ms. Klinkner did not appear overtly distressed by the testing session per behavioral observation or responses across self-report questionnaires. Rest breaks were offered.  ?  ?The battery of tests administered was selected by Dr. Christia Reading, Ph.D. with consideration to Ms. Achille's current level of functioning, the nature of her symptoms, emotional and behavioral responses during interview, level of literacy, observed level of motivation/effort, and the nature of the referral question. This battery was communicated to the psychometrist. Communication between Dr. Christia Reading, Ph.D. and the psychometrist was ongoing throughout the evaluation and Dr. Christia Reading, Ph.D. was immediately accessible at all times. Dr. Christia Reading, Ph.D. provided supervision to the psychometrist on the date of this service to the extent necessary to assure the quality of all services provided.  ?  ?BREANDA GREENLAW will return within approximately 1-2 weeks for an interactive feedback session with Dr. Melvyn Novas at which time her test performances, clinical impressions, and treatment recommendations will be reviewed in detail. Ms. Ortman understands she can contact our office should she require our assistance before this time. ? ?A total of 190 minutes of billable time were spent face-to-face with Ms. Zachow by the psychometrist. This includes both test administration and scoring time. Billing for these services is reflected in the clinical report generated by Dr. Christia Reading, Ph.D. ? ?This note reflects time spent with the psychometrician and does not include test scores or any clinical interpretations made by Dr. Melvyn Novas. The full report will follow in a separate note.  ?

## 2022-01-03 ENCOUNTER — Ambulatory Visit (INDEPENDENT_AMBULATORY_CARE_PROVIDER_SITE_OTHER): Payer: 59 | Admitting: Psychology

## 2022-01-03 DIAGNOSIS — S060X0S Concussion without loss of consciousness, sequela: Secondary | ICD-10-CM

## 2022-01-03 DIAGNOSIS — R4189 Other symptoms and signs involving cognitive functions and awareness: Secondary | ICD-10-CM

## 2022-01-03 DIAGNOSIS — F4323 Adjustment disorder with mixed anxiety and depressed mood: Secondary | ICD-10-CM

## 2022-01-03 NOTE — Progress Notes (Signed)
? ?  Neuropsychology Feedback Session ?[place] ?[place] Department of Neurology ? ?  ? ?Audrey [name] did not show for her feedback appointment scheduled at 3:30pm on 01/03/2022. Her report is available for her to review on MyChart. She will need to call the office to re-schedule this appointment if desired.  ? ?  ? ?

## 2022-01-06 ENCOUNTER — Encounter: Payer: 59 | Admitting: Psychology

## 2022-01-10 ENCOUNTER — Ambulatory Visit (INDEPENDENT_AMBULATORY_CARE_PROVIDER_SITE_OTHER): Payer: 59 | Admitting: Psychology

## 2022-01-10 ENCOUNTER — Other Ambulatory Visit: Payer: Self-pay

## 2022-01-10 DIAGNOSIS — F4323 Adjustment disorder with mixed anxiety and depressed mood: Secondary | ICD-10-CM | POA: Diagnosis not present

## 2022-01-10 DIAGNOSIS — S060X0S Concussion without loss of consciousness, sequela: Secondary | ICD-10-CM | POA: Diagnosis not present

## 2022-01-10 DIAGNOSIS — R4189 Other symptoms and signs involving cognitive functions and awareness: Secondary | ICD-10-CM
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
## 2022-04-07 DIAGNOSIS — I10 Essential (primary) hypertension: Secondary | ICD-10-CM | POA: Diagnosis not present

## 2022-04-07 DIAGNOSIS — Z760 Encounter for issue of repeat prescription: Secondary | ICD-10-CM | POA: Diagnosis not present

## 2022-04-07 DIAGNOSIS — Z1379 Encounter for other screening for genetic and chromosomal anomalies: Secondary | ICD-10-CM | POA: Diagnosis not present

## 2022-04-07 DIAGNOSIS — D500 Iron deficiency anemia secondary to blood loss (chronic): Secondary | ICD-10-CM | POA: Diagnosis not present

## 2022-05-04 ENCOUNTER — Ambulatory Visit (INDEPENDENT_AMBULATORY_CARE_PROVIDER_SITE_OTHER): Payer: No Typology Code available for payment source | Admitting: Internal Medicine

## 2022-05-04 ENCOUNTER — Encounter: Payer: Self-pay | Admitting: Internal Medicine

## 2022-05-04 VITALS — BP 124/76 | HR 80 | Ht 64.0 in | Wt 215.6 lb

## 2022-05-04 DIAGNOSIS — E213 Hyperparathyroidism, unspecified: Secondary | ICD-10-CM

## 2022-05-04 DIAGNOSIS — E042 Nontoxic multinodular goiter: Secondary | ICD-10-CM
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
## 2022-05-05 LAB — BASIC METABOLIC PANEL
BUN: 20 mg/dL (ref 6–23)
CO2: 28 mEq/L (ref 19–32)
Calcium: 10.7 mg/dL — ABNORMAL HIGH (ref 8.4–10.5)
Chloride: 100 mEq/L (ref 96–112)
Creatinine, Ser: 1.09 mg/dL (ref 0.40–1.20)
GFR: 60.19 mL/min (ref >60.00)
Glucose, Bld: 94 mg/dL (ref 70–99)
Potassium: 3.8 mEq/L (ref 3.5–5.1)
Sodium: 138 mEq/L (ref 135–145)

## 2022-05-05 LAB — TSH: TSH: 1.68 u[IU]/mL (ref 0.35–5.50)

## 2022-05-05 LAB — PARATHYROID HORMONE, INTACT (NO CA): PTH: 49 pg/mL (ref 16–77)

## 2022-05-05 LAB — CALCIUM, IONIZED: Calcium, Ion: 5.7 mg/dL — ABNORMAL HIGH (ref 4.7–5.5)

## 2022-05-05 LAB — T4, FREE: Free T4: 0.93 ng/dL (ref 0.60–1.60)

## 2022-05-05 LAB — VITAMIN D 25 HYDROXY (VIT D DEFICIENCY, FRACTURES): VITD: 25.84 ng/mL — ABNORMAL LOW (ref 30.00–100.00)

## 2022-05-05 LAB — ALBUMIN: Albumin: 4.2 g/dL (ref 3.5–5.2)

## 2022-05-11 ENCOUNTER — Ambulatory Visit: Payer: 59 | Admitting: Internal Medicine

## 2022-07-21 ENCOUNTER — Ambulatory Visit (INDEPENDENT_AMBULATORY_CARE_PROVIDER_SITE_OTHER): Payer: No Typology Code available for payment source | Admitting: Obstetrics & Gynecology

## 2022-07-21 ENCOUNTER — Encounter (HOSPITAL_BASED_OUTPATIENT_CLINIC_OR_DEPARTMENT_OTHER): Payer: Self-pay | Admitting: Obstetrics & Gynecology

## 2022-07-21 ENCOUNTER — Other Ambulatory Visit (HOSPITAL_COMMUNITY)
Admission: RE | Admit: 2022-07-21 | Discharge: 2022-07-21 | Disposition: A | Discharge status: Home / Self Care | Payer: No Typology Code available for payment source | Source: Ambulatory Visit | Attending: Obstetrics & Gynecology | Admitting: Obstetrics & Gynecology

## 2022-07-21 VITALS — BP 137/85 | HR 76 | Ht 61.0 in | Wt 217.4 lb

## 2022-07-21 DIAGNOSIS — N920 Excessive and frequent menstruation with regular cycle: Secondary | ICD-10-CM | POA: Diagnosis not present

## 2022-07-21 DIAGNOSIS — Z124 Encounter for screening for malignant neoplasm of cervix: Secondary | ICD-10-CM | POA: Insufficient documentation

## 2022-07-21 DIAGNOSIS — Z23 Encounter for immunization: Secondary | ICD-10-CM | POA: Diagnosis not present

## 2022-07-21 DIAGNOSIS — Z01419 Encounter for gynecological examination (general) (routine) without abnormal findings: Secondary | ICD-10-CM

## 2022-07-21 DIAGNOSIS — R232 Flushing: Secondary | ICD-10-CM

## 2022-07-21 DIAGNOSIS — Z862 Personal history of diseases of the blood and blood-forming organs and certain disorders involving the immune mechanism: Secondary | ICD-10-CM

## 2022-07-21 DIAGNOSIS — Z1231 Encounter for screening mammogram for malignant neoplasm of breast: Secondary | ICD-10-CM
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
## 2022-07-22 ENCOUNTER — Ambulatory Visit (HOSPITAL_BASED_OUTPATIENT_CLINIC_OR_DEPARTMENT_OTHER)
Admission: RE | Admit: 2022-07-22 | Discharge: 2022-07-22 | Disposition: A | Discharge status: Home / Self Care | Payer: No Typology Code available for payment source | Source: Ambulatory Visit | Attending: Obstetrics & Gynecology | Admitting: Obstetrics & Gynecology

## 2022-07-22 DIAGNOSIS — N920 Excessive and frequent menstruation with regular cycle: Secondary | ICD-10-CM | POA: Insufficient documentation

## 2022-07-22 LAB — ESTRADIOL: Estradiol: 169 pg/mL

## 2022-07-22 LAB — FOLLICLE STIMULATING HORMONE: FSH: 12.4 m[IU]/mL

## 2022-07-25 ENCOUNTER — Other Ambulatory Visit (HOSPITAL_BASED_OUTPATIENT_CLINIC_OR_DEPARTMENT_OTHER): Payer: Self-pay

## 2022-07-25 MED ORDER — NORETHINDRONE 0.35 MG PO TABS: 1.0000 | ORAL_TABLET | Freq: Every day | ORAL | 3 refills | Status: DC

## 2022-07-26 LAB — CYTOLOGY - PAP
Comment: NEGATIVE
Diagnosis: NEGATIVE
Diagnosis: REACTIVE
High risk HPV: NEGATIVE
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
## 2022-09-14 ENCOUNTER — Encounter (HOSPITAL_BASED_OUTPATIENT_CLINIC_OR_DEPARTMENT_OTHER): Payer: Self-pay | Admitting: *Deleted

## 2022-09-18 ENCOUNTER — Other Ambulatory Visit (HOSPITAL_BASED_OUTPATIENT_CLINIC_OR_DEPARTMENT_OTHER): Payer: Self-pay | Admitting: Obstetrics & Gynecology

## 2022-10-12 ENCOUNTER — Ambulatory Visit: Payer: No Typology Code available for payment source | Admitting: Internal Medicine
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
## 2022-10-14 ENCOUNTER — Ambulatory Visit: Payer: No Typology Code available for payment source | Admitting: Internal Medicine

## 2022-10-17 HISTORY — PX: OTHER SURGICAL HISTORY: SHX169

## 2022-11-15 ENCOUNTER — Telehealth (HOSPITAL_BASED_OUTPATIENT_CLINIC_OR_DEPARTMENT_OTHER): Payer: Self-pay | Admitting: *Deleted

## 2022-11-15 NOTE — Telephone Encounter (Signed)
Pt called to let us know that she has been bleeding for 2 weeks despite taking micronor as prescribed.  She is also experiencing pain with the bleeding.  Pt is also seeking recommendations for a pediatric gynecologist for her daughter who also has heavy menstrual cycles. Pt provided with appt.

## 2022-11-16 ENCOUNTER — Encounter: Payer: Self-pay | Admitting: Internal Medicine

## 2022-11-16 ENCOUNTER — Ambulatory Visit: Payer: No Typology Code available for payment source | Admitting: Internal Medicine

## 2022-11-16 VITALS — BP 130/80 | HR 74 | Ht 61.0 in | Wt 210.0 lb

## 2022-11-16 DIAGNOSIS — E213 Hyperparathyroidism, unspecified: Secondary | ICD-10-CM | POA: Diagnosis not present

## 2022-11-16 DIAGNOSIS — E559 Vitamin D deficiency, unspecified: Secondary | ICD-10-CM

## 2022-11-16 DIAGNOSIS — E042 Nontoxic multinodular goiter: Secondary | ICD-10-CM

## 2022-11-16 LAB — BASIC METABOLIC PANEL
BUN: 18 mg/dL (ref 6–23)
CO2: 28 mEq/L (ref 19–32)
Calcium: 10.4 mg/dL (ref 8.4–10.5)
Chloride: 103 mEq/L (ref 96–112)
Creatinine, Ser: 1.05 mg/dL (ref 0.40–1.20)
GFR: 62.72 mL/min (ref >60.00)
Glucose, Bld: 93 mg/dL (ref 70–99)
Potassium: 3.5 mEq/L (ref 3.5–5.1)
Sodium: 137 mEq/L (ref 135–145)

## 2022-11-16 LAB — T4, FREE: Free T4: 0.86 ng/dL (ref 0.60–1.60)

## 2022-11-16 LAB — VITAMIN D 25 HYDROXY (VIT D DEFICIENCY, FRACTURES): VITD: 25.85 ng/mL — ABNORMAL LOW (ref 30.00–100.00)

## 2022-11-16 LAB — ALBUMIN: Albumin: 3.9 g/dL (ref 3.5–5.2)

## 2022-11-16 LAB — TSH: TSH: 1.05 u[IU]/mL (ref 0.35–5.50)

## 2022-11-16 NOTE — Patient Instructions (Signed)
-  Stay hydrated  - Avoid over the counter calcium tablets  - Consume 2-3 servings of calcium in your diet ( low fat dairy, [place] leafy vegetables)
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
## 2022-11-17 LAB — PARATHYROID HORMONE, INTACT (NO CA): PTH: 86 pg/mL — ABNORMAL HIGH (ref 16–77)

## 2022-11-18 ENCOUNTER — Ambulatory Visit (HOSPITAL_BASED_OUTPATIENT_CLINIC_OR_DEPARTMENT_OTHER): Payer: No Typology Code available for payment source | Admitting: Obstetrics & Gynecology

## 2022-11-18 ENCOUNTER — Encounter (HOSPITAL_BASED_OUTPATIENT_CLINIC_OR_DEPARTMENT_OTHER): Payer: Self-pay | Admitting: Obstetrics & Gynecology

## 2022-11-18 ENCOUNTER — Other Ambulatory Visit (HOSPITAL_COMMUNITY)
Admission: RE | Admit: 2022-11-18 | Discharge: 2022-11-18 | Disposition: A | Discharge status: Home / Self Care | Payer: No Typology Code available for payment source | Source: Ambulatory Visit | Attending: Obstetrics & Gynecology | Admitting: Obstetrics & Gynecology

## 2022-11-18 VITALS — BP 138/84 | HR 71 | Ht 61.0 in | Wt 209.0 lb

## 2022-11-18 DIAGNOSIS — D250 Submucous leiomyoma of uterus: Secondary | ICD-10-CM

## 2022-11-18 DIAGNOSIS — N920 Excessive and frequent menstruation with regular cycle: Secondary | ICD-10-CM | POA: Diagnosis present

## 2022-11-18 DIAGNOSIS — E611 Iron deficiency: Secondary | ICD-10-CM

## 2022-11-18 DIAGNOSIS — D251 Intramural leiomyoma of uterus: Secondary | ICD-10-CM | POA: Diagnosis not present
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
## 2022-11-22 LAB — SURGICAL PATHOLOGY

## 2022-12-05 ENCOUNTER — Encounter: Payer: Self-pay | Admitting: *Deleted

## 2022-12-07 ENCOUNTER — Ambulatory Visit
Admission: RE | Admit: 2022-12-07 | Discharge: 2022-12-07 | Disposition: A | Discharge status: Home / Self Care | Payer: No Typology Code available for payment source | Source: Ambulatory Visit | Attending: Internal Medicine | Admitting: Internal Medicine

## 2022-12-07 DIAGNOSIS — E042 Nontoxic multinodular goiter: Secondary | ICD-10-CM

## 2022-12-29 ENCOUNTER — Telehealth (HOSPITAL_BASED_OUTPATIENT_CLINIC_OR_DEPARTMENT_OTHER): Payer: Self-pay | Admitting: Obstetrics & Gynecology

## 2022-12-29 NOTE — Telephone Encounter (Signed)
Patient called and left a message that she has not revived a call to scheduled for surgery.

## 2022-12-30 NOTE — Telephone Encounter (Signed)
LMOVM that I would let surgery scheduler know that she is awaiting a call.

## 2023-01-24 ENCOUNTER — Other Ambulatory Visit (HOSPITAL_BASED_OUTPATIENT_CLINIC_OR_DEPARTMENT_OTHER): Payer: Self-pay | Admitting: Obstetrics & Gynecology

## 2023-01-31 ENCOUNTER — Encounter (HOSPITAL_BASED_OUTPATIENT_CLINIC_OR_DEPARTMENT_OTHER): Payer: Self-pay

## 2023-01-31 ENCOUNTER — Telehealth: Payer: Self-pay

## 2023-01-31 NOTE — Telephone Encounter (Signed)
Called patient to discuss potential surgery dates, no answer, left voicemail, will secure spot of first available 5/8, advised patient to give me a call back if this does not work for her.

## 2023-02-08 ENCOUNTER — Encounter (HOSPITAL_BASED_OUTPATIENT_CLINIC_OR_DEPARTMENT_OTHER): Payer: Self-pay | Admitting: Obstetrics & Gynecology

## 2023-02-09 ENCOUNTER — Encounter (HOSPITAL_BASED_OUTPATIENT_CLINIC_OR_DEPARTMENT_OTHER): Payer: Self-pay | Admitting: Obstetrics & Gynecology

## 2023-02-09 NOTE — Progress Notes (Signed)
Spoke w/ via phone for pre-op interview---Audrey [name] needs dos----  ISTAT, EKG and UPT per anesthesia             [name] results------ COVID test -----patient states asymptomatic no test needed Arrive at -------1130 NPO after MN NO Solid Food.  Clear liquids from MN until---1030 Med rec completed Medications to take morning of surgery -----Advair and albuterol Diabetic medication ----- Patient instructed no nail polish to be worn day of surgery Patient instructed to bring photo id and insurance card day of surgery Patient aware to have Driver (ride ) / caregiver Son Audrey [name] or Audrey [name]   for 24 hours after surgery  Patient Special Instructions ----- Pre-Op special Instructions ----- Patient verbalized understanding of instructions that were given at this phone interview. Patient denies shortness of breath, chest pain, fever, cough at this phone interview.

## 2023-02-14 ENCOUNTER — Ambulatory Visit (HOSPITAL_BASED_OUTPATIENT_CLINIC_OR_DEPARTMENT_OTHER): Payer: No Typology Code available for payment source | Admitting: Obstetrics & Gynecology

## 2023-02-14 ENCOUNTER — Encounter (HOSPITAL_BASED_OUTPATIENT_CLINIC_OR_DEPARTMENT_OTHER): Payer: Self-pay | Admitting: Obstetrics & Gynecology

## 2023-02-14 VITALS — BP 134/74 | HR 79 | Ht 61.0 in | Wt 211.0 lb

## 2023-02-14 DIAGNOSIS — Z862 Personal history of diseases of the blood and blood-forming organs and certain disorders involving the immune mechanism: Secondary | ICD-10-CM

## 2023-02-14 DIAGNOSIS — Z8742 Personal history of other diseases of the female genital tract: Secondary | ICD-10-CM

## 2023-02-14 DIAGNOSIS — D219 Benign neoplasm of connective and other soft tissue, unspecified: Secondary | ICD-10-CM

## 2023-02-14 DIAGNOSIS — R102 Pelvic and perineal pain: Secondary | ICD-10-CM | POA: Diagnosis not present

## 2023-02-14 LAB — POCT URINALYSIS DIPSTICK
Bilirubin, UA: NEGATIVE
Glucose, UA: NEGATIVE
Ketones, UA: NEGATIVE
Leukocytes, UA: NEGATIVE
Nitrite, UA: NEGATIVE
Protein, UA: NEGATIVE
Spec Grav, UA: >=1.03 — AB (ref 1.010–1.025)
Urobilinogen, UA: 0.2 E.U./dL
pH, UA: 5 (ref 5.0–8.0)
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
## 2023-02-15 ENCOUNTER — Ambulatory Visit (HOSPITAL_BASED_OUTPATIENT_CLINIC_OR_DEPARTMENT_OTHER)
Admission: RE | Admit: 2023-02-15 | Discharge: 2023-02-15 | Disposition: A | Discharge status: Home / Self Care | Payer: No Typology Code available for payment source | Source: Ambulatory Visit | Attending: Obstetrics & Gynecology | Admitting: Obstetrics & Gynecology

## 2023-02-15 DIAGNOSIS — Z8742 Personal history of other diseases of the female genital tract: Secondary | ICD-10-CM | POA: Insufficient documentation

## 2023-02-15 DIAGNOSIS — D219 Benign neoplasm of connective and other soft tissue, unspecified: Secondary | ICD-10-CM

## 2023-02-15 LAB — IRON,TIBC AND FERRITIN PANEL
Ferritin: 75 ng/mL (ref 15–150)
Iron Saturation: 18 % (ref 15–55)
Iron: 63 ug/dL (ref 27–159)
Total Iron Binding Capacity: 352 ug/dL (ref 250–450)
UIBC: 289 ug/dL (ref 131–425)

## 2023-02-15 LAB — CBC
Hematocrit: 37.8 % (ref 34.0–46.6)
Hemoglobin: 12.5 g/dL (ref 11.1–15.9)
MCH: 28.5 pg (ref 26.6–33.0)
MCHC: 33.1 g/dL (ref 31.5–35.7)
MCV: 86 fL (ref 79–97)
Platelets: 288 10*3/uL (ref 150–450)
RBC: 4.38 x10E6/uL (ref 3.77–5.28)
RDW: 14.1 % (ref 11.7–15.4)
WBC: 7.4 10*3/uL (ref 3.4–10.8)

## 2023-02-21 ENCOUNTER — Encounter (HOSPITAL_BASED_OUTPATIENT_CLINIC_OR_DEPARTMENT_OTHER): Payer: Self-pay | Admitting: Obstetrics & Gynecology

## 2023-02-21 ENCOUNTER — Other Ambulatory Visit (HOSPITAL_BASED_OUTPATIENT_CLINIC_OR_DEPARTMENT_OTHER): Payer: Self-pay | Admitting: Obstetrics & Gynecology

## 2023-02-21 DIAGNOSIS — D219 Benign neoplasm of connective and other soft tissue, unspecified: Secondary | ICD-10-CM

## 2023-02-21 DIAGNOSIS — Z862 Personal history of diseases of the blood and blood-forming organs and certain disorders involving the immune mechanism: Secondary | ICD-10-CM

## 2023-02-21 DIAGNOSIS — Z8742 Personal history of other diseases of the female genital tract: Secondary | ICD-10-CM

## 2023-02-21 DIAGNOSIS — R102 Pelvic and perineal pain: Secondary | ICD-10-CM

## 2023-02-22 ENCOUNTER — Ambulatory Visit (HOSPITAL_BASED_OUTPATIENT_CLINIC_OR_DEPARTMENT_OTHER)
Admission: RE | Admit: 2023-02-22 | Payer: No Typology Code available for payment source | Source: Home / Self Care | Admitting: Obstetrics & Gynecology

## 2023-02-22 DIAGNOSIS — I10 Essential (primary) hypertension: Secondary | ICD-10-CM

## 2023-02-22 DIAGNOSIS — Z01818 Encounter for other preprocedural examination: Secondary | ICD-10-CM

## 2023-02-22 HISTORY — DX: Leiomyoma of uterus, unspecified: D25.9

## 2023-02-22 SURGERY — RADIOFREQUENCY ABLATION, LEIOMYOMA, UTERUS, TRANSCERVICAL APPROACH, WITH US GUIDANCE
Anesthesia: Choice

## 2023-02-22 NOTE — Addendum Note (Signed)
Addended by: Ina Homes B on: 02/22/2023 09:57 AM   Modules accepted: Orders

## 2023-02-24 ENCOUNTER — Other Ambulatory Visit (HOSPITAL_BASED_OUTPATIENT_CLINIC_OR_DEPARTMENT_OTHER): Payer: Self-pay | Admitting: Obstetrics & Gynecology

## 2023-02-24 ENCOUNTER — Encounter (HOSPITAL_BASED_OUTPATIENT_CLINIC_OR_DEPARTMENT_OTHER): Payer: Self-pay | Admitting: Obstetrics & Gynecology

## 2023-02-24 ENCOUNTER — Other Ambulatory Visit: Payer: Self-pay | Admitting: Obstetrics & Gynecology

## 2023-02-24 DIAGNOSIS — D250 Submucous leiomyoma of uterus: Secondary | ICD-10-CM

## 2023-02-24 DIAGNOSIS — M545 Low back pain, unspecified: Secondary | ICD-10-CM

## 2023-02-28 ENCOUNTER — Ambulatory Visit (INDEPENDENT_AMBULATORY_CARE_PROVIDER_SITE_OTHER): Payer: No Typology Code available for payment source

## 2023-02-28 ENCOUNTER — Encounter: Payer: Self-pay | Admitting: Family Medicine

## 2023-02-28 ENCOUNTER — Ambulatory Visit (INDEPENDENT_AMBULATORY_CARE_PROVIDER_SITE_OTHER): Payer: No Typology Code available for payment source | Admitting: Family Medicine

## 2023-02-28 VITALS — BP 118/72 | HR 67 | Ht 61.0 in | Wt 210.0 lb

## 2023-02-28 DIAGNOSIS — M545 Low back pain, unspecified: Secondary | ICD-10-CM | POA: Diagnosis not present

## 2023-02-28 DIAGNOSIS — E559 Vitamin D deficiency, unspecified: Secondary | ICD-10-CM

## 2023-02-28 DIAGNOSIS — D508 Other iron deficiency anemias: Secondary | ICD-10-CM | POA: Diagnosis not present

## 2023-02-28 MED ORDER — VITAMIN D (ERGOCALCIFEROL) 1.25 MG (50000 UNIT) PO CAPS: 50000.0000 [IU] | ORAL_CAPSULE | ORAL | 0 refills | Status: DC

## 2023-02-28 NOTE — Assessment & Plan Note (Signed)
Significant tightness mostly around the left sacroiliac joint.  Could be a candidate for potential manipulation but at the moment secondary to the tightness we will going to monitor.  Discussed icing regimen and home exercises, discussed which activities to do and which ones to avoid.  Increase activity slowly otherwise.  Follow-up with me again in 6 to 8 weeks

## 2023-02-28 NOTE — Assessment & Plan Note (Signed)
Encouraged her to continue the 2000 daily and start the once weekly vitamin D for the next 12 weeks to see if that could be potentially beneficial.  Encouraged her to continue to follow-up with primary provider for this problem which is endocrinology.

## 2023-02-28 NOTE — Assessment & Plan Note (Signed)
Seems stable but does have a significant large fibroid that needs to be taking care of.  Patient has an MRI scheduled which will allow [place] to see some of the back as well.  Will get x-rays of the back as well for patient's hypercalcemia is one of the concerning factors as well.
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
## 2023-02-28 NOTE — Patient Instructions (Signed)
Xrays today Do prescribed exercises at least 3x a week  See you again in 7-8 weeks

## 2023-03-02 ENCOUNTER — Ambulatory Visit
Admission: RE | Admit: 2023-03-02 | Discharge: 2023-03-02 | Disposition: A | Discharge status: Home / Self Care | Payer: No Typology Code available for payment source | Source: Ambulatory Visit | Attending: Obstetrics & Gynecology | Admitting: Obstetrics & Gynecology

## 2023-03-02 DIAGNOSIS — Z8742 Personal history of other diseases of the female genital tract: Secondary | ICD-10-CM

## 2023-03-02 DIAGNOSIS — D219 Benign neoplasm of connective and other soft tissue, unspecified: Secondary | ICD-10-CM

## 2023-03-02 DIAGNOSIS — Z862 Personal history of diseases of the blood and blood-forming organs and certain disorders involving the immune mechanism: Secondary | ICD-10-CM

## 2023-03-02 DIAGNOSIS — R102 Pelvic and perineal pain: Secondary | ICD-10-CM

## 2023-03-02 MED ORDER — GADOPICLENOL 0.5 MMOL/ML IV SOLN
10.0000 mL | Freq: Once | INTRAVENOUS | Status: AC | PRN
  Administered 2023-03-02: 10 mL via INTRAVENOUS

## 2023-03-03 ENCOUNTER — Other Ambulatory Visit (HOSPITAL_BASED_OUTPATIENT_CLINIC_OR_DEPARTMENT_OTHER): Payer: Self-pay | Admitting: Obstetrics & Gynecology

## 2023-03-07 ENCOUNTER — Ambulatory Visit
Admission: RE | Admit: 2023-03-07 | Discharge: 2023-03-07 | Disposition: A | Discharge status: Home / Self Care | Payer: No Typology Code available for payment source | Source: Ambulatory Visit | Attending: Obstetrics & Gynecology | Admitting: Obstetrics & Gynecology

## 2023-03-07 ENCOUNTER — Other Ambulatory Visit: Payer: Self-pay | Admitting: Interventional Radiology

## 2023-03-07 DIAGNOSIS — D250 Submucous leiomyoma of uterus: Secondary | ICD-10-CM

## 2023-03-07 HISTORY — PX: IR RADIOLOGIST EVAL & MGMT: IMG5224
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
## 2023-03-08 ENCOUNTER — Telehealth: Payer: Self-pay

## 2023-03-08 MED ORDER — KETOROLAC TROMETHAMINE 30 MG/ML IJ SOLN
30.0000 mg | Freq: Once | INTRAMUSCULAR | Status: AC
  Administered 2023-03-09: 30 mg via INTRAVENOUS

## 2023-03-08 MED ORDER — NAPROXEN 500 MG PO TABS: 500.0000 mg | ORAL_TABLET | Freq: Two times a day (BID) | ORAL | 0 refills | Status: AC

## 2023-03-08 MED ORDER — ONDANSETRON HCL 8 MG PO TABS: 8.0000 mg | ORAL_TABLET | Freq: Three times a day (TID) | ORAL | 0 refills | Status: DC | PRN

## 2023-03-08 MED ORDER — KETOROLAC TROMETHAMINE 30 MG/ML IJ SOLN: 30.0000 mg | INTRAMUSCULAR | Status: AC

## 2023-03-08 MED ORDER — DOCUSATE SODIUM 100 MG PO CAPS: 100.0000 mg | ORAL_CAPSULE | Freq: Two times a day (BID) | ORAL | 0 refills | Status: AC

## 2023-03-08 MED ORDER — PROMETHAZINE HCL 12.5 MG PO TABS: 12.5000 mg | ORAL_TABLET | ORAL | 0 refills | Status: DC | PRN

## 2023-03-08 MED ORDER — KETOROLAC TROMETHAMINE 30 MG/ML IJ SOLN
30.0000 mg | Freq: Once | INTRAMUSCULAR | Status: AC
  Administered 2023-03-09: 30 mg via INTRAMUSCULAR

## 2023-03-08 NOTE — Telephone Encounter (Signed)
Pt. Is to take Naproxen 500 mg twice a day for 7 days. Colace 100 mg twice a day for 7 days. Percocet 7.5/325mg every 4 hours for 5 days for pain. Zofran 8mg every 8 hours for three days and then as needed for nausea/vomiting. Phenergan 12.5 mg every four hours as needed for nausea/vomiting.  

## 2023-03-09 ENCOUNTER — Ambulatory Visit
Admission: RE | Admit: 2023-03-09 | Discharge: 2023-03-09 | Disposition: A | Discharge status: Home / Self Care | Payer: No Typology Code available for payment source | Source: Ambulatory Visit | Attending: Interventional Radiology | Admitting: Interventional Radiology

## 2023-03-09 ENCOUNTER — Other Ambulatory Visit: Payer: Self-pay | Admitting: Obstetrics & Gynecology

## 2023-03-09 DIAGNOSIS — D259 Leiomyoma of uterus, unspecified: Secondary | ICD-10-CM

## 2023-03-09 DIAGNOSIS — D250 Submucous leiomyoma of uterus: Secondary | ICD-10-CM

## 2023-03-09 HISTORY — PX: IR FLUORO GUIDED NEEDLE PLC ASPIRATION/INJECTION LOC: IMG2395

## 2023-03-09 HISTORY — PX: IR EMBO TUMOR ORGAN ISCHEMIA INFARCT INC GUIDE ROADMAPPING: IMG5449

## 2023-03-09 MED ORDER — VANCOMYCIN HCL IN DEXTROSE 1-5 GM/200ML-% IV SOLN
1000.0000 mg | INTRAVENOUS | Status: AC
  Administered 2023-03-09: 1000 mg via INTRAVENOUS

## 2023-03-09 MED ORDER — ONDANSETRON HCL 4 MG/2ML IJ SOLN
8.0000 mg | Freq: Once | INTRAMUSCULAR | Status: AC
  Administered 2023-03-09: 8 mg via INTRAVENOUS

## 2023-03-09 MED ORDER — PROMETHAZINE HCL 25 MG PO TABS: 25.0000 mg | ORAL_TABLET | Freq: Once | ORAL | Status: DC | PRN

## 2023-03-09 MED ORDER — HYDROMORPHONE HCL 1 MG/ML IJ SOLN
1.0000 mg | Freq: Once | INTRAMUSCULAR | Status: AC
  Administered 2023-03-09: 1 mg via INTRAVENOUS

## 2023-03-09 MED ORDER — SODIUM CHLORIDE 0.9 % IV SOLN: INTRAVENOUS | Status: DC

## 2023-03-09 MED ORDER — PANTOPRAZOLE SODIUM 40 MG IV SOLR
40.0000 mg | Freq: Once | INTRAVENOUS | Status: AC
  Administered 2023-03-09: 40 mg via INTRAVENOUS

## 2023-03-09 MED ORDER — FENTANYL CITRATE PF 50 MCG/ML IJ SOSY
25.0000 ug | PREFILLED_SYRINGE | INTRAMUSCULAR | Status: DC | PRN
  Administered 2023-03-09 (×2): 25 ug via INTRAVENOUS
  Administered 2023-03-09: 50 ug via INTRAVENOUS
  Administered 2023-03-09: 25 ug via INTRAVENOUS

## 2023-03-09 MED ORDER — MIDAZOLAM HCL 2 MG/2ML IJ SOLN
1.0000 mg | INTRAMUSCULAR | Status: DC | PRN
  Administered 2023-03-09 (×2): 1 mg via INTRAVENOUS
  Administered 2023-03-09 (×2): 0.5 mg via INTRAVENOUS
  Administered 2023-03-09 (×2): 1 mg via INTRAVENOUS

## 2023-03-09 MED ORDER — IOPAMIDOL (ISOVUE-300) INJECTION 61%: 70.0000 mL | Freq: Once | INTRAVENOUS | Status: DC | PRN

## 2023-03-09 MED ORDER — SODIUM CHLORIDE 0.9 % IV SOLN
10.0000 mg | Freq: Once | INTRAVENOUS | Status: AC
  Administered 2023-03-09: 10 mg via INTRAVENOUS

## 2023-03-09 MED ORDER — ACETAMINOPHEN 10 MG/ML IV SOLN
1000.0000 mg | Freq: Once | INTRAVENOUS | Status: AC
  Administered 2023-03-09: 1000 mg via INTRAVENOUS

## 2023-03-09 NOTE — Discharge Instructions (Signed)
Uterine Artery Embolization After Care   What can I expect after the procedure?   After the procedure, it is common to have:   Mild pain or discomfort at the arterial entry site   Uterine Cramping   Cramps can vary in strength from what you would consider to be a bad menstrual cycle all the way up to as severe as labor pains.   The cramping is typically the most severe the afternoon and evening the day of the procedure and begin to improve the next day and each day thereafter.   Vaginal bleeding. Your cycle may become irregular the first several months.   Vaginal discharge. We recommend you wear a panty liner for first 4-6 weeks following your procedure.    Nausea and vomiting.      Follow these instructions at home:   Medicines   Take your medicine exactly as told, at the same time every day. This is vital to helping you with a smooth recovery.   Zofran (ondansetron) is used to prevent nausea before it starts.  You will have a prescription to take 8 mg of this medication every 8 hours.  You should take this even if you don't feel nauseated because it is meant to prevent the nausea from occurring.  Once you get nauseated and start to vomit, you may not be able to keep your other medicines down and your pain can be left untreated.  You can take this with every other dose of the oxycodone/acetaminophen   Naproxen is a non-steroidal anti-inflammatory medicine called and is critical in keeping your inflammation and pain under control.  You must take this every 12 hrs. We recommend 8 am and 8 pm.    Percocet (oxycodone/acetaminophen) is a combination narcotic pain medication with Tylenol.  This is to help with your pain.  Take it every 4 hours regardless of your pain level the first 2 days.  Set an alarm to wake up so you don't miss a dose overnight and get behind on your pain control.  After the first 48 hrs, if your pain is minimal you can take only as needed.    Colace (docusate  sodium) is a stool softener to help prevent constipation.  The pain medications often cause constipation which can be particularly uncomfortable after [place].  We recommend you take this at 8 am and 8 pm with your naproxen.    Phenergan (promethazine) is another medication for nausea.  If you still feel sick to your stomach or vomit despite the Zofran (ondansetron) take this medicine every 8 hours as needed.    Incision care   Leave your bandage on for 24 hrs and keep the area dry   You may remove the bandage after 24 hrs and then shower as normal.    Do not submerge in a bath, pool or hot tub until the small incision is completely healed (5-7 days).   Activity   Do not lift anything that is heavier than 5 lb (2.3 kg)for the first 3 days.     Return to your normal activities after day 5.  Take it slowly and listen to your body. Ask your health care provider what activities are safe for you.   General instructions   Many women find a hot water bottle or heating pad helpful when placed on the lower abdomen.  This is fine to do if it helps your cramps.    Do not use any products that contain nicotine or tobacco.   These products include cigarettes, chewing tobacco, and vaping devices, such as e-cigarettes. These can delay incision healing. If you need help quitting, ask your health care provider.   Do not have sex or put anything in your vagina for at least 14 days.    Do not drink alcohol until your health care provider says it is okay.   Keep all follow-up visits. This is important.      Please contact our office at [phone] for the following symptoms:   You have a fever.   You have more redness, swelling, or pain around your incision.   You have more fluid or blood coming from your incision site.   Your incision feels warm to the touch.   You have pus or a bad smell coming from your incision or vagina.   You have a rash.   You have nausea, or you cannot eat or drink  anything without vomiting.   You have a vaginal discharge that is not getting lighter.   Get help right away if:   You have trouble breathing.   You have chest pain.   You have severe pain in your abdomen, and it does not get better with medicine.   You have leg pain or leg swelling.   You feel dizzy, or you faint.   Do not wait to see if the symptoms will go away.   Do not drive yourself to the hospital.      These symptoms may be an emergency.    Get help right away. Call 911.   Summary   After the procedure, it is common to have cramps, or pain or discomfort at the incision site. You will be given pain medicine.   Follow instructions from your health care provider about how to take care of your incision. Check your incision area every day for signs of infection.   Take over-the-counter and prescription medicines only as told by your health care provider.   Contact your health care provider if you have symptoms of infection or other symptoms that do not get better with treatment.   Thanks for visiting [facility]! 

## 2023-03-09 NOTE — Progress Notes (Signed)
Pt back in nursing recovery area. Pt still drowsy from procedure but will wake up when spoken to. Pt follows commands, talks in complete sentences and has no complaints at this time. Pt will remain in nursing station until discharge.  ?

## 2023-03-14 ENCOUNTER — Other Ambulatory Visit (HOSPITAL_BASED_OUTPATIENT_CLINIC_OR_DEPARTMENT_OTHER): Payer: Self-pay | Admitting: *Deleted

## 2023-03-14 MED ORDER — NORETHINDRONE ACETATE 5 MG PO TABS: 5.0000 mg | ORAL_TABLET | Freq: Two times a day (BID) | ORAL | 0 refills | Status: DC

## 2023-03-21 ENCOUNTER — Other Ambulatory Visit: Payer: Self-pay | Admitting: Interventional Radiology

## 2023-03-21 DIAGNOSIS — D250 Submucous leiomyoma of uterus: Secondary | ICD-10-CM

## 2023-03-22 ENCOUNTER — Emergency Department (HOSPITAL_BASED_OUTPATIENT_CLINIC_OR_DEPARTMENT_OTHER): Payer: No Typology Code available for payment source

## 2023-03-22 ENCOUNTER — Other Ambulatory Visit: Payer: Self-pay

## 2023-03-22 ENCOUNTER — Emergency Department (HOSPITAL_COMMUNITY)
Admission: EM | Admit: 2023-03-22 | Discharge: 2023-03-23 | Disposition: A | Discharge status: Home / Self Care | Payer: No Typology Code available for payment source | Attending: Emergency Medicine | Admitting: Emergency Medicine

## 2023-03-22 ENCOUNTER — Telehealth: Payer: Self-pay

## 2023-03-22 ENCOUNTER — Emergency Department (HOSPITAL_COMMUNITY): Payer: No Typology Code available for payment source

## 2023-03-22 DIAGNOSIS — R531 Weakness: Secondary | ICD-10-CM | POA: Insufficient documentation

## 2023-03-22 DIAGNOSIS — R110 Nausea: Secondary | ICD-10-CM | POA: Insufficient documentation

## 2023-03-22 DIAGNOSIS — Z91040 Latex allergy status: Secondary | ICD-10-CM | POA: Insufficient documentation

## 2023-03-22 DIAGNOSIS — R42 Dizziness and giddiness: Secondary | ICD-10-CM | POA: Insufficient documentation

## 2023-03-22 DIAGNOSIS — M79662 Pain in left lower leg: Secondary | ICD-10-CM | POA: Diagnosis not present

## 2023-03-22 DIAGNOSIS — R519 Headache, unspecified: Secondary | ICD-10-CM | POA: Diagnosis not present

## 2023-03-22 LAB — CBC
HCT: 38.9 % (ref 36.0–46.0)
Hemoglobin: 12.6 g/dL (ref 12.0–15.0)
MCH: 29 pg (ref 26.0–34.0)
MCHC: 32.4 g/dL (ref 30.0–36.0)
MCV: 89.4 fL (ref 80.0–100.0)
Platelets: 299 10*3/uL (ref 150–400)
RBC: 4.35 MIL/uL (ref 3.87–5.11)
RDW: 13.7 % (ref 11.5–15.5)
WBC: 8.4 10*3/uL (ref 4.0–10.5)
nRBC: 0 % (ref 0.0–0.2)

## 2023-03-22 LAB — COMPREHENSIVE METABOLIC PANEL
ALT: 24 U/L (ref 0–44)
AST: 16 U/L (ref 15–41)
Albumin: 3.7 g/dL (ref 3.5–5.0)
Alkaline Phosphatase: 51 U/L (ref 38–126)
Anion gap: 9 (ref 5–15)
BUN: 13 mg/dL (ref 6–20)
CO2: 21 mmol/L — ABNORMAL LOW (ref 22–32)
Calcium: 10.1 mg/dL (ref 8.9–10.3)
Chloride: 105 mmol/L (ref 98–111)
Creatinine, Ser: 0.96 mg/dL (ref 0.44–1.00)
GFR, Estimated: >60 mL/min (ref >60)
Glucose, Bld: 95 mg/dL (ref 70–99)
Potassium: 3.3 mmol/L — ABNORMAL LOW (ref 3.5–5.1)
Sodium: 135 mmol/L (ref 135–145)
Total Bilirubin: 0.6 mg/dL (ref 0.3–1.2)
Total Protein: 7.9 g/dL (ref 6.5–8.1)

## 2023-03-22 LAB — PROTIME-INR
INR: 1.1 (ref 0.8–1.2)
Prothrombin Time: 14.3 seconds (ref 11.4–15.2)

## 2023-03-22 MED ORDER — MECLIZINE HCL 25 MG PO TABS: 25.0000 mg | ORAL_TABLET | Freq: Three times a day (TID) | ORAL | 0 refills | Status: DC | PRN

## 2023-03-22 MED ORDER — MECLIZINE HCL 25 MG PO TABS
50.0000 mg | ORAL_TABLET | Freq: Once | ORAL | Status: AC
  Administered 2023-03-22: 50 mg via ORAL
  Filled 2023-03-22: qty 2

## 2023-03-22 NOTE — Telephone Encounter (Signed)
Spoke with pt. She was being picked up by EMS for lightheadedness, and left pelvic and leg pain. Pt. Had [place] 03/09/23 and reported no complaints until 03/20/23 with [facility] Scheduling. Pt. Called back and said they took her to [facility]. RN informed pt. To please call back in the morning if they do not admit her, because we would still like to see her for her scheduled follow up appointment on 03/23/23. RN will continue to check on pt. If she does not call tomorrow morning.

## 2023-03-22 NOTE — Progress Notes (Signed)
Left lower extremity venous study completed.   Preliminary results relayed to PA.  Please see CV Procedures for preliminary results.  Dicie Edelen, RVT  7:25 PM 03/22/23

## 2023-03-22 NOTE — Telephone Encounter (Signed)
Attempted to call pt. Back 06/03 and 06/04 Left VM [facility] RN

## 2023-03-22 NOTE — ED Provider Notes (Signed)
[place] EMERGENCY DEPARTMENT AT [facility] Provider Note   CSN: [id number] Arrival date & time: 03/22/23  1638     History  Chief Complaint  Patient presents with   Dizziness    Audrey [name] is a 47 y.o. female.  HPI   47 year old female with recent uterine artery embolization presents to the emergency department with ongoing dizziness.  Patient states she woke up feeling weak.  Throughout the day she developed lightheadedness that she describes as a spinning sensation.  Worse with movement but otherwise persistent and not resolved at rest.  She went to an award ceremony and while she was there the dizziness became more severe.  She felt hot and flushed.  When she got home she felt so dizzy that she lost balance and felt nauseous and had to be sat down.  Since then the dizziness has not resolved and continues to be ongoing.  She admits that she has history of vertigo stemming back to a head injury but this feels different.  She also has a mild headache but denies any neck pain.  No associated facial droop, unilateral weakness or speech difficulty.  She has ongoing left lower extremity pain stemming from the surgery which is not acutely changed.  Home Medications Prior to Admission medications   Medication Sig Start Date End Date Taking? Authorizing Provider  acetaminophen (TYLENOL) 500 MG tablet Take 1,000 mg by mouth every 6 (six) hours as needed for mild pain.    [provider]  ADVAIR [facility] 409-81 MCG/ACT inhaler Inhale 2 puffs into the lungs 2 (two) times daily. 06/27/21   [provider]  albuterol (PROVENTIL HFA;VENTOLIN HFA) 108 (90 BASE) MCG/ACT inhaler Inhale 2 puffs into the lungs every 6 (six) hours as needed for wheezing or shortness of breath. For shortness of breath    [provider]  Cholecalciferol (VITAMIN D3) 50 MCG (2000 UT) capsule Take 2,000 Units by mouth daily. Patient not taking: Reported on 03/07/2023 03/22/21   [provider]  cloNIDine (CATAPRES) 0.2 MG tablet Take 0.2 mg by mouth daily. Patient not taking: Reported on 03/07/2023 04/30/20   [provider]  EPINEPHrine 0.3 mg/0.3 mL IJ SOAJ injection Inject 0.3 mg into the muscle as needed for anaphylaxis. Patient not taking: Reported on 03/07/2023    [provider]  norethindrone (AYGESTIN) 5 MG tablet Take 1 tablet (5 mg total) by mouth in the morning and at bedtime. 03/14/23   Jerene Bears, MD  norethindrone (MICRONOR) 0.35 MG tablet TAKE 1 TABLET BY MOUTH EVERY DAY 03/03/23   Jerene Bears, MD  nystatin ointment (MYCOSTATIN) Apply topically. Patient not taking: Reported on 03/07/2023 09/21/20   [provider]  ondansetron (ZOFRAN) 8 MG tablet Take 1 tablet (8 mg total) by mouth every 8 (eight) hours as needed for nausea or vomiting. 03/08/23   Sterling Big, MD  promethazine (PHENERGAN) 12.5 MG tablet Take 1 tablet (12.5 mg total) by mouth every 4 (four) hours as needed for nausea or vomiting. 03/08/23   Sterling Big, MD  triamterene-hydrochlorothiazide (DYAZIDE) 37.5-25 MG capsule Take 1 capsule by mouth daily. Patient not taking: Reported on 03/07/2023 04/07/22   [provider]  Vitamin D, Ergocalciferol, (DRISDOL) 1.25 MG (50000 UNIT) CAPS capsule Take 1 capsule (50,000 Units total) by mouth every 7 (seven) days. 02/28/23   Judi Saa, DO  omeprazole (PRILOSEC) 20 MG capsule Take 1 capsule (20 mg total) by mouth daily. 06/11/18 08/02/19  Kirichenko, Tatyana, PA-C  sucralfate (CARAFATE) 1 g tablet Take 1 tablet (1 g total) by mouth 4 (four) times daily -  with meals and at bedtime. 06/11/18 08/02/19  Kirichenko, Lemont Fillers, PA-C      Allergies    Macrolides and ketolides, Penicillins, Shellfish allergy, Advair diskus [fluticasone-salmeterol], Erythromycin, Lisinopril, Nyamyc [nystatin], Almond (diagnostic), Diphenhydramine hcl (sleep), Asa [aspirin], and Latex    Review of Systems   Review of Systems   Constitutional:  Positive for fatigue. Negative for fever.  Respiratory:  Negative for shortness of breath.   Cardiovascular:  Negative for chest pain.  Gastrointestinal:  Positive for nausea. Negative for abdominal pain, diarrhea and vomiting.  Skin:  Negative for rash.  Neurological:  Positive for dizziness and light-headedness. Negative for syncope, facial asymmetry, speech difficulty, weakness, numbness and headaches.    Physical Exam Updated Vital Signs BP (!) 140/82   Pulse 70   Temp 98.8 F (37.1 C) (Oral)   Resp 18   Ht 5\' 1"  (1.549 m)   Wt 95 kg   LMP  (LMP Unknown)   SpO2 98%   BMI 39.57 kg/m  Physical Exam Vitals and nursing note reviewed.  Constitutional:      General: She is not in acute distress.    Appearance: Normal appearance.  HENT:     Head: Normocephalic.     Mouth/Throat:     Mouth: Mucous membranes are moist.  Eyes:     Pupils: Pupils are equal, round, and reactive to light.  Cardiovascular:     Rate and Rhythm: Normal rate.  Pulmonary:     Effort: Pulmonary effort is normal. No respiratory distress.  Abdominal:     Palpations: Abdomen is soft.     Tenderness: There is no abdominal tenderness.  Musculoskeletal:     Cervical back: No rigidity.  Skin:    General: Skin is warm.  Neurological:     General: No focal deficit present.     Mental Status: She is alert and oriented to person, place, and time. Mental status is at baseline.     Cranial Nerves: No cranial nerve deficit.     Sensory: No sensory deficit.     Motor: No weakness.  Psychiatric:        Mood and Affect: Mood normal.     ED Results / Procedures / Treatments   Labs (all labs ordered are listed, but only abnormal results are displayed) Labs Reviewed  COMPREHENSIVE METABOLIC PANEL - Abnormal; Notable for the following components:      Result Value   Potassium 3.3 (*)    CO2 21 (*)    All other components within normal limits  CBC  PROTIME-INR     EKG None  Radiology VAS [place] LOWER EXTREMITY VENOUS (DVT) (7a-7p)  Result Date: 03/22/2023  Lower Venous DVT Study Patient Name:  Audrey [name]  Date of Exam:   03/22/2023 Medical Rec #: [id number]       Accession #:    [id number] Date of Birth: August 15, 1976       Patient Gender: F Patient Age:   66 years Exam Location:  [facility] Procedure:      VAS [place] LOWER EXTREMITY VENOUS (DVT) Referring Phys: Stevphen Meuse FONDAW --------------------------------------------------------------------------------  Indications: Edema, and Pain.  Limitations: Inability to tolerate compressions and poor ultrasound/tissue interface. Comparison Study: No previous study. Performing Technologist: McKayla Maag RVT, VT  Examination Guidelines: A complete evaluation includes B-mode imaging, spectral Doppler, color Doppler, and power Doppler as  needed of all accessible portions of each vessel. Bilateral testing is considered an integral part of a complete examination. Limited examinations for reoccurring indications may be performed as noted. The reflux portion of the exam is performed with the patient in reverse Trendelenburg.  +-----+---------------+---------+-----------+----------+--------------+ RIGHTCompressibilityPhasicitySpontaneityPropertiesThrombus Aging +-----+---------------+---------+-----------+----------+--------------+ CFV  Full           Yes      Yes                                 +-----+---------------+---------+-----------+----------+--------------+ SFJ  Full                                                        +-----+---------------+---------+-----------+----------+--------------+   +--------+---------------+---------+-----------+----------+--------------------+ LEFT    CompressibilityPhasicitySpontaneityPropertiesThrombus Aging       +--------+---------------+---------+-----------+----------+--------------------+ CFV                    Yes      Yes                  Patent by  color                                                           doppler              +--------+---------------+---------+-----------+----------+--------------------+ SFJ                    Yes      Yes                  Patent by color                                                           doppler              +--------+---------------+---------+-----------+----------+--------------------+ FV Prox                Yes      Yes                  Patent by color                                                           doppler              +--------+---------------+---------+-----------+----------+--------------------+ FV Mid  Full                                                              +--------+---------------+---------+-----------+----------+--------------------+  FV      Full                                                              Distal                                                                    +--------+---------------+---------+-----------+----------+--------------------+ PFV                    Yes      Yes                  Patent by color                                                           doppler              +--------+---------------+---------+-----------+----------+--------------------+ POP     Full           Yes      Yes                                       +--------+---------------+---------+-----------+----------+--------------------+ PTV     Full                                                              +--------+---------------+---------+-----------+----------+--------------------+ PERO    Full                                                              +--------+---------------+---------+-----------+----------+--------------------+    Summary: RIGHT: - No evidence of common femoral vein obstruction.  LEFT: - There is no evidence of deep vein thrombosis in the lower extremity. However,  portions of this examination were limited- see technologist comments above.  - No cystic structure found in the popliteal fossa.  *See table(s) above for measurements and observations.    Preliminary     Procedures Procedures    Medications Ordered in ED Medications  meclizine (ANTIVERT) tablet 50 mg (has no administration in time range)    ED Course/ Medical Decision Making/ A&P                             Medical Decision Making Amount and/or Complexity of Data Reviewed Radiology: ordered.   47 year old female presents emergency department with worsening dizziness/lightheadedness.  She presents now because she became so dizzy she was unable to walk.  History of vertigo in the past.  There is a positional aspect but it is not fully resolved.  Also associated with mild headache but no other acute neurosymptoms.  Vitals are normal and stable on arrival.  Patient also has an additional complaint of left lower extremity pain ongoing since her uterine ablation. EKG is sinus rhythm.   Blood work is reassuring without any acute abnormalities.  Ultrasound of the lower extremities show no blood clot.  After meclizine patient feels improved and has been able to ambulate to the bathroom.  My high suspicion is vertigo we are pending MRI for further rule out.  Patient is pending MRI, if this is negative I anticipate prescription for meclizine and outpatient follow-up for what I believe is vertiginous symptoms.  Patient signed out.        Final Clinical Impression(s) / ED Diagnoses Final diagnoses:  None    Rx / DC Orders ED Discharge Orders     None         Rozelle Logan, DO 03/22/23 2323

## 2023-03-22 NOTE — ED Provider Triage Note (Addendum)
Emergency Medicine Provider Triage Evaluation Note  Audrey [name] , a 47 y.o. female  was evaluated in triage.  Pt complains of dizziness.  Pt states she feels light headed and dizzy. Went to [employer] and felt hot and tired and LH.   Went home and lost balance and fell. She was stuttering some and felt very weak and SOB w some nausea.   Recent uterine artery embolization Has been having vaginal bleeding - on progestin (no estrogen)   Review of Systems  Positive: LH Negative: Fever   Physical Exam  BP (!) 152/86 (BP Location: Left Arm)   Pulse 75   Temp 98.8 F (37.1 C) (Oral)   Resp 18   Ht 5\' 1"  (1.549 m)   Wt 95 kg   LMP  (LMP Unknown)   SpO2 99%   BMI 39.57 kg/m  Gen:   Awake, no distress  Resp:  Normal effort  MSK:   Moves extremities without difficulty  Other:  Well appearing  Medical Decision Making  Medically screening exam initiated at 6:11 PM.  Appropriate orders placed.  Audrey [name] was informed that the remainder of the evaluation will be completed by another provider, this initial triage assessment does not replace that evaluation, and the importance of remaining in the ED until their evaluation is complete.  Labs, EKG   Solon [place], [place] 03/22/23 1816   Alos has some L leg pain no swelling but will obtain DVT study    Gailen Shelter, [place] 03/22/23 1818

## 2023-03-22 NOTE — ED Notes (Signed)
Patient up to the bathroom.

## 2023-03-22 NOTE — ED Notes (Signed)
Ultrasound in room

## 2023-03-22 NOTE — ED Triage Notes (Addendum)
Pt BIB EMS from home c/o dizziness since this morning. Denies pain.  128/92 76HR 100 % Cbg 142

## 2023-03-23 ENCOUNTER — Inpatient Hospital Stay
Admission: RE | Admit: 2023-03-23 | Discharge: 2023-03-23 | Disposition: A | Discharge status: Home / Self Care | Payer: No Typology Code available for payment source | Source: Ambulatory Visit | Attending: Interventional Radiology | Admitting: Interventional Radiology

## 2023-03-31 ENCOUNTER — Encounter (HOSPITAL_BASED_OUTPATIENT_CLINIC_OR_DEPARTMENT_OTHER): Payer: Self-pay | Admitting: Obstetrics & Gynecology

## 2023-04-04 ENCOUNTER — Other Ambulatory Visit (HOSPITAL_COMMUNITY): Payer: Self-pay | Admitting: Interventional Radiology

## 2023-04-04 ENCOUNTER — Ambulatory Visit
Admission: RE | Admit: 2023-04-04 | Discharge: 2023-04-04 | Disposition: A | Discharge status: Home / Self Care | Payer: No Typology Code available for payment source | Source: Ambulatory Visit | Attending: Interventional Radiology | Admitting: Interventional Radiology

## 2023-04-04 DIAGNOSIS — M25562 Pain in left knee: Secondary | ICD-10-CM

## 2023-04-04 DIAGNOSIS — D250 Submucous leiomyoma of uterus: Secondary | ICD-10-CM

## 2023-04-04 HISTORY — PX: IR RADIOLOGIST EVAL & MGMT: IMG5224
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
## 2023-04-11 ENCOUNTER — Other Ambulatory Visit (HOSPITAL_COMMUNITY): Payer: Self-pay | Admitting: Interventional Radiology

## 2023-04-11 DIAGNOSIS — M25562 Pain in left knee: Secondary | ICD-10-CM

## 2023-04-18 ENCOUNTER — Other Ambulatory Visit: Payer: No Typology Code available for payment source

## 2023-04-27 ENCOUNTER — Other Ambulatory Visit: Payer: No Typology Code available for payment source
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
## 2023-05-02 ENCOUNTER — Ambulatory Visit: Payer: No Typology Code available for payment source | Admitting: Family Medicine

## 2023-05-02 VITALS — BP 128/82 | HR 84 | Ht 61.0 in | Wt 208.0 lb

## 2023-05-02 DIAGNOSIS — M545 Low back pain, unspecified: Secondary | ICD-10-CM

## 2023-05-02 NOTE — Assessment & Plan Note (Signed)
Low back exam does have some loss of lordosis noted.  Some tenderness to palpation very minorly but is sitting very comfortably today with daughter.  Patient has stated that she is making some improvement.  Would like to consider the possibility of formal physical therapy.  Will refer her today.  No change in medications.  Patient is going to follow-up with gastroenterology as well because she is due with patient having some atypical polyps previously.  Follow-up with me again in 2 to 3 months.

## 2023-05-02 NOTE — Patient Instructions (Signed)
PT [facility] good news for dad See you again in 2-3 months

## 2023-05-08 ENCOUNTER — Encounter (HOSPITAL_BASED_OUTPATIENT_CLINIC_OR_DEPARTMENT_OTHER): Payer: Self-pay | Admitting: Obstetrics & Gynecology

## 2023-05-09 ENCOUNTER — Other Ambulatory Visit (HOSPITAL_BASED_OUTPATIENT_CLINIC_OR_DEPARTMENT_OTHER): Payer: Self-pay | Admitting: Obstetrics & Gynecology

## 2023-05-09 ENCOUNTER — Other Ambulatory Visit (HOSPITAL_COMMUNITY): Payer: Self-pay | Admitting: Interventional Radiology

## 2023-05-09 ENCOUNTER — Ambulatory Visit
Admission: RE | Admit: 2023-05-09 | Discharge: 2023-05-09 | Disposition: A | Discharge status: Home / Self Care | Payer: No Typology Code available for payment source | Source: Ambulatory Visit | Attending: Interventional Radiology | Admitting: Interventional Radiology

## 2023-05-09 DIAGNOSIS — M25562 Pain in left knee: Secondary | ICD-10-CM

## 2023-05-09 MED ORDER — NORETHINDRONE ACETATE 5 MG PO TABS: 10.0000 mg | ORAL_TABLET | Freq: Two times a day (BID) | ORAL | 2 refills | Status: DC
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
## 2023-05-12 ENCOUNTER — Other Ambulatory Visit: Payer: Self-pay

## 2023-05-12 ENCOUNTER — Ambulatory Visit: Payer: No Typology Code available for payment source | Attending: Family Medicine

## 2023-05-12 DIAGNOSIS — M545 Low back pain, unspecified: Secondary | ICD-10-CM | POA: Insufficient documentation

## 2023-05-12 DIAGNOSIS — R293 Abnormal posture: Secondary | ICD-10-CM | POA: Diagnosis present

## 2023-05-12 DIAGNOSIS — M6281 Muscle weakness (generalized): Secondary | ICD-10-CM

## 2023-05-12 DIAGNOSIS — M5459 Other low back pain: Secondary | ICD-10-CM | POA: Diagnosis present

## 2023-05-18 ENCOUNTER — Ambulatory Visit: Payer: No Typology Code available for payment source | Admitting: Internal Medicine

## 2023-05-19 ENCOUNTER — Ambulatory Visit (INDEPENDENT_AMBULATORY_CARE_PROVIDER_SITE_OTHER): Payer: No Typology Code available for payment source | Admitting: Internal Medicine

## 2023-05-19 ENCOUNTER — Encounter: Payer: Self-pay | Admitting: Internal Medicine

## 2023-05-19 VITALS — BP 134/80 | HR 90 | Ht 61.0 in | Wt 209.0 lb

## 2023-05-19 DIAGNOSIS — E876 Hypokalemia: Secondary | ICD-10-CM | POA: Diagnosis not present

## 2023-05-19 DIAGNOSIS — E042 Nontoxic multinodular goiter: Secondary | ICD-10-CM | POA: Diagnosis not present

## 2023-05-19 DIAGNOSIS — E213 Hyperparathyroidism, unspecified: Secondary | ICD-10-CM | POA: Diagnosis not present

## 2023-05-19 LAB — BASIC METABOLIC PANEL
BUN: 15 mg/dL (ref 6–23)
CO2: 28 mEq/L (ref 19–32)
Calcium: 11.1 mg/dL — ABNORMAL HIGH (ref 8.4–10.5)
Chloride: 103 mEq/L (ref 96–112)
Creatinine, Ser: 1.02 mg/dL (ref 0.40–1.20)
GFR: 64.71 mL/min (ref >60.00)
Glucose, Bld: 102 mg/dL — ABNORMAL HIGH (ref 70–99)
Potassium: 4.5 mEq/L (ref 3.5–5.1)
Sodium: 138 mEq/L (ref 135–145)

## 2023-05-19 LAB — VITAMIN D 25 HYDROXY (VIT D DEFICIENCY, FRACTURES): VITD: 23.93 ng/mL — ABNORMAL LOW (ref 30.00–100.00)

## 2023-05-19 LAB — T4, FREE: Free T4: 0.8 ng/dL (ref 0.60–1.60)

## 2023-05-19 LAB — TSH: TSH: 1.39 u[IU]/mL (ref 0.35–5.50)

## 2023-05-19 LAB — ALBUMIN: Albumin: 4 g/dL (ref 3.5–5.2)
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
## 2023-05-19 NOTE — Patient Instructions (Addendum)
-   Stay hydrated  - Avoid over the counter calcium tablets  - Consume 2-3 servings of calcium in your diet ( low fat dairy, Bittel leafy vegetables)  24-Hour Urine Collection  You will be collecting your urine for a 24-hour period of time. Your timer starts with your first urine of the morning (For example - If you first pee at 9AM, your timer will start at 9AM) Throw away your first urine of the morning Collect your urine every time you pee for the next 24 hours STOP your urine collection 24 hours after you started the collection (For example - You would stop at 9AM the day after you started)

## 2023-05-23 ENCOUNTER — Other Ambulatory Visit (INDEPENDENT_AMBULATORY_CARE_PROVIDER_SITE_OTHER): Payer: No Typology Code available for payment source

## 2023-05-23 ENCOUNTER — Encounter: Payer: Self-pay | Admitting: Interventional Radiology

## 2023-05-23 DIAGNOSIS — E876 Hypokalemia: Secondary | ICD-10-CM | POA: Diagnosis not present

## 2023-05-25 ENCOUNTER — Ambulatory Visit: Payer: No Typology Code available for payment source

## 2023-06-01 ENCOUNTER — Ambulatory Visit: Payer: No Typology Code available for payment source

## 2023-06-01 LAB — CALCIUM, URINE, 24 HOUR: Calcium, 24H Urine: 144 mg/24 h
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
## 2023-06-08 ENCOUNTER — Ambulatory Visit: Payer: No Typology Code available for payment source | Attending: Family Medicine

## 2023-06-08 DIAGNOSIS — M6281 Muscle weakness (generalized): Secondary | ICD-10-CM | POA: Diagnosis present

## 2023-06-08 DIAGNOSIS — M5459 Other low back pain: Secondary | ICD-10-CM | POA: Diagnosis present

## 2023-06-08 DIAGNOSIS — R293 Abnormal posture: Secondary | ICD-10-CM | POA: Insufficient documentation

## 2023-06-15 ENCOUNTER — Telehealth: Payer: Self-pay

## 2023-06-15 ENCOUNTER — Ambulatory Visit: Payer: No Typology Code available for payment source | Admitting: Physical Therapy

## 2023-06-15 NOTE — Telephone Encounter (Signed)
Patient lvm to reschedule apt times returned call, left vm to help her reschedule.

## 2023-06-22 ENCOUNTER — Ambulatory Visit: Payer: No Typology Code available for payment source | Admitting: Physical Therapy

## 2023-06-29 ENCOUNTER — Ambulatory Visit: Payer: No Typology Code available for payment source
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
## 2023-07-04 ENCOUNTER — Encounter: Payer: Self-pay | Admitting: Family Medicine

## 2023-07-04 ENCOUNTER — Ambulatory Visit (INDEPENDENT_AMBULATORY_CARE_PROVIDER_SITE_OTHER): Payer: No Typology Code available for payment source

## 2023-07-04 ENCOUNTER — Ambulatory Visit (INDEPENDENT_AMBULATORY_CARE_PROVIDER_SITE_OTHER): Payer: No Typology Code available for payment source | Admitting: Family Medicine

## 2023-07-04 VITALS — BP 124/86 | HR 88 | Ht 61.0 in | Wt 209.0 lb

## 2023-07-04 DIAGNOSIS — M545 Low back pain, unspecified: Secondary | ICD-10-CM

## 2023-07-04 DIAGNOSIS — M2550 Pain in unspecified joint: Secondary | ICD-10-CM

## 2023-07-04 DIAGNOSIS — M25512 Pain in left shoulder: Secondary | ICD-10-CM

## 2023-07-04 LAB — CBC WITH DIFFERENTIAL/PLATELET
Basophils Absolute: 0.1 10*3/uL (ref 0.0–0.1)
Basophils Relative: 0.8 % (ref 0.0–3.0)
Eosinophils Absolute: 0.1 10*3/uL (ref 0.0–0.7)
Eosinophils Relative: 1.2 % (ref 0.0–5.0)
HCT: 40.2 % (ref 36.0–46.0)
Hemoglobin: 13.1 g/dL (ref 12.0–15.0)
Lymphocytes Relative: 25.2 % (ref 12.0–46.0)
Lymphs Abs: 1.7 10*3/uL (ref 0.7–4.0)
MCHC: 32.5 g/dL (ref 30.0–36.0)
MCV: 89.1 fl (ref 78.0–100.0)
Monocytes Absolute: 0.6 10*3/uL (ref 0.1–1.0)
Monocytes Relative: 8.9 % (ref 3.0–12.0)
Neutro Abs: 4.3 10*3/uL (ref 1.4–7.7)
Neutrophils Relative %: 63.9 % (ref 43.0–77.0)
Platelets: 300 10*3/uL (ref 150.0–400.0)
RBC: 4.51 Mil/uL (ref 3.87–5.11)
RDW: 14.2 % (ref 11.5–15.5)
WBC: 6.7 10*3/uL (ref 4.0–10.5)

## 2023-07-04 LAB — COMPREHENSIVE METABOLIC PANEL
ALT: 17 U/L (ref 0–35)
AST: 14 U/L (ref 0–37)
Albumin: 3.9 g/dL (ref 3.5–5.2)
Alkaline Phosphatase: 54 U/L (ref 39–117)
BUN: 15 mg/dL (ref 6–23)
CO2: 26 meq/L (ref 19–32)
Calcium: 10.8 mg/dL — ABNORMAL HIGH (ref 8.4–10.5)
Chloride: 106 meq/L (ref 96–112)
Creatinine, Ser: 1 mg/dL (ref 0.40–1.20)
GFR: 66.21 mL/min (ref >60.00)
Glucose, Bld: 92 mg/dL (ref 70–99)
Potassium: 3.9 meq/L (ref 3.5–5.1)
Sodium: 138 meq/L (ref 135–145)
Total Bilirubin: 0.7 mg/dL (ref 0.2–1.2)
Total Protein: 7.5 g/dL (ref 6.0–8.3)

## 2023-07-04 LAB — SEDIMENTATION RATE: Sed Rate: 50 mm/h — ABNORMAL HIGH (ref 0–20)

## 2023-07-04 LAB — VITAMIN D 25 HYDROXY (VIT D DEFICIENCY, FRACTURES): VITD: 26.56 ng/mL — ABNORMAL LOW (ref 30.00–100.00)

## 2023-07-04 NOTE — Patient Instructions (Signed)
Labs today Xray today Shoulder exercises PT will call you See me in 2 months

## 2023-07-06 ENCOUNTER — Telehealth: Payer: Self-pay

## 2023-07-06 NOTE — Telephone Encounter (Signed)
Patient states that she had labs with Dr. Katrinka Blazing and was advised to notify you that the calcium had increase and the Parathyroid hormone had increased as well.  Patient would like to know if there is anything that needs to be adjust from you .

## 2023-07-07 ENCOUNTER — Telehealth: Payer: Self-pay

## 2023-07-07 NOTE — Telephone Encounter (Signed)
...  Pre-operative Risk Assessment    Patient Name: Audrey [name]  DOB: 1976-10-14 MRN: [id number]      Request for Surgical Clearance    Procedure:   COLORECTAL CANCER SCREENING  Date of Surgery:  Clearance TBD                                 Surgeon:  Delfino Lovett Surgeon's Group or Practice Name:  [facility] Phone number: [phone] Fax number:   [phone]   Type of Clearance Requested:   - Medical    Type of Anesthesia:  Not Indicated   Additional requests/questions:   LAST O/V 06/16/16, NO FOLLOW O/V  Signed, Renee Ramus   07/07/2023, 2:56 PM

## 2023-07-10 ENCOUNTER — Encounter: Payer: Self-pay | Admitting: Family Medicine

## 2023-07-10 NOTE — Telephone Encounter (Signed)
Pt hasn't bee seen since 2017, therefore will be considered a new pt.  Will route back to the surgeon's office to make sure they send over a new pt referral so pt can get scheduled.  Also, will ask them to clarify what pt is having done.

## 2023-07-10 NOTE — Telephone Encounter (Signed)
Name: Audrey [name]  DOB: [birth year]  MRN: [id number]  Primary Cardiologist: None  Chart reviewed as part of pre-operative protocol coverage. Because of Gentry O Mcfaul's past medical history and time since last visit, she will require a follow-up in-office visit in order to better assess preoperative cardiovascular risk.   Patient was last seen in 2017 by Dr. Katrinka Blazing so needs OV. Can you also find out from patient what exactly she will be having done? Thank you!  Pre-op covering staff: - Please schedule appointment and call patient to inform them. If patient already had an upcoming appointment within acceptable timeframe, please add "pre-op clearance" to the appointment notes so provider is aware. - Please contact requesting surgeon's office via preferred method (i.e, phone, fax) to inform them of need for appointment prior to surgery.    Laurann Montana, PA-C  07/10/2023, 1:09 PM

## 2023-07-12 NOTE — Telephone Encounter (Signed)
See my message 07/10/23 - needs new patient appointment as she has not been seen since 2017. Clearance now updated to reflect colonoscopy. Thank you!

## 2023-07-12 NOTE — Telephone Encounter (Signed)
Pt is having a Colonoscopy with Propofol.  We have received a new pt referral, not just waiting on an appointment.

## 2023-07-13 NOTE — Telephone Encounter (Signed)
I will update all parties involved pt has new pt appt with Dr. Odis Hollingshead 08/07/23 for pre op clearance.

## 2023-07-17 NOTE — Telephone Encounter (Signed)
Noted.  Tessa Lerner, DO, [facility]

## 2023-07-18 ENCOUNTER — Ambulatory Visit (HOSPITAL_BASED_OUTPATIENT_CLINIC_OR_DEPARTMENT_OTHER): Payer: No Typology Code available for payment source | Admitting: Obstetrics & Gynecology

## 2023-07-18 ENCOUNTER — Encounter (HOSPITAL_BASED_OUTPATIENT_CLINIC_OR_DEPARTMENT_OTHER): Payer: Self-pay | Admitting: Obstetrics & Gynecology

## 2023-07-18 VITALS — BP 133/86 | HR 79 | Ht 61.5 in | Wt 213.8 lb

## 2023-07-18 DIAGNOSIS — Z91013 Allergy to seafood: Secondary | ICD-10-CM

## 2023-07-18 DIAGNOSIS — T7840XD Allergy, unspecified, subsequent encounter: Secondary | ICD-10-CM

## 2023-07-18 DIAGNOSIS — E213 Hyperparathyroidism, unspecified: Secondary | ICD-10-CM | POA: Diagnosis not present

## 2023-07-18 DIAGNOSIS — Z8742 Personal history of other diseases of the female genital tract: Secondary | ICD-10-CM | POA: Diagnosis not present

## 2023-07-18 DIAGNOSIS — D251 Intramural leiomyoma of uterus: Secondary | ICD-10-CM

## 2023-07-18 DIAGNOSIS — G8929 Other chronic pain: Secondary | ICD-10-CM

## 2023-07-18 DIAGNOSIS — D508 Other iron deficiency anemias: Secondary | ICD-10-CM | POA: Diagnosis not present

## 2023-07-18 DIAGNOSIS — R232 Flushing: Secondary | ICD-10-CM

## 2023-07-18 DIAGNOSIS — D252 Subserosal leiomyoma of uterus: Secondary | ICD-10-CM

## 2023-07-18 DIAGNOSIS — M545 Low back pain, unspecified: Secondary | ICD-10-CM

## 2023-07-18 MED ORDER — EPINEPHRINE 0.3 MG/0.3ML IJ SOAJ: 0.3000 mg | INTRAMUSCULAR | 1 refills | Status: AC | PRN
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
## 2023-07-19 ENCOUNTER — Encounter (HOSPITAL_BASED_OUTPATIENT_CLINIC_OR_DEPARTMENT_OTHER): Payer: Self-pay | Admitting: *Deleted

## 2023-07-19 LAB — FOLLICLE STIMULATING HORMONE: FSH: 16 m[IU]/mL

## 2023-07-19 LAB — ESTRADIOL: Estradiol: 8.6 pg/mL

## 2023-07-23 ENCOUNTER — Encounter (HOSPITAL_BASED_OUTPATIENT_CLINIC_OR_DEPARTMENT_OTHER): Payer: Self-pay | Admitting: Obstetrics & Gynecology

## 2023-07-24 ENCOUNTER — Other Ambulatory Visit (HOSPITAL_BASED_OUTPATIENT_CLINIC_OR_DEPARTMENT_OTHER): Payer: Self-pay | Admitting: Obstetrics & Gynecology

## 2023-07-24 DIAGNOSIS — E213 Hyperparathyroidism, unspecified: Secondary | ICD-10-CM

## 2023-08-07 ENCOUNTER — Ambulatory Visit: Payer: No Typology Code available for payment source | Attending: Cardiology | Admitting: Cardiology

## 2023-08-07 ENCOUNTER — Encounter: Payer: Self-pay | Admitting: Cardiology

## 2023-08-07 VITALS — BP 134/88 | HR 73 | Resp 16 | Ht 61.0 in | Wt 218.6 lb

## 2023-08-07 DIAGNOSIS — R072 Precordial pain: Secondary | ICD-10-CM | POA: Diagnosis not present

## 2023-08-07 DIAGNOSIS — I10 Essential (primary) hypertension: Secondary | ICD-10-CM | POA: Diagnosis not present

## 2023-08-07 DIAGNOSIS — Z6841 Body Mass Index (BMI) 40.0 and over, adult: Secondary | ICD-10-CM

## 2023-08-07 DIAGNOSIS — Z01810 Encounter for preprocedural cardiovascular examination: Secondary | ICD-10-CM | POA: Diagnosis not present

## 2023-08-07 DIAGNOSIS — E66813 Obesity, class 3: Secondary | ICD-10-CM

## 2023-08-07 MED ORDER — PREDNISONE 50 MG PO TABS: ORAL_TABLET | ORAL | 0 refills | Status: DC

## 2023-08-07 MED ORDER — METOPROLOL TARTRATE 25 MG PO TABS: 25.0000 mg | ORAL_TABLET | Freq: Two times a day (BID) | ORAL | 0 refills | Status: DC
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
## 2023-08-07 NOTE — Patient Instructions (Addendum)
Medication Instructions:  Your physician has recommended you make the following change in your medication:   START Metoprolol Tartrate (Lopressor) 25 mg twice daily ONE WEEK BEFORE CARDIAC CT  START Prednisone - follow directions as listed   *If you need a refill on your cardiac medications before your next appointment, please call your pharmacy*  Lab Work: TODAY BMP  If you have labs (blood work) drawn today and your tests are completely normal, you will receive your results only by: MyChart Message (if you have MyChart) OR A paper copy in the mail If you have any lab test that is abnormal or we need to change your treatment, we will call you to review the results.  Testing/Procedures: Your physician has requested that you have an echocardiogram. Echocardiography is a painless test that uses sound waves to create images of your heart. It provides your doctor with information about the size and shape of your heart and how well your heart's chambers and valves are working. This procedure takes approximately one hour. There are no restrictions for this procedure. Please do NOT wear cologne, perfume, aftershave, or lotions (deodorant is allowed). Please arrive 15 minutes prior to your appointment time.  Your physician has requested that you have cardiac CT. Cardiac computed tomography (CT) is a painless test that uses an x-ray machine to take clear, detailed pictures of your heart. For further information please visit [email or web address] Please follow instruction sheet as given.    Follow-Up: At [facility], you and your health needs are our priority.  As part of our continuing mission to provide you with exceptional heart care, we have created designated Provider Care Teams.  These Care Teams include your primary Cardiologist (physician) and Advanced Practice Providers (APPs -  Physician Assistants and Nurse Practitioners) who all work together to provide you with the care you need, when  you need it.  Your next appointment:   1 year(s)  The format for your next appointment:   In Person  Provider:   Tessa Lerner, DO {  Other Instructions   Your cardiac CT will be scheduled at one of the below locations:   [facility] [address] [place], [place] [address] [phone]  If scheduled at [facility], please arrive at the [facility] and Children's Entrance (Entrance C2) of [facility] 30 minutes prior to test start time. You can use the FREE valet parking offered at entrance C (encouraged to control the heart rate for the test)  Proceed to the [facility] Radiology Department (first floor) to check-in and test prep.  All radiology patients and guests should use entrance C2 at [facility], accessed from [facility], even though the hospital's physical address listed is [address].    Please follow these instructions carefully (unless otherwise directed):  An IV will be required for this test and Nitroglycerin will be given.  Hold all erectile dysfunction medications at least 3 days (72 hrs) prior to test. (Ie viagra, cialis, sildenafil, tadalafil, etc)   On the Night Before the Test: Be sure to Drink plenty of water. Do not consume any caffeinated/decaffeinated beverages or chocolate 12 hours prior to your test. Do not take any antihistamines 12 hours prior to your test. If the patient has contrast allergy: Patient will need a prescription for Prednisone and very clear instructions (as follows): Prednisone 50 mg - take 13 hours prior to test Take another Prednisone 50 mg 7 hours prior to test Take  another Prednisone 50 mg 1 hour prior to test Patient must complete all three doses of above prophylactic medications.  On the Day of the Test: Drink plenty of water until 1 hour prior to the test. Do not eat any food 1 hour prior to test. You may take your regular medications prior to the test.  Take  metoprolol (Lopressor) two hours prior to test. FEMALES- please wear underwire-free bra if available, avoid dresses & tight clothing      After the Test: Drink plenty of water. After receiving IV contrast, you may experience a mild flushed feeling. This is normal. On occasion, you may experience a mild rash up to 24 hours after the test. This is not dangerous. If this occurs, you can take Benadryl 25 mg and increase your fluid intake. If you experience trouble breathing, this can be serious. If it is severe call 911 IMMEDIATELY. If it is mild, please call our office.  We will call to schedule your test 2-4 weeks out understanding that some insurance companies will need an authorization prior to the service being performed.   For more information and frequently asked questions, please visit our website : [email or web address]  For non-scheduling related questions, please contact the cardiac imaging nurse navigator should you have any questions/concerns: Cardiac Imaging Nurse Navigators Direct Office Dial: [phone]   For scheduling needs, including cancellations and rescheduling, please call [place], [phone].

## 2023-08-08 LAB — BASIC METABOLIC PANEL
BUN/Creatinine Ratio: 16 (ref 9–23)
BUN: 14 mg/dL (ref 6–24)
CO2: 24 mmol/L (ref 20–29)
Calcium: 11.1 mg/dL — ABNORMAL HIGH (ref 8.7–10.2)
Chloride: 103 mmol/L (ref 96–106)
Creatinine, Ser: 0.9 mg/dL (ref 0.57–1.00)
Glucose: 92 mg/dL (ref 70–99)
Potassium: 4.1 mmol/L (ref 3.5–5.2)
Sodium: 141 mmol/L (ref 134–144)
eGFR: 78 mL/min/{1.73_m2} (ref >59)

## 2023-08-24 ENCOUNTER — Other Ambulatory Visit (HOSPITAL_COMMUNITY): Payer: Self-pay | Admitting: Surgery

## 2023-08-24 DIAGNOSIS — E210 Primary hyperparathyroidism: Secondary | ICD-10-CM

## 2023-08-31 ENCOUNTER — Ambulatory Visit (HOSPITAL_COMMUNITY): Payer: No Typology Code available for payment source | Attending: Cardiology

## 2023-08-31 DIAGNOSIS — Z01810 Encounter for preprocedural cardiovascular examination: Secondary | ICD-10-CM | POA: Diagnosis present

## 2023-08-31 DIAGNOSIS — R072 Precordial pain: Secondary | ICD-10-CM | POA: Diagnosis present

## 2023-08-31 LAB — ECHOCARDIOGRAM COMPLETE
Area-P 1/2: 4.54 cm2
S' Lateral: 2.6 cm

## 2023-08-31 NOTE — Telephone Encounter (Signed)
Called pt in response to [employer]. Pt reports that she has tried reaching out to IR to schedule follow up as Dr. Hyacinth Meeker recommended. Pt would like to proceed with follow up MRI. She has not received a call from them for scheduling. Advised pt to try calling them again and if she is unsuccessful, to let us know.  Pt reports having a heavier cycle this month as well. She took norethindrone and the bleeding has slowed down some. Advised pt to continue taking norethindrone as prescribed and to let us know if bleeding does not continue to improve.

## 2023-09-01 ENCOUNTER — Encounter (HOSPITAL_COMMUNITY)
Admission: RE | Admit: 2023-09-01 | Discharge: 2023-09-01 | Disposition: A | Discharge status: Home / Self Care | Payer: No Typology Code available for payment source | Source: Ambulatory Visit | Attending: Surgery | Admitting: Surgery

## 2023-09-01 ENCOUNTER — Telehealth (HOSPITAL_COMMUNITY): Payer: Self-pay | Admitting: Emergency Medicine

## 2023-09-01 DIAGNOSIS — E210 Primary hyperparathyroidism: Secondary | ICD-10-CM | POA: Insufficient documentation

## 2023-09-01 MED ORDER — TECHNETIUM TC 99M SESTAMIBI - CARDIOLITE
25.0000 | Freq: Once | INTRAVENOUS | Status: AC | PRN
Start: 2023-09-01 — End: 2023-09-01
  Administered 2023-09-01: 25 via INTRAVENOUS

## 2023-09-01 NOTE — Telephone Encounter (Signed)
Pt calling regarding her shellfish allergy and how to take medications leading up to CCTA on 12/2. Pt reports she was told she should not take benadryl. She has received medication rx for prednisone by Dr. Odis Hollingshead.   I called [facility] Radiology to confirm an alternative strategy for antihistamine for the 13 hr prep contrast allergy protocol. Both Dr. Lowella Dandy and Ralene Muskrat, PA approved the contrasted scan and pre-meds of prednsione WITHOUT use of an antihistamine.   Pt called and informed of same. Mychart message sent with detailed instructions for upcoming appt.  Rockwell Alexandria RN Navigator Cardiac Imaging [facility] Heart [facility] Vascular Services [phone] Office  [phone] Cell
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
## 2023-09-05 ENCOUNTER — Ambulatory Visit (INDEPENDENT_AMBULATORY_CARE_PROVIDER_SITE_OTHER): Payer: No Typology Code available for payment source | Admitting: Family Medicine

## 2023-09-05 ENCOUNTER — Encounter: Payer: Self-pay | Admitting: Family Medicine

## 2023-09-05 VITALS — BP 142/90 | HR 89 | Ht 61.0 in | Wt 215.0 lb

## 2023-09-05 DIAGNOSIS — I739 Peripheral vascular disease, unspecified: Secondary | ICD-10-CM | POA: Diagnosis not present

## 2023-09-05 DIAGNOSIS — M62838 Other muscle spasm: Secondary | ICD-10-CM | POA: Diagnosis not present

## 2023-09-05 NOTE — Patient Instructions (Addendum)
Referral vascular  Let's see what that scan shows See you again in 2 months

## 2023-09-05 NOTE — Assessment & Plan Note (Signed)
Has had difficulty with this, ABIs were relatively unremarkable.  History of hypercalcemia may increase the risk and strong family history of PAD and PVD.  Will send patient to vascular surgery to discuss further treatment options.

## 2023-09-05 NOTE — Assessment & Plan Note (Signed)
Continues to have difficulty with the hypercalcemia.  Has recently transitioned off of the hydrochlorothiazide to see if that would be it but still has elevated in the calcium levels at the moment.  Patient suspect potential tumor and is still awaiting imaging read.  Patient is following up with general surgery with this.  Hopefully will get more evidence of what is contributing.  Patient has made some progress with all her other aches and pains which is helpful.  With patient having lower extremity pain will consider the possibility of vascular referral with patient still having difficulty with the pain in the legs.

## 2023-09-08 ENCOUNTER — Other Ambulatory Visit: Payer: Self-pay | Admitting: Surgery

## 2023-09-08 DIAGNOSIS — E213 Hyperparathyroidism, unspecified: Secondary | ICD-10-CM

## 2023-09-08 NOTE — Progress Notes (Signed)
Sestamibi scan is negative for adenoma.  This occurs about 20% of the time.  USN did not show a parathyroid adenoma either.  Alisha - please contact patient and order a 4D-CT scan with parathyroid protocol to look for a parathyroid adenoma.  I will contact patient when the results are available.  Darnell Level, MD [facility] practice Office: [phone]

## 2023-09-11 ENCOUNTER — Encounter: Payer: Self-pay | Admitting: Family Medicine

## 2023-09-11 ENCOUNTER — Other Ambulatory Visit: Payer: Self-pay | Admitting: Interventional Radiology

## 2023-09-11 ENCOUNTER — Encounter: Payer: Self-pay | Admitting: Cardiology

## 2023-09-11 ENCOUNTER — Encounter (HOSPITAL_BASED_OUTPATIENT_CLINIC_OR_DEPARTMENT_OTHER): Payer: Self-pay | Admitting: Obstetrics & Gynecology

## 2023-09-11 DIAGNOSIS — D259 Leiomyoma of uterus, unspecified: Secondary | ICD-10-CM

## 2023-09-11 NOTE — Progress Notes (Unsigned)
[facility] [address] [place] [address] Phone: [phone]   Assessment and Plan:     There are no diagnoses linked to this encounter.  ***   Pertinent previous records reviewed include ***    Follow Up: ***     Subjective:   I, Audrey [name], am serving as a Neurosurgeon for Doctor [employer]  Chief Complaint: leg pain   HPI:  05/02/2023 Low back exam does have some loss of lordosis noted.  Some tenderness to palpation very minorly but is sitting very comfortably today with daughter.  Patient has stated that she is making some improvement.  Would like to consider the possibility of formal physical therapy.  Will refer her today.  No change in medications.  Patient is going to follow-up with gastroenterology as well because she is due with patient having some atypical polyps previously.  Follow-up with me again in 2 to 3 months.      Update 07/04/2023 Audrey [name] is a 47 y.o. female coming in with complaint of low back pain.  Was to start formal physical therapy.  Patient has legitimately gone 1 time. Has not been able to go to PT sessions since school started.    Patient states that she is having L shoulder pain in anterior aspect. Painful to do flexion and adduction.    Also states that her feet hurt when she is walking.   09/12/2023 Patient states   Relevant Historical Information: ***  Additional pertinent review of systems negative.   Current Outpatient Medications:    acetaminophen (TYLENOL) 500 MG tablet, Take 1,000 mg by mouth every 6 (six) hours as needed for mild pain., Disp: , Rfl:    ADVAIR HFA 115-21 MCG/ACT inhaler, Inhale 2 puffs into the lungs 2 (two) times daily., Disp: , Rfl:    albuterol (PROVENTIL HFA;VENTOLIN HFA) 108 (90 BASE) MCG/ACT inhaler, Inhale 2 puffs into the lungs every 6 (six) hours as needed for wheezing or shortness of breath. For shortness of breath, Disp: , Rfl:     EPINEPHrine 0.3 mg/0.3 mL IJ SOAJ injection, Inject 0.3 mg into the muscle as needed for anaphylaxis., Disp: 1 each, Rfl: 1   hydrocortisone 2.5 % cream, Apply topically 2 (two) times daily., Disp: , Rfl:    losartan (COZAAR) 25 MG tablet, Take 50 mg by mouth daily., Disp: , Rfl:    meclizine (ANTIVERT) 25 MG tablet, Take 1 tablet (25 mg total) by mouth 3 (three) times daily as needed for dizziness., Disp: 30 tablet, Rfl: 0   metoprolol tartrate (LOPRESSOR) 25 MG tablet, Take 1 tablet (25 mg total) by mouth 2 (two) times daily. DO NOT take is systolic blood pressure (top number) is less than 100 and/or if heart rate is less than 55, Disp: 28 tablet, Rfl: 0   mupirocin ointment (BACTROBAN) 2 %, 1 Application 2 (two) times daily., Disp: , Rfl:    norethindrone (AYGESTIN) 5 MG tablet, Take 2 tablets (10 mg total) by mouth 2 (two) times daily. When bleeding improves, ok to decrease to 5mg  twice daily. (Patient taking differently: Take 10 mg by mouth 2 (two) times daily. When bleeding improves, ok to decrease to 5mg  twice daily.), Disp: 60 tablet, Rfl: 2   OVER THE COUNTER MEDICATION, Take 1 capsule by mouth daily. Vitamin D, Disp: , Rfl:    predniSONE (DELTASONE) 50 MG tablet, Take one tablet 13 hours, 7 hours, and 1 hour prior to scan.,  Disp: 3 tablet, Rfl: 0   Objective:     There were no vitals filed for this visit.    There is no height or weight on file to calculate BMI.    Physical Exam:    ***   Electronically signed by:  [facility] 3:52 PM 09/11/23

## 2023-09-12 ENCOUNTER — Other Ambulatory Visit: Payer: Self-pay | Admitting: Interventional Radiology

## 2023-09-12 ENCOUNTER — Other Ambulatory Visit (HOSPITAL_COMMUNITY): Payer: Self-pay | Admitting: Surgery

## 2023-09-12 ENCOUNTER — Ambulatory Visit (INDEPENDENT_AMBULATORY_CARE_PROVIDER_SITE_OTHER): Payer: No Typology Code available for payment source | Admitting: Sports Medicine

## 2023-09-12 VITALS — BP 112/72 | HR 78 | Ht 61.0 in | Wt 215.0 lb

## 2023-09-12 DIAGNOSIS — E213 Hyperparathyroidism, unspecified: Secondary | ICD-10-CM

## 2023-09-12 DIAGNOSIS — G5701 Lesion of sciatic nerve, right lower limb: Secondary | ICD-10-CM

## 2023-09-12 DIAGNOSIS — D259 Leiomyoma of uterus, unspecified: Secondary | ICD-10-CM

## 2023-09-12 DIAGNOSIS — M5431 Sciatica, right side: Secondary | ICD-10-CM

## 2023-09-12 MED ORDER — MELOXICAM 15 MG PO TABS: 15.0000 mg | ORAL_TABLET | Freq: Every day | ORAL | 0 refills | Status: DC

## 2023-09-12 NOTE — Patient Instructions (Signed)
-   Start meloxicam 15 mg daily x2 weeks.  If still having pain after 2 weeks, complete 3rd-week of NSAID. May use remaining NSAID as needed once daily for pain control.  Do not to use additional over-the-counter NSAIDs (ibuprofen, naproxen, Advil, Aleve) while taking prescription NSAIDs.  May use Tylenol [phone] mg 2 to 3 times a day for breakthrough pain. Piriformis HEP Recommend stationary bike, elliptical, or water aerobics See me or Dr. Katrinka Blazing in 4-6 weeks

## 2023-09-15 ENCOUNTER — Telehealth (HOSPITAL_COMMUNITY): Payer: Self-pay | Admitting: Emergency Medicine

## 2023-09-15 NOTE — Telephone Encounter (Signed)
Reaching out to patient to offer assistance regarding upcoming cardiac imaging study; pt verbalizes understanding of appt date/time, parking situation and where to check in, pre-test NPO status and medications ordered, and verified current allergies; name and call back number provided for further questions should they arise Cayne Yom RN Navigator Cardiac Imaging [facility] [phone] office [phone] cell 

## 2023-09-18 ENCOUNTER — Ambulatory Visit (HOSPITAL_COMMUNITY)
Admission: RE | Admit: 2023-09-18 | Discharge: 2023-09-18 | Disposition: A | Discharge status: Home / Self Care | Payer: No Typology Code available for payment source | Source: Ambulatory Visit | Attending: Cardiology | Admitting: Cardiology

## 2023-09-18 DIAGNOSIS — R072 Precordial pain: Secondary | ICD-10-CM | POA: Diagnosis not present

## 2023-09-18 MED ORDER — NITROGLYCERIN 0.4 MG SL SUBL
0.8000 mg | SUBLINGUAL_TABLET | Freq: Once | SUBLINGUAL | Status: AC
  Administered 2023-09-18: 0.8 mg via SUBLINGUAL

## 2023-09-18 MED ORDER — NITROGLYCERIN 0.4 MG SL SUBL
SUBLINGUAL_TABLET | SUBLINGUAL | Status: AC
  Filled 2023-09-18: qty 2

## 2023-09-18 MED ORDER — DILTIAZEM HCL 25 MG/5ML IV SOLN: 10.0000 mg | INTRAVENOUS | Status: DC | PRN

## 2023-09-18 MED ORDER — IOHEXOL 350 MG/ML SOLN
100.0000 mL | Freq: Once | INTRAVENOUS | Status: AC | PRN
  Administered 2023-09-18: 100 mL via INTRAVENOUS

## 2023-09-18 MED ORDER — DILTIAZEM HCL 25 MG/5ML IV SOLN
INTRAVENOUS | Status: AC
  Filled 2023-09-18: qty 5

## 2023-09-18 MED ORDER — METOPROLOL TARTRATE 5 MG/5ML IV SOLN
INTRAVENOUS | Status: AC
  Filled 2023-09-18: qty 10

## 2023-09-18 MED ORDER — METOPROLOL TARTRATE 5 MG/5ML IV SOLN
10.0000 mg | Freq: Once | INTRAVENOUS | Status: AC | PRN
  Administered 2023-09-18: 10 mg via INTRAVENOUS

## 2023-09-24 ENCOUNTER — Encounter: Payer: Self-pay | Admitting: Cardiology

## 2023-09-27 ENCOUNTER — Ambulatory Visit (HOSPITAL_COMMUNITY)
Admission: RE | Admit: 2023-09-27 | Discharge: 2023-09-27 | Disposition: A | Discharge status: Home / Self Care | Payer: No Typology Code available for payment source | Source: Ambulatory Visit | Attending: Surgery | Admitting: Surgery

## 2023-09-27 DIAGNOSIS — E213 Hyperparathyroidism, unspecified: Secondary | ICD-10-CM | POA: Insufficient documentation

## 2023-09-27 MED ORDER — IOHEXOL 350 MG/ML SOLN
75.0000 mL | Freq: Once | INTRAVENOUS | Status: AC | PRN
  Administered 2023-09-27: 75 mL via INTRAVENOUS

## 2023-10-03 ENCOUNTER — Other Ambulatory Visit: Payer: Self-pay | Admitting: *Deleted

## 2023-10-03 DIAGNOSIS — M79606 Pain in leg, unspecified: Secondary | ICD-10-CM

## 2023-10-05 ENCOUNTER — Ambulatory Visit
Admission: RE | Admit: 2023-10-05 | Discharge: 2023-10-05 | Disposition: A | Discharge status: Home / Self Care | Payer: No Typology Code available for payment source | Source: Ambulatory Visit | Attending: Interventional Radiology | Admitting: Interventional Radiology

## 2023-10-05 DIAGNOSIS — D259 Leiomyoma of uterus, unspecified: Secondary | ICD-10-CM

## 2023-10-05 MED ORDER — GADOPICLENOL 0.5 MMOL/ML IV SOLN
10.0000 mL | Freq: Once | INTRAVENOUS | Status: AC | PRN
  Administered 2023-10-05: 10 mL via INTRAVENOUS

## 2023-10-05 NOTE — Progress Notes (Signed)
CT scan is negative for evidence of a parathyroid adenoma.  Tresa Endo - please schedule an office visit for this patient to review all of her studies and discuss possible surgical management.  Darnell Level, MD [facility] [place] [facility] practice Office: [phone]
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
## 2023-10-19 ENCOUNTER — Ambulatory Visit (INDEPENDENT_AMBULATORY_CARE_PROVIDER_SITE_OTHER): Payer: No Typology Code available for payment source | Admitting: Vascular Surgery

## 2023-10-19 ENCOUNTER — Encounter: Payer: Self-pay | Admitting: Vascular Surgery

## 2023-10-19 ENCOUNTER — Ambulatory Visit (HOSPITAL_COMMUNITY)
Admission: RE | Admit: 2023-10-19 | Discharge: 2023-10-19 | Disposition: A | Discharge status: Home / Self Care | Payer: No Typology Code available for payment source | Source: Ambulatory Visit | Attending: Vascular Surgery | Admitting: Vascular Surgery

## 2023-10-19 VITALS — BP 135/88 | HR 80 | Temp 98.1°F | Resp 20 | Ht 61.0 in | Wt 215.8 lb

## 2023-10-19 DIAGNOSIS — M79606 Pain in leg, unspecified: Secondary | ICD-10-CM

## 2023-10-19 LAB — VAS US ABI WITH/WO TBI
Left ABI: 1.08
Right ABI: 1.1
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
## 2023-10-24 ENCOUNTER — Ambulatory Visit: Payer: No Typology Code available for payment source | Admitting: Family Medicine

## 2023-11-01 ENCOUNTER — Ambulatory Visit: Payer: Self-pay | Admitting: Surgery

## 2023-11-01 ENCOUNTER — Telehealth: Payer: Self-pay

## 2023-11-01 NOTE — Telephone Encounter (Signed)
 Tried calling patient no answer unable to leave vm will try again later

## 2023-11-01 NOTE — Telephone Encounter (Signed)
   Name: Audrey [name]  DOB: 03/30/1976  MRN: [id number]  Primary Cardiologist: Olinda Bertrand, DO  Last seen on 08/07/2023.  Preoperative team, please contact this patient and set up a phone call appointment for further preoperative risk assessment. Please obtain consent and complete medication review. Thank you for your help.  I confirm that guidance regarding antiplatelet and oral anticoagulation therapy has been completed and, if necessary, noted below.  I reviewed med list the patient is not on anticoagulation or antiplatelet therapy currently.  I also confirmed the patient resides in the state of [place] . As per [facility] Medical Board telemedicine laws, the patient must reside in the state in which the provider is licensed.   Friddie Jetty, NP 11/01/2023, 12:16 PM [facility]

## 2023-11-01 NOTE — Telephone Encounter (Signed)
   Pre-operative Risk Assessment    Patient Name: Audrey [name]  DOB: 11-Jan-1976 MRN: [id number]   Date of last office visit: 08/07/23 Date of next office visit: Not Scheduled   Request for Surgical Clearance    Procedure:   Colonoscopy  Date of Surgery:  Clearance 11/27/23                                Surgeon:  Dr. Hilma Lucks Group or Practice Name:  [facility] Phone number:  [phone] Fax number:  [phone]   Type of Clearance Requested:   - Medical    Type of Anesthesia:  Not Indicated   Additional requests/questions:    [name]   11/01/2023, 8:36 AM

## 2023-11-02 ENCOUNTER — Telehealth (HOSPITAL_BASED_OUTPATIENT_CLINIC_OR_DEPARTMENT_OTHER): Payer: Self-pay | Admitting: *Deleted

## 2023-11-02 NOTE — Telephone Encounter (Signed)
Called patient to offer MyChart visits and was unable to leave a message .

## 2023-11-02 NOTE — Telephone Encounter (Signed)
Tried contacting patient again to schedule telephone visit for surgical clearance no answer unable to leave vm will try again at a later time
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
## 2023-11-03 ENCOUNTER — Telehealth (HOSPITAL_BASED_OUTPATIENT_CLINIC_OR_DEPARTMENT_OTHER): Payer: No Typology Code available for payment source | Admitting: Obstetrics & Gynecology

## 2023-11-03 NOTE — Telephone Encounter (Signed)
I was able to reach the pt on her other # available [phone]. Pt asked me to hold as she was on another call. I held for almost 5 minutes. I unfortunately did have to hang up as I had another pt calling me back. I will try and reach the pt later today.

## 2023-11-06 NOTE — Telephone Encounter (Signed)
See previous notes from last week. I tried to reach the pt today but got her vm. I will remove from the preop call back pool at this time. Pt needs to call back and schedule tele preop appt.   I will update the requesting office as FYI.

## 2023-11-06 NOTE — Telephone Encounter (Signed)
Pt called back and has chosen to be scheduled in office as she states our times frames will work better for her than the tele appt. Pt is aware that the appt is 11/07/23 @ 3:10 [facility] office. Pt is aware of address for [facility] office.  I will update all parties involved.

## 2023-11-06 NOTE — Progress Notes (Unsigned)
Cardiology Office Note:  .   Date:  11/07/2023  ID:  Audrey [name], DOB October 30, 1975, MRN [id number] PCP: Macy Mis, MD  [facility] Providers Cardiologist:  Tessa Lerner, DO }   History of Present Illness: .   Audrey [name] is a 48 y.o. female we are seeing for pre-operative cardiac clearance to have colonoscopy with Dr. Loreta Ave on 11/27/2023. She has a history of  Hypertension, thyroid goiter, hyperparathyroidism, iron deficiency anemia, prediabetes. Coronary CTA on 09/18/2023 revealed a coronary calcium score of 0. Echo 08/31/2023 normal EF of 60-65%.   She comes today without any cardiac complaints.  No chest pain, palpitations dyspnea on exertion, she is complaining of generalized fatigue.  She states that she has been diagnosed with hyperparathyroidism recently.  There are plans to remove her parathyroid gland and some nodules on her thyroid.  This is planned for end of February or March 2025.   ROS: As above otherwise negative  Studies Reviewed: Marland Kitchen   EKG Interpretation Date/Time:  Tuesday November 07 2023 15:38:35 EST Ventricular Rate:  84 PR Interval:  162 QRS Duration:  70 QT Interval:  354 QTC Calculation: 418 R Axis:   28  Text Interpretation: Normal sinus rhythm Nonspecific T wave abnormality When compared with ECG of 07-Aug-2023 10:57, No significant change was found Confirmed by Joni Reining [phone]) on 11/07/2023 3:40:34 PM   Echocardiogram  08/31/2023 1. Left ventricular ejection fraction, by estimation, is 60 to 65%. The  left ventricle has normal function. The left ventricle has no regional  wall motion abnormalities. Left ventricular diastolic parameters were  normal.   2. Right ventricular systolic function is normal. The right ventricular  size is normal.   3. The mitral valve is normal in structure. No evidence of mitral valve  regurgitation. No evidence of mitral stenosis.   4. The aortic valve is normal in structure. There is mild calcification   of the aortic valve. Aortic valve regurgitation is trivial. No aortic  stenosis is present.   5. The inferior vena cava is normal in size with greater than 50%  respiratory variability, suggesting right atrial pressure of 3 mmHg.    EKG Interpretation Date/Time:  Tuesday November 07 2023 15:38:35 EST Ventricular Rate:  84 PR Interval:  162 QRS Duration:  70 QT Interval:  354 QTC Calculation: 418 R Axis:   28  Text Interpretation: Normal sinus rhythm Nonspecific T wave abnormality When compared with ECG of 07-Aug-2023 10:57, No significant change was found Confirmed by Joni Reining [phone]) on 11/07/2023 3:40:34 PM    Physical Exam:   VS:  BP 120/70 (BP Location: Left Arm, Patient Position: Sitting, Cuff Size: Normal)   Pulse 87   Ht 5\' 1"  (1.549 m)   Wt 216 lb (98 kg)   SpO2 97%   BMI 40.81 kg/m    Wt Readings from Last 3 Encounters:  11/07/23 216 lb (98 kg)  11/07/23 217 lb (98.4 kg)  10/19/23 215 lb 12.8 oz (97.9 kg)    GEN: Well nourished, well developed in no acute distress NECK: No JVD; No carotid bruits CARDIAC: RRR, no murmurs, rubs, gallops RESPIRATORY:  Clear to auscultation without rales, wheezing or rhonchi  ABDOMEN: Soft, non-tender, non-distended EXTREMITIES:  No edema; No deformity   ASSESSMENT AND PLAN: .   1.Pre-Op Clearance  According to the Revised Cardiac Risk Index (RCRI), her Perioperative Risk of Major Cardiac Event is (%): 0.4  Her Functional Capacity in METs is: 8.97 according to  the Duke Activity Status Index (DASI).  Patient is slow down due to chronic musculoskeletal pain related to hyperparathyroidism.  She is not as active currently but from a cardiac standpoint she would be able to do the usual functional exertional activities otherwise.  Therefore, based on ACC/AHA guidelines, patient would be at acceptable risk for the planned procedure without further cardiovascular testing. I will route this recommendation to the requesting party via  Epic fax function.     2.  Hypertension: BP is well controlled. No changes on her medication regimen.   3.Hyperparathyroidism: (Possible Pre-Op). Planned for surgical removal of parathyroid in Feb or March 2025.  Should not need additional appointment for clearance, can have phone clearance.    Signed, Bettey Mare. Liborio Nixon, ANP, AACC

## 2023-11-07 ENCOUNTER — Encounter: Payer: Self-pay | Admitting: Adult Health

## 2023-11-07 ENCOUNTER — Ambulatory Visit (INDEPENDENT_AMBULATORY_CARE_PROVIDER_SITE_OTHER): Payer: No Typology Code available for payment source | Admitting: Family Medicine

## 2023-11-07 ENCOUNTER — Ambulatory Visit
Admission: RE | Admit: 2023-11-07 | Discharge: 2023-11-07 | Disposition: A | Discharge status: Home / Self Care | Payer: No Typology Code available for payment source | Source: Ambulatory Visit | Attending: Interventional Radiology | Admitting: Interventional Radiology

## 2023-11-07 ENCOUNTER — Ambulatory Visit: Payer: No Typology Code available for payment source | Attending: Adult Health | Admitting: Adult Health

## 2023-11-07 VITALS — BP 134/76 | HR 97 | Ht 61.0 in | Wt 217.0 lb

## 2023-11-07 VITALS — BP 120/70 | HR 87 | Ht 61.0 in | Wt 216.0 lb

## 2023-11-07 DIAGNOSIS — D259 Leiomyoma of uterus, unspecified: Secondary | ICD-10-CM

## 2023-11-07 DIAGNOSIS — E213 Hyperparathyroidism, unspecified: Secondary | ICD-10-CM

## 2023-11-07 DIAGNOSIS — Z01810 Encounter for preprocedural cardiovascular examination: Secondary | ICD-10-CM | POA: Diagnosis not present

## 2023-11-07 DIAGNOSIS — I10 Essential (primary) hypertension: Secondary | ICD-10-CM | POA: Diagnosis not present

## 2023-11-07 DIAGNOSIS — G43911 Migraine, unspecified, intractable, with status migrainosus: Secondary | ICD-10-CM

## 2023-11-07 HISTORY — PX: IR RADIOLOGIST EVAL & MGMT: IMG5224
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
## 2023-11-07 NOTE — Patient Instructions (Addendum)
Ask Dr. Hyacinth Meeker about Effexor When you know you having surgery give [place] a heads up You'll do great

## 2023-11-07 NOTE — Assessment & Plan Note (Signed)
Will be having surgical intervention at some point in the near future.  Discussed which activities to do and which ones to avoid.  We discussed avoiding certain activities until then.  I do believe though that the hypercalcemia is likely contributing to more of the aches and pains as well as some of patient's difficulty with her cognition.  Do believe it is possibly contributing to the leg pain as well.  Do feel like we need this resolved will for we would do any other type of aggressive testing.  Patient is in agreement.  Can follow-up with me as needed.  Total time discussed with patient and reviewing patient's chart 32 minutes

## 2023-11-07 NOTE — Patient Instructions (Signed)
Medication Instructions:  No changes *If you need a refill on your cardiac medications before your next appointment, please call your pharmacy*   Lab Work: No Labs If you have labs (blood work) drawn today and your tests are completely normal, you will receive your results only by: MyChart Message (if you have MyChart) OR A paper copy in the mail If you have any lab test that is abnormal or we need to change your treatment, we will call you to review the results.   Testing/Procedures: No Testing   Follow-Up: At [facility], you and your health needs are our priority.  As part of our continuing mission to provide you with exceptional heart care, we have created designated Provider Care Teams.  These Care Teams include your primary Cardiologist (physician) and Advanced Practice Providers (APPs -  Physician Assistants and Nurse Practitioners) who all work together to provide you with the care you need, when you need it.  We recommend signing up for the patient portal called "MyChart".  Sign up information is provided on this After Visit Summary.  MyChart is used to connect with patients for Virtual Visits (Telemedicine).  Patients are able to view lab/test results, encounter notes, upcoming appointments, etc.  Non-urgent messages can be sent to your provider as well.   To learn more about what you can do with MyChart, go to [email or web address].    Your next appointment:   6 month(s)  Provider:   Tessa Lerner, DO

## 2023-11-09 ENCOUNTER — Ambulatory Visit (HOSPITAL_BASED_OUTPATIENT_CLINIC_OR_DEPARTMENT_OTHER): Payer: No Typology Code available for payment source | Admitting: Obstetrics & Gynecology

## 2023-11-09 ENCOUNTER — Encounter (HOSPITAL_BASED_OUTPATIENT_CLINIC_OR_DEPARTMENT_OTHER): Payer: Self-pay | Admitting: Obstetrics & Gynecology

## 2023-11-09 VITALS — BP 163/81 | HR 71 | Wt 218.0 lb

## 2023-11-09 DIAGNOSIS — Z79899 Other long term (current) drug therapy: Secondary | ICD-10-CM

## 2023-11-09 DIAGNOSIS — N926 Irregular menstruation, unspecified: Secondary | ICD-10-CM
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
## 2023-11-10 ENCOUNTER — Encounter (HOSPITAL_BASED_OUTPATIENT_CLINIC_OR_DEPARTMENT_OTHER): Payer: Self-pay | Admitting: Obstetrics & Gynecology

## 2023-11-10 LAB — COMPREHENSIVE METABOLIC PANEL
ALT: 31 [IU]/L (ref 0–32)
AST: 20 [IU]/L (ref 0–40)
Albumin: 4.2 g/dL (ref 3.9–4.9)
Alkaline Phosphatase: 91 [IU]/L (ref 44–121)
BUN/Creatinine Ratio: 20 (ref 9–23)
BUN: 17 mg/dL (ref 6–24)
Bilirubin Total: 0.3 mg/dL (ref 0.0–1.2)
CO2: 24 mmol/L (ref 20–29)
Calcium: 11.1 mg/dL — ABNORMAL HIGH (ref 8.7–10.2)
Chloride: 104 mmol/L (ref 96–106)
Creatinine, Ser: 0.87 mg/dL (ref 0.57–1.00)
Globulin, Total: 2.9 g/dL (ref 1.5–4.5)
Glucose: 90 mg/dL (ref 70–99)
Potassium: 4.1 mmol/L (ref 3.5–5.2)
Sodium: 144 mmol/L (ref 134–144)
Total Protein: 7.1 g/dL (ref 6.0–8.5)
eGFR: 82 mL/min/{1.73_m2} (ref >59)

## 2023-11-10 LAB — FOLLICLE STIMULATING HORMONE: FSH: 76.7 m[IU]/mL

## 2023-11-12 ENCOUNTER — Encounter (HOSPITAL_BASED_OUTPATIENT_CLINIC_OR_DEPARTMENT_OTHER): Payer: Self-pay | Admitting: Obstetrics & Gynecology

## 2023-11-15 ENCOUNTER — Telehealth: Payer: Self-pay | Admitting: Family Medicine

## 2023-11-15 NOTE — Telephone Encounter (Signed)
Just FYI, pt surgery is scheduled at [facility] for 12/11/2023.

## 2023-12-07 ENCOUNTER — Encounter (HOSPITAL_COMMUNITY): Payer: Self-pay | Admitting: Surgery

## 2023-12-07 NOTE — Patient Instructions (Signed)
SURGICAL WAITING ROOM VISITATION Patients having surgery or a procedure may have no more than 2 support people in the waiting area - these visitors may rotate in the visitor waiting room.   Due to an increase in RSV and influenza rates and associated hospitalizations, children ages 31 and under may not visit patients in [facility] hospitals. If the patient needs to stay at the hospital during part of their recovery, the visitor guidelines for inpatient rooms apply.  PRE-OP VISITATION  Pre-op nurse will coordinate an appropriate time for 1 support person to accompany the patient in pre-op.  This support person may not rotate.  This visitor will be contacted when the time is appropriate for the visitor to come back in the pre-op area.  Please refer to the [facility] website for the visitor guidelines for Inpatients (after your surgery is over and you are in a regular room).  You are not required to quarantine at this time prior to your surgery. However, you must do this: Hand Hygiene often Do NOT share personal items Notify your provider if you are in close contact with someone who has COVID or you develop fever 100.4 or greater, new onset of sneezing, cough, sore throat, shortness of breath or body aches.  If you test positive for Covid or have been in contact with anyone that has tested positive in the last 10 days please notify you surgeon.    Your procedure is scheduled on:  Monday  December 11, 2023  Report to [facility] Main Entrance: Leota Jacobsen entrance where the [employer] is available.   Report to admitting at:  07:15   AM  Call this number if you have any questions or problems the morning of surgery [phone]  FOLLOW ANY ADDITIONAL PRE OP INSTRUCTIONS YOU RECEIVED FROM YOUR SURGEON'S OFFICE!!!  Do not eat food after Midnight the night prior to your surgery/procedure.  After Midnight you may have the following liquids until   06:30 AM  DAY OF SURGERY  Clear  Liquid Diet Water Black Coffee (sugar ok, NO MILK/CREAM OR CREAMERS)  Tea (sugar ok, NO MILK/CREAM OR CREAMERS) regular and decaf                             Plain Jell-O  with no fruit (NO RED)                                           Fruit ices (not with fruit pulp, NO RED)                                     Popsicles (NO RED)                                                                  Juice: NO CITRUS JUICES: only apple, WHITE grape, WHITE cranberry Sports drinks like Gatorade or Powerade (NO RED)                 Oral Hygiene is also important to reduce  your risk of infection.        Remember - BRUSH YOUR TEETH THE MORNING OF SURGERY WITH YOUR REGULAR TOOTHPASTE  Do NOT smoke after Midnight the night before surgery.  STOP TAKING all Vitamins, Herbs and supplements 1 week before your surgery.   Take ONLY these medicines the morning of surgery with A SIP OF WATER: Tylenol if needed for pain. You may use your Albuterol inhaler if needed. (Please bring your inhaler with you on the surgery day)   If You have been diagnosed with Sleep Apnea - Bring CPAP mask and tubing day of surgery. We will provide you with a CPAP machine on the day of your surgery.                   You may not have any metal on your body including hair pins, jewelry, and body piercing  Do not wear make-up, lotions, powders, perfumes or deodorant  Do not wear nail polish including gel and S&S, artificial / acrylic nails, or any other type of covering on natural nails including finger and toenails. If you have artificial nails, gel coating, etc., that needs to be removed by a nail salon, Please have this removed prior to surgery. Not doing so may mean that your surgery could be cancelled or delayed if the Surgeon or anesthesia staff feels like they are unable to monitor you safely.   Do not shave 48 hours prior to surgery to avoid nicks in your skin which may contribute to postoperative infections.   Contacts,  Hearing Aids, dentures or bridgework may not be worn into surgery. DENTURES WILL BE REMOVED PRIOR TO SURGERY PLEASE DO NOT APPLY "Poly grip" OR ADHESIVES!!!  You may bring a small overnight bag with you on the day of surgery, only pack items that are not valuable. [facility] IS NOT RESPONSIBLE   FOR VALUABLES THAT ARE LOST OR STOLEN.   Do not bring your home medications to the hospital. The Pharmacy will dispense medications listed on your medication list to you during your admission in the Hospital.  Please read over the following fact sheets you were given: IF YOU HAVE QUESTIONS ABOUT YOUR PRE-OP INSTRUCTIONS, PLEASE CALL [phone]   [facility] - Preparing for Surgery Before surgery, you can play an important role.  Because skin is not sterile, your skin needs to be as free of germs as possible.  You can reduce the number of germs on your skin by washing with CHG (chlorahexidine gluconate) soap before surgery.  CHG is an antiseptic cleaner which kills germs and bonds with the skin to continue killing germs even after washing. Please DO NOT use if you have an allergy to CHG or antibacterial soaps.  If your skin becomes reddened/irritated stop using the CHG and inform your nurse when you arrive at Short Stay. Do not shave (including legs and underarms) for at least 48 hours prior to the first CHG shower.  You may shave your face/neck.  Please follow these instructions carefully:  1.  Shower with CHG Soap the night before surgery and the  morning of surgery.  2.  If you choose to wash your hair, wash your hair first as usual with your normal  shampoo.  3.  After you shampoo, rinse your hair and body thoroughly to remove the shampoo.  4.  Use CHG as you would any other liquid soap.  You can apply chg directly to the skin and wash.  Gently with a scrungie or clean washcloth.  5.  Apply the CHG Soap to your body ONLY FROM THE NECK DOWN.   Do not use on face/ open                            Wound or open sores. Avoid contact with eyes, ears mouth and genitals (private parts).                       Wash face,  Genitals (private parts) with your normal soap.             6.  Wash thoroughly, paying special attention to the area where your  surgery  will be performed.  7.  Thoroughly rinse your body with warm water from the neck down.  8.  DO NOT shower/wash with your normal soap after using and rinsing off the CHG Soap.            9.  Pat yourself dry with a clean towel.            10.  Wear clean pajamas.            11.  Place clean sheets on your bed the night of your first shower and do not  sleep with pets.  ON THE DAY OF SURGERY : Do not apply any lotions/deodorants the morning of surgery.  Please wear clean clothes to the hospital/surgery center.     FAILURE TO FOLLOW THESE INSTRUCTIONS MAY RESULT IN THE CANCELLATION OF YOUR SURGERY  PATIENT SIGNATURE_________________________________  NURSE SIGNATURE__________________________________  ________________________________________________________________________

## 2023-12-07 NOTE — Progress Notes (Signed)
COVID Vaccine received:  []  No [x]  Yes Date of any COVID positive Test in last 90 days:  PCP - Delbert Harness, MD [phone] (Work)  [phone] (Fax)  Cardiologist - Tessa Lerner, DO, Joni Reining, NP  cardiac clearance in 11-07-23 Epic note  Chest x-ray - 02-28-2019  2v  Epic EKG -  11-07-2023  Epic Stress Test -  ECHO - 08-31-2023  Epic Cardiac Cath -   PCR screen: []  Ordered & Completed []   No Order but Needs PROFEND     [x]   N/A for this surgery  Surgery Plan:  []  Ambulatory   [x]  Outpatient in bed  []  Admit Anesthesia:    [x]  General  []  Spinal  []   Choice []   MAC  Pacemaker / ICD device [x]  No []  Yes   Spinal Cord Stimulator:[x]  No []  Yes       History of Sleep Apnea? []  No []  Yes   CPAP used?- [x]  No []  Yes    Does the patient monitor blood sugar?   []  N/A   []  No []  Yes  Patient has: []  NO Hx DM   [x]  Pre-DM   []  DM1  []   DM2 Last A1c was:  6.0  on  09-11-2023     Blood Thinner / Instructions: none Aspirin Instructions: none  ERAS Protocol Ordered: []  No  [x]  Yes PRE-SURGERY []  ENSURE  []  G2   [x]  No Drink Ordered Patient is to be NPO after: 0630  Dental hx: []  Dentures:  []  N/A      []  Bridge or Partial:                   []  Loose or Damaged teeth:   Comments:   Activity level: Patient is able / unable to climb a flight of stairs without difficulty; []  No CP  []  No SOB, but would have ___   Patient can / can not perform ADLs without assistance.   Anesthesia review: Pre-DM, asthma, Anxiety, anemia, migraines  Patient denies shortness of breath, fever, cough and chest pain at PAT appointment.  Patient verbalized understanding and agreement to the Pre-Surgical Instructions that were given to them at this PAT appointment. Patient was also educated of the need to review these PAT instructions again prior to his/her surgery.I reviewed the appropriate phone numbers to call if they have any and questions or concerns.

## 2023-12-07 NOTE — H&P (Signed)
PROVIDER: Mercede Rollo Myra Rude, MD   Chief Complaint: Follow-up (Primary hyperparathyroidism)  History of Present Illness:  Patient returns for follow-up after undergoing diagnostic studies for primary hyperparathyroidism. Ultrasound from February 2024 failed to reveal any evidence of parathyroid adenoma. In November 2024 we did a nuclear medicine parathyroid scan with sestamibi which was also negative for parathyroid adenoma. Finally the patient underwent a 4D CT scan of the neck in December 2024 which again failed to show evidence of parathyroid adenoma. Laboratory studies are consistent with primary hyperparathyroidism with a calcium level of 11.1 and an intact PTH level of 96. Patient presents today to review these results and to discuss strategies for further management. Patient continues to note significant fatigue. She also notes bone and joint discomfort.  Review of Systems: A complete review of systems was obtained from the patient. I have reviewed this information and discussed as appropriate with the patient. See HPI as well for other ROS.  Review of Systems  Constitutional: Positive for malaise/fatigue.  Musculoskeletal: Positive for joint pain.    Medical History: Past Medical History:  Diagnosis Date  Anemia  Anxiety  Asthma, unspecified asthma severity, unspecified whether complicated, unspecified whether persistent (HHS-HCC)  Hypertension  Thyroid disease   Patient Active Problem List  Diagnosis  Primary hyperparathyroidism (CMS/HHS-HCC)   Past Surgical History:  Procedure Laterality Date  CESAREAN SECTION  ENDOMETRIAL BIOPSY  EXCISION GANGLION CYST WRIST PRIMARY  Ir embo tumor organ ischemia infarct inc guide roadmapping  Ir fluoro guided needle plc aspiration/injection loc  Ir radiologist eval & mgmt  uterine emboltzation    Allergies  Allergen Reactions  Fluticasone Propion-Salmeterol Anaphylaxis, Nausea, Other (See Comments) and Shortness Of  Breath  Weakness headaches  Macrolide Antibiotics Other (See Comments) and Swelling  Made eye infection worse  Made eye infection worse Made eye infection worse  Made eye swelling worse  Penicillins Itching, Other (See Comments), Rash and Swelling  All cillins per patient  All cillins per patient Eye swelling  Eye swelling  Shellfish Derived Anaphylaxis  Amlodipine Swelling  Lip swelling  Diphenhydramine Hcl Other (See Comments)  Latex Rash  Lisinopril Other (See Comments) and Swelling  Caused lips to swell  Neomycin Swelling  Nitrofurantoin Other (See Comments)  Nystatin Other (See Comments)  Made eye infection worse  Made eye infection worse Made eye infection worse  Diphenhydramine-Pseudoephed Dizziness and Other (See Comments)  Aspirin Abdominal Pain, Other (See Comments) and Nausea   Current Outpatient Medications on File Prior to Visit  Medication Sig Dispense Refill  acetaminophen (TYLENOL) 500 MG tablet Take 1,000 mg by mouth every 6 (six) hours as needed  ADVAIR HFA 115-21 mcg/actuation inhaler Inhale 2 inhalations into the lungs 2 (two) times daily  albuterol MDI, PROVENTIL, VENTOLIN, PROAIR, HFA 90 mcg/actuation inhaler Inhale 2 inhalations into the lungs  EPINEPHrine (EPIPEN) 0.3 mg/0.3 mL auto-injector Inject 0.3 mg into the muscle  ferrous gluconate (FERGON) 324 MG tablet Take 324 mg by mouth  hydrocortisone 2.5 % cream Apply 1 Application topically  ibuprofen (MOTRIN) 200 MG tablet Take 200 mg by mouth every 6 (six) hours as needed  losartan (COZAAR) 25 MG tablet Take 50 mg by mouth once daily  meclizine (ANTIVERT) 25 mg tablet Take 25 mg by mouth 3 (three) times daily as needed  meloxicam (MOBIC) 15 MG tablet Take 1 tablet by mouth once daily  metoprolol tartrate (LOPRESSOR) 25 MG tablet Take 25 mg by mouth  norethindrone (MICRONOR) 0.35 mg tablet Take 5 mg  by mouth 2 (two) times daily   No current facility-administered medications on file prior to  visit.   Family History  Problem Relation Age of Onset  High blood pressure (Hypertension) Mother  Hyperlipidemia (Elevated cholesterol) Father  High blood pressure (Hypertension) Father  Diabetes Sister    Social History   Tobacco Use  Smoking Status Never  Smokeless Tobacco Never    Social History   Socioeconomic History  Marital status: Married  Tobacco Use  Smoking status: Never  Smokeless tobacco: Never  Vaping Use  Vaping status: Never Used  Substance and Sexual Activity  Alcohol use: Yes  Drug use: Never   Social Drivers of Corporate investment banker Strain: Medium Risk (11/13/2022)  Received from [facility]  Overall Financial Resource Strain (CARDIA)  Difficulty of Paying Living Expenses: Somewhat hard  Food Insecurity: Food Insecurity Present (11/13/2022)  Received from [facility]  Hunger Vital Sign  Worried About Running Out of Food in the Last Year: Sometimes true  Ran Out of Food in the Last Year: Sometimes true  Transportation Needs: No Transportation Needs (11/13/2022)  Received from [facility] - Transportation  Lack of Transportation (Medical): No  Lack of Transportation (Non-Medical): No  Physical Activity: Insufficiently Active (11/13/2022)  Received [facility]  Exercise Vital Sign  Days of Exercise per Week: 2 days  Minutes of Exercise per Session: 10 min  Stress: No Stress Concern Present (11/13/2022)  Received from [facility] - Occupational Stress Questionnaire  Feeling of Stress : Not at all  Social Connections: Socially Integrated (11/13/2022)  Received from [facility]  Social Network  How would you rate your social network (family, work, friends)?: Good participation with social networks   Objective:   Vitals:  PainSc: 0-No pain   There is no height or weight on file to calculate BMI.  Physical Exam   GENERAL APPEARANCE Comfortable, no acute  issues Development: normal Gross deformities: none  SKIN Rash, lesions, ulcers: none Induration, erythema: none Nodules: none palpable  EYES Conjunctiva and lids: normal Pupils: equal  EARS, NOSE, MOUTH, THROAT External ears: no lesion or deformity External nose: no lesion or deformity Hearing: grossly normal  NECK Symmetric: yes Trachea: midline Thyroid: no palpable nodules in the thyroid bed  CHEST/CV Not assessed  ABDOMEN Not assessed  GENITOURINARY/RECTAL Not assessed  MUSCULOSKELETAL Station and gait: normal Digits and nails: no clubbing or cyanosis Muscle strength: grossly normal all extremities Deformity: none  LYMPHATIC Cervical: none palpable Supraclavicular: none palpable  PSYCHIATRIC Oriented to person, place, and time: yes Mood and affect: normal for situation Judgment and insight: appropriate for situation   Assessment and Plan:   Primary hyperparathyroidism (CMS/HHS-HCC)  Patient presents today to review the results of her diagnostic studies including an ultrasound exam, nuclear medicine parathyroid scan with sestamibi, and 4D CT scan of the neck. All of these failed to reveal evidence of parathyroid adenoma. However, laboratory studies are very consistent with primary hyperparathyroidism and the patient is symptomatic.  I have recommended proceeding with neck exploration and parathyroidectomy. Today we discussed that procedure. We discussed the size and location of the surgical incision. We discussed the likelihood of success. We discussed the overnight hospital stay which will likely be required. We discussed her postoperative recovery and returned to work and activities. The patient understands and wishes to proceed with surgery in the near future.  We discussed potential other evaluations if the surgery did not successfully find  the parathyroid adenoma. We discussed the possibility of MRI scan. We also discussed the possibility of selective  venous sampling.  Orders will be entered and the schedulers will contact the patient to work out a date for surgery that is convenient for the patient.   Darnell Level, MD [facility] practice Office: [phone]

## 2023-12-08 ENCOUNTER — Encounter (HOSPITAL_COMMUNITY): Payer: Self-pay | Admitting: *Deleted

## 2023-12-08 ENCOUNTER — Other Ambulatory Visit: Payer: Self-pay

## 2023-12-08 ENCOUNTER — Encounter (HOSPITAL_COMMUNITY)
Admission: RE | Admit: 2023-12-08 | Discharge: 2023-12-08 | Disposition: A | Discharge status: Home / Self Care | Payer: No Typology Code available for payment source | Source: Ambulatory Visit | Attending: Surgery | Admitting: Surgery

## 2023-12-08 VITALS — BP 145/82 | HR 62 | Temp 97.9°F | Resp 20 | Ht 61.5 in | Wt 214.0 lb

## 2023-12-08 DIAGNOSIS — R7303 Prediabetes: Secondary | ICD-10-CM | POA: Diagnosis not present

## 2023-12-08 DIAGNOSIS — E213 Hyperparathyroidism, unspecified: Secondary | ICD-10-CM | POA: Diagnosis not present

## 2023-12-08 DIAGNOSIS — I10 Essential (primary) hypertension: Secondary | ICD-10-CM | POA: Insufficient documentation

## 2023-12-08 DIAGNOSIS — Z01812 Encounter for preprocedural laboratory examination: Secondary | ICD-10-CM | POA: Insufficient documentation

## 2023-12-08 DIAGNOSIS — Z01818 Encounter for other preprocedural examination: Secondary | ICD-10-CM

## 2023-12-08 HISTORY — DX: Anxiety disorder, unspecified: F41.9

## 2023-12-08 HISTORY — DX: Prediabetes: R73.03

## 2023-12-08 HISTORY — DX: Gastro-esophageal reflux disease without esophagitis: K21.9

## 2023-12-08 HISTORY — DX: Depression, unspecified: F32.A

## 2023-12-08 HISTORY — DX: Unspecified osteoarthritis, unspecified site: M19.90

## 2023-12-08 LAB — BASIC METABOLIC PANEL
Anion gap: 10 (ref 5–15)
BUN: 18 mg/dL (ref 6–20)
CO2: 24 mmol/L (ref 22–32)
Calcium: 10.7 mg/dL — ABNORMAL HIGH (ref 8.9–10.3)
Chloride: 103 mmol/L (ref 98–111)
Creatinine, Ser: 0.83 mg/dL (ref 0.44–1.00)
GFR, Estimated: >60 mL/min (ref >60)
Glucose, Bld: 93 mg/dL (ref 70–99)
Potassium: 3.7 mmol/L (ref 3.5–5.1)
Sodium: 137 mmol/L (ref 135–145)

## 2023-12-08 LAB — CBC
HCT: 39.5 % (ref 36.0–46.0)
Hemoglobin: 13.2 g/dL (ref 12.0–15.0)
MCH: 30.8 pg (ref 26.0–34.0)
MCHC: 33.4 g/dL (ref 30.0–36.0)
MCV: 92.3 fL (ref 80.0–100.0)
Platelets: 289 10*3/uL (ref 150–400)
RBC: 4.28 MIL/uL (ref 3.87–5.11)
RDW: 12.4 % (ref 11.5–15.5)
WBC: 7.1 10*3/uL (ref 4.0–10.5)
nRBC: 0 % (ref 0.0–0.2)
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
## 2023-12-11 ENCOUNTER — Encounter (HOSPITAL_COMMUNITY): Payer: Self-pay | Admitting: Surgery

## 2023-12-11 ENCOUNTER — Other Ambulatory Visit: Payer: Self-pay

## 2023-12-11 ENCOUNTER — Ambulatory Visit (HOSPITAL_BASED_OUTPATIENT_CLINIC_OR_DEPARTMENT_OTHER): Payer: Self-pay | Admitting: Anesthesiology

## 2023-12-11 ENCOUNTER — Ambulatory Visit (HOSPITAL_COMMUNITY): Payer: No Typology Code available for payment source | Admitting: Physician Assistant

## 2023-12-11 ENCOUNTER — Ambulatory Visit (HOSPITAL_COMMUNITY)
Admission: RE | Admit: 2023-12-11 | Discharge: 2023-12-12 | Disposition: A | Discharge status: Home / Self Care | Payer: No Typology Code available for payment source | Attending: Surgery | Admitting: Surgery

## 2023-12-11 ENCOUNTER — Encounter (HOSPITAL_COMMUNITY)
Admission: RE | Disposition: A | Discharge status: Home / Self Care | Payer: Self-pay | Source: Home / Self Care | Attending: Surgery

## 2023-12-11 DIAGNOSIS — E210 Primary hyperparathyroidism: Secondary | ICD-10-CM | POA: Diagnosis present

## 2023-12-11 DIAGNOSIS — E041 Nontoxic single thyroid nodule: Secondary | ICD-10-CM | POA: Diagnosis not present

## 2023-12-11 DIAGNOSIS — J45909 Unspecified asthma, uncomplicated: Secondary | ICD-10-CM

## 2023-12-11 DIAGNOSIS — Z6839 Body mass index (BMI) 39.0-39.9, adult: Secondary | ICD-10-CM | POA: Diagnosis not present

## 2023-12-11 DIAGNOSIS — Z5986 Financial insecurity: Secondary | ICD-10-CM | POA: Insufficient documentation

## 2023-12-11 DIAGNOSIS — Z01818 Encounter for other preprocedural examination: Secondary | ICD-10-CM

## 2023-12-11 DIAGNOSIS — K219 Gastro-esophageal reflux disease without esophagitis: Secondary | ICD-10-CM | POA: Insufficient documentation

## 2023-12-11 DIAGNOSIS — Z79899 Other long term (current) drug therapy: Secondary | ICD-10-CM | POA: Insufficient documentation

## 2023-12-11 DIAGNOSIS — I10 Essential (primary) hypertension: Secondary | ICD-10-CM

## 2023-12-11 DIAGNOSIS — Z5941 Food insecurity: Secondary | ICD-10-CM | POA: Diagnosis not present

## 2023-12-11 DIAGNOSIS — E66813 Obesity, class 3: Secondary | ICD-10-CM | POA: Insufficient documentation

## 2023-12-11 HISTORY — PX: PARATHYROIDECTOMY: SHX19

## 2023-12-11 LAB — POCT PREGNANCY, URINE: Preg Test, Ur: NEGATIVE

## 2023-12-11 SURGERY — PARATHYROIDECTOMY
Anesthesia: General | Laterality: Bilateral

## 2023-12-11 MED ORDER — PROPOFOL 10 MG/ML IV BOLUS
INTRAVENOUS | Status: AC
  Filled 2023-12-11: qty 20

## 2023-12-11 MED ORDER — CHLORHEXIDINE GLUCONATE 0.12 % MT SOLN
15.0000 mL | Freq: Once | OROMUCOSAL | Status: AC
  Administered 2023-12-11: 15 mL via OROMUCOSAL

## 2023-12-11 MED ORDER — FENTANYL CITRATE (PF) 100 MCG/2ML IJ SOLN
INTRAMUSCULAR | Status: AC
  Filled 2023-12-11: qty 2

## 2023-12-11 MED ORDER — ROCURONIUM BROMIDE 100 MG/10ML IV SOLN
INTRAVENOUS | Status: DC | PRN
  Administered 2023-12-11: 50 mg via INTRAVENOUS

## 2023-12-11 MED ORDER — ONDANSETRON 4 MG PO TBDP
4.0000 mg | ORAL_TABLET | Freq: Four times a day (QID) | ORAL | Status: DC | PRN
  Administered 2023-12-12: 4 mg via ORAL
  Filled 2023-12-11: qty 1

## 2023-12-11 MED ORDER — OXYCODONE HCL 5 MG/5ML PO SOLN
ORAL | Status: AC
  Filled 2023-12-11: qty 5

## 2023-12-11 MED ORDER — 0.9 % SODIUM CHLORIDE (POUR BTL) OPTIME
TOPICAL | Status: DC | PRN
  Administered 2023-12-11: 1000 mL

## 2023-12-11 MED ORDER — MIDAZOLAM HCL 5 MG/5ML IJ SOLN
INTRAMUSCULAR | Status: DC | PRN
  Administered 2023-12-11: 2 mg via INTRAVENOUS

## 2023-12-11 MED ORDER — LACTATED RINGERS IV SOLN: INTRAVENOUS | Status: DC

## 2023-12-11 MED ORDER — ONDANSETRON HCL 4 MG/2ML IJ SOLN
INTRAMUSCULAR | Status: DC | PRN
  Administered 2023-12-11: 4 mg via INTRAVENOUS

## 2023-12-11 MED ORDER — SODIUM CHLORIDE 0.9 % IV SOLN: 12.5000 mg | INTRAVENOUS | Status: DC | PRN

## 2023-12-11 MED ORDER — LOSARTAN POTASSIUM 50 MG PO TABS
100.0000 mg | ORAL_TABLET | Freq: Every morning | ORAL | Status: DC
  Administered 2023-12-11 – 2023-12-12 (×2): 100 mg via ORAL
  Filled 2023-12-11 (×2): qty 2

## 2023-12-11 MED ORDER — SODIUM CHLORIDE 0.45 % IV SOLN: INTRAVENOUS | Status: DC

## 2023-12-11 MED ORDER — TRAMADOL HCL 50 MG PO TABS
ORAL_TABLET | ORAL | Status: AC
  Filled 2023-12-11: qty 1

## 2023-12-11 MED ORDER — OXYCODONE HCL 5 MG PO TABS: 5.0000 mg | ORAL_TABLET | Freq: Once | ORAL | Status: AC | PRN

## 2023-12-11 MED ORDER — HYDROMORPHONE HCL 1 MG/ML IJ SOLN
INTRAMUSCULAR | Status: AC
  Filled 2023-12-11: qty 1

## 2023-12-11 MED ORDER — HYDROMORPHONE HCL 1 MG/ML IJ SOLN: 1.0000 mg | INTRAMUSCULAR | Status: DC | PRN

## 2023-12-11 MED ORDER — PHENYLEPHRINE 80 MCG/ML (10ML) SYRINGE FOR IV PUSH (FOR BLOOD PRESSURE SUPPORT)
PREFILLED_SYRINGE | INTRAVENOUS | Status: AC
  Filled 2023-12-11: qty 10

## 2023-12-11 MED ORDER — LIDOCAINE HCL (PF) 2 % IJ SOLN
INTRAMUSCULAR | Status: DC | PRN
  Administered 2023-12-11: 60 mg via INTRADERMAL

## 2023-12-11 MED ORDER — CHLORHEXIDINE GLUCONATE CLOTH 2 % EX PADS
6.0000 | MEDICATED_PAD | Freq: Once | CUTANEOUS | Status: DC
Start: 2023-12-11 — End: 2023-12-11

## 2023-12-11 MED ORDER — ONDANSETRON HCL 4 MG/2ML IJ SOLN
INTRAMUSCULAR | Status: AC
  Filled 2023-12-11: qty 2

## 2023-12-11 MED ORDER — FENTANYL CITRATE (PF) 100 MCG/2ML IJ SOLN
INTRAMUSCULAR | Status: DC | PRN
Start: 2023-12-11 — End: 2023-12-11
  Administered 2023-12-11: 25 ug via INTRAVENOUS
  Administered 2023-12-11: 50 ug via INTRAVENOUS
  Administered 2023-12-11: 25 ug via INTRAVENOUS
  Administered 2023-12-11 (×2): 50 ug via INTRAVENOUS
  Administered 2023-12-11: 100 ug via INTRAVENOUS

## 2023-12-11 MED ORDER — ACETAMINOPHEN 650 MG RE SUPP: 650.0000 mg | Freq: Four times a day (QID) | RECTAL | Status: DC | PRN

## 2023-12-11 MED ORDER — HEMOSTATIC AGENTS (NO CHARGE) OPTIME
TOPICAL | Status: DC | PRN
  Administered 2023-12-11: 1

## 2023-12-11 MED ORDER — ORAL CARE MOUTH RINSE: 15.0000 mL | Freq: Once | OROMUCOSAL | Status: AC

## 2023-12-11 MED ORDER — LIDOCAINE HCL (PF) 2 % IJ SOLN
INTRAMUSCULAR | Status: AC
  Filled 2023-12-11: qty 5

## 2023-12-11 MED ORDER — ACETAMINOPHEN 325 MG PO TABS
650.0000 mg | ORAL_TABLET | Freq: Four times a day (QID) | ORAL | Status: DC | PRN
  Administered 2023-12-11: 650 mg via ORAL

## 2023-12-11 MED ORDER — ALBUTEROL SULFATE (2.5 MG/3ML) 0.083% IN NEBU: 2.5000 mg | INHALATION_SOLUTION | RESPIRATORY_TRACT | Status: DC | PRN

## 2023-12-11 MED ORDER — CIPROFLOXACIN IN D5W 400 MG/200ML IV SOLN
400.0000 mg | INTRAVENOUS | Status: AC
  Administered 2023-12-11: 400 mg via INTRAVENOUS
  Filled 2023-12-11: qty 200

## 2023-12-11 MED ORDER — AMISULPRIDE (ANTIEMETIC) 5 MG/2ML IV SOLN: 10.0000 mg | Freq: Once | INTRAVENOUS | Status: DC | PRN

## 2023-12-11 MED ORDER — ACETAMINOPHEN 325 MG PO TABS
ORAL_TABLET | ORAL | Status: AC
Start: 2023-12-11 — End: 2023-12-11
  Filled 2023-12-11: qty 2

## 2023-12-11 MED ORDER — FENTANYL CITRATE PF 50 MCG/ML IJ SOSY
PREFILLED_SYRINGE | INTRAMUSCULAR | Status: AC
  Administered 2023-12-11: 25 ug via INTRAVENOUS
  Filled 2023-12-11: qty 3

## 2023-12-11 MED ORDER — DEXMEDETOMIDINE HCL IN NACL 80 MCG/20ML IV SOLN
INTRAVENOUS | Status: DC | PRN
  Administered 2023-12-11 (×2): 4 ug via INTRAVENOUS

## 2023-12-11 MED ORDER — SUGAMMADEX SODIUM 200 MG/2ML IV SOLN
INTRAVENOUS | Status: DC | PRN
  Administered 2023-12-11: 150 mg via INTRAVENOUS

## 2023-12-11 MED ORDER — DEXAMETHASONE SODIUM PHOSPHATE 10 MG/ML IJ SOLN
INTRAMUSCULAR | Status: AC
  Filled 2023-12-11: qty 1

## 2023-12-11 MED ORDER — HYDROMORPHONE HCL 1 MG/ML IJ SOLN
0.2500 mg | INTRAMUSCULAR | Status: DC | PRN
  Administered 2023-12-11: 0.5 mg via INTRAVENOUS

## 2023-12-11 MED ORDER — ROCURONIUM BROMIDE 10 MG/ML (PF) SYRINGE
PREFILLED_SYRINGE | INTRAVENOUS | Status: AC
  Filled 2023-12-11: qty 10

## 2023-12-11 MED ORDER — OXYCODONE HCL 5 MG PO TABS
5.0000 mg | ORAL_TABLET | ORAL | Status: DC | PRN
  Administered 2023-12-11 – 2023-12-12 (×2): 10 mg via ORAL
  Filled 2023-12-11 (×2): qty 2

## 2023-12-11 MED ORDER — MIDAZOLAM HCL 2 MG/2ML IJ SOLN
INTRAMUSCULAR | Status: AC
  Filled 2023-12-11: qty 2

## 2023-12-11 MED ORDER — METOPROLOL TARTRATE 5 MG/5ML IV SOLN: 5.0000 mg | Freq: Four times a day (QID) | INTRAVENOUS | Status: DC | PRN

## 2023-12-11 MED ORDER — TRAMADOL HCL 50 MG PO TABS
50.0000 mg | ORAL_TABLET | Freq: Four times a day (QID) | ORAL | Status: DC | PRN
  Administered 2023-12-11 – 2023-12-12 (×2): 50 mg via ORAL
  Filled 2023-12-11: qty 1

## 2023-12-11 MED ORDER — ONDANSETRON HCL 4 MG/2ML IJ SOLN
4.0000 mg | Freq: Four times a day (QID) | INTRAMUSCULAR | Status: DC | PRN
  Administered 2023-12-11: 4 mg via INTRAVENOUS
  Filled 2023-12-11: qty 2

## 2023-12-11 MED ORDER — OXYCODONE HCL 5 MG/5ML PO SOLN
5.0000 mg | Freq: Once | ORAL | Status: AC | PRN
  Administered 2023-12-11: 5 mg via ORAL

## 2023-12-11 MED ORDER — ACETAMINOPHEN 500 MG PO TABS
1000.0000 mg | ORAL_TABLET | Freq: Once | ORAL | Status: AC
  Administered 2023-12-11: 1000 mg via ORAL
  Filled 2023-12-11: qty 2

## 2023-12-11 MED ORDER — FENTANYL CITRATE PF 50 MCG/ML IJ SOSY
25.0000 ug | PREFILLED_SYRINGE | INTRAMUSCULAR | Status: DC | PRN
  Administered 2023-12-11 (×2): 50 ug via INTRAVENOUS
  Administered 2023-12-11: 25 ug via INTRAVENOUS

## 2023-12-11 MED ORDER — BUPIVACAINE HCL 0.25 % IJ SOLN
INTRAMUSCULAR | Status: DC | PRN
  Administered 2023-12-11: 10 mL

## 2023-12-11 MED ORDER — PHENYLEPHRINE 80 MCG/ML (10ML) SYRINGE FOR IV PUSH (FOR BLOOD PRESSURE SUPPORT)
PREFILLED_SYRINGE | INTRAVENOUS | Status: DC | PRN
  Administered 2023-12-11 (×2): 160 ug via INTRAVENOUS
  Administered 2023-12-11: 120 ug via INTRAVENOUS
  Administered 2023-12-11: 80 ug via INTRAVENOUS
  Administered 2023-12-11 (×2): 160 ug via INTRAVENOUS

## 2023-12-11 MED ORDER — CHLORHEXIDINE GLUCONATE CLOTH 2 % EX PADS: 6.0000 | MEDICATED_PAD | Freq: Once | CUTANEOUS | Status: DC

## 2023-12-11 MED ORDER — DEXAMETHASONE SODIUM PHOSPHATE 10 MG/ML IJ SOLN
INTRAMUSCULAR | Status: DC | PRN
  Administered 2023-12-11: 8 mg via INTRAVENOUS

## 2023-12-11 MED ORDER — PROPOFOL 10 MG/ML IV BOLUS
INTRAVENOUS | Status: DC | PRN
  Administered 2023-12-11: 200 mg via INTRAVENOUS

## 2023-12-11 SURGICAL SUPPLY — 30 items
ATTRACTOMAT 16X20 MAGNETIC DRP (DRAPES) ×1 IMPLANT
BAG COUNTER SPONGE SURGICOUNT (BAG) ×1 IMPLANT
BLADE SURG 15 STRL LF DISP TIS (BLADE) ×1 IMPLANT
CHLORAPREP W/TINT 26 (MISCELLANEOUS) ×1 IMPLANT
CLIP TI MEDIUM 6 (CLIP) ×2 IMPLANT
CLIP TI WIDE RED SMALL 6 (CLIP) ×2 IMPLANT
CLIP VESOCCLUDE SM WIDE 6/CT (CLIP) IMPLANT
COVER SURGICAL LIGHT HANDLE (MISCELLANEOUS) ×1 IMPLANT
DERMABOND ADVANCED .7 DNX12 (GAUZE/BANDAGES/DRESSINGS) ×1 IMPLANT
DRAPE LAPAROTOMY T 98X78 PEDS (DRAPES) ×1 IMPLANT
DRAPE UTILITY XL STRL (DRAPES) ×1 IMPLANT
ELECT REM PT RETURN 15FT ADLT (MISCELLANEOUS) ×1 IMPLANT
GAUZE 4X4 16PLY ~~LOC~~+RFID DBL (SPONGE) ×1 IMPLANT
GLOVE SURG ORTHO 8.0 STRL STRW (GLOVE) ×1 IMPLANT
GOWN STRL REUS W/ TWL XL LVL3 (GOWN DISPOSABLE) ×3 IMPLANT
HEMOSTAT SURGICEL 2X4 FIBR (HEMOSTASIS) ×1 IMPLANT
ILLUMINATOR WAVEGUIDE N/F (MISCELLANEOUS) IMPLANT
KIT BASIN OR (CUSTOM PROCEDURE TRAY) ×1 IMPLANT
KIT TURNOVER KIT A (KITS) IMPLANT
NDL HYPO 22X1.5 SAFETY MO (MISCELLANEOUS) ×1 IMPLANT
NEEDLE HYPO 22X1.5 SAFETY MO (MISCELLANEOUS) ×1
PACK BASIC VI WITH GOWN DISP (CUSTOM PROCEDURE TRAY) ×1 IMPLANT
PENCIL SMOKE EVACUATOR (MISCELLANEOUS) ×1 IMPLANT
SHEARS HARMONIC 9CM CVD (BLADE) IMPLANT
SUT MNCRL AB 4-0 PS2 18 (SUTURE) ×1 IMPLANT
SUT VIC AB 3-0 SH 18 (SUTURE) ×1 IMPLANT
SYR BULB IRRIG 60ML STRL (SYRINGE) ×1 IMPLANT
SYR CONTROL 10ML LL (SYRINGE) ×1 IMPLANT
TOWEL OR 17X26 10 PK STRL BLUE (TOWEL DISPOSABLE) ×1 IMPLANT
TUBING CONNECTING 10 (TUBING) ×1 IMPLANT

## 2023-12-11 NOTE — Interval H&P Note (Signed)
 History and Physical Interval Note:  12/11/2023 8:53 AM  Audrey [name]  has presented today for surgery, with the diagnosis of PRIMARY [id number].  The various methods of treatment have been discussed with the patient and family. After consideration of risks, benefits and other options for treatment, the patient has consented to    Procedure(s): NECK EXPLORATION WITH PARATHYROIDECTOMY (N/A) as a surgical intervention.    The patient's history has been reviewed, patient examined, no change in status, stable for surgery.  I have reviewed the patient's chart and labs.  Questions were answered to the patient's satisfaction.    Darnell Level, MD [facility] practice Office: [phone]   Darnell Level

## 2023-12-11 NOTE — Anesthesia Preprocedure Evaluation (Addendum)
 Anesthesia Evaluation  Patient identified by MRN, date of birth, ID band Patient awake    Reviewed: Allergy & Precautions, NPO status , Patient's Chart, lab work & pertinent test results  History of Anesthesia Complications Negative for: history of anesthetic complications  Airway Mallampati: II  TM Distance: >3 FB Neck ROM: Full    Dental  (+) Dental Advisory Given   Pulmonary asthma    Pulmonary exam normal        Cardiovascular hypertension, Pt. on medications Normal cardiovascular exam     Neuro/Psych  Headaches PSYCHIATRIC DISORDERS Anxiety Depression       GI/Hepatic Neg liver ROS,GERD  Controlled,,  Endo/Other    Class 3 obesity Hyperparathyroidism   Renal/GU negative Renal ROS     Musculoskeletal  (+) Arthritis ,    Abdominal   Peds  Hematology negative hematology ROS (+)   Anesthesia Other Findings   Reproductive/Obstetrics                             Anesthesia Physical Anesthesia Plan  ASA: 3  Anesthesia Plan: General   Post-op Pain Management: Tylenol PO (pre-op)*   Induction: Intravenous  PONV Risk Score and Plan: 3 and Treatment may vary due to age or medical condition, Ondansetron, Dexamethasone and Midazolam  Airway Management Planned: Oral ETT  Additional Equipment: None  Intra-op Plan:   Post-operative Plan: Extubation in OR  Informed Consent: I have reviewed the patients History and Physical, chart, labs and discussed the procedure including the risks, benefits and alternatives for the proposed anesthesia with the patient or authorized representative who has indicated his/her understanding and acceptance.     Dental advisory given  Plan Discussed with: CRNA and Anesthesiologist  Anesthesia Plan Comments:        Anesthesia Quick Evaluation

## 2023-12-11 NOTE — Anesthesia Postprocedure Evaluation (Signed)
 Anesthesia Post Note  Patient: Audrey [name]  Procedure(s) Performed: NECK EXPLORATION WITH BILATERAL PARATHYROIDECTOMY AND EXCISION OF THYROID NODULE (Bilateral)     Patient location during evaluation: PACU Anesthesia Type: General Level of consciousness: awake and alert and oriented Pain management: pain level controlled Vital Signs Assessment: post-procedure vital signs reviewed and stable Respiratory status: spontaneous breathing, nonlabored ventilation and respiratory function stable Cardiovascular status: blood pressure returned to baseline and stable Postop Assessment: no apparent nausea or vomiting Anesthetic complications: no   No notable events documented.  Last Vitals:  Vitals:   12/11/23 1430 12/11/23 1500  BP: (!) 134/93 (!) 155/91  Pulse: 84 83  Resp: (!) 22 20  Temp:    SpO2: 99% 98%    Last Pain:  Vitals:   12/11/23 1500  TempSrc:   PainSc: 3                  Jamyson Jirak A.

## 2023-12-11 NOTE — Anesthesia Procedure Notes (Signed)
 Procedure Name: Intubation Date/Time: 12/11/2023 9:32 AM  Performed by: Elisabeth Cara, CRNAPre-anesthesia Checklist: Patient identified, Emergency Drugs available, Suction available, Patient being monitored and Timeout performed Patient Re-evaluated:Patient Re-evaluated prior to induction Oxygen Delivery Method: Circle system utilized Preoxygenation: Pre-oxygenation with 100% oxygen Induction Type: IV induction Ventilation: Mask ventilation without difficulty Laryngoscope Size: Mac and 4 Grade View: Grade I Tube type: Oral Tube size: 7.5 mm Number of attempts: 1 Airway Equipment and Method: Stylet Placement Confirmation: ETT inserted through vocal cords under direct vision, positive ETCO2 and breath sounds checked- equal and bilateral Secured at: 22 cm Tube secured with: Tape Dental Injury: Teeth and Oropharynx as per pre-operative assessment

## 2023-12-11 NOTE — Op Note (Signed)
 Operative Note  Pre-operative Diagnosis:  primary hyperparathyroidism  Post-operative Diagnosis:  same, thyroid nodule  Surgeon:  Darnell Level, MD  Assistant:  Saunders Glance, PA-C   Procedure:  Neck exploration, bilateral parathyroidectomy (2 glands), excision of thyroid nodule  Anesthesia:  general  Estimated Blood Loss:  15 cc  Drains: none         Specimen: all to pathology  Indications:  Patient returns for follow-up after undergoing diagnostic studies for primary hyperparathyroidism. Ultrasound from February 2024 failed to reveal any evidence of parathyroid adenoma. In November 2024 we did a nuclear medicine parathyroid scan with sestamibi which was also negative for parathyroid adenoma. Finally the patient underwent a 4D CT scan of the neck in December 2024 which again failed to show evidence of parathyroid adenoma. Laboratory studies are consistent with primary hyperparathyroidism with a calcium level of 11.1 and an intact PTH level of 96. Patient presents today to review these results and to discuss strategies for further management. Patient continues to note significant fatigue. She also notes bone and joint discomfort.   Procedure:  The patient was seen in the pre-op holding area. The risks, benefits, complications, treatment options, and expected outcomes were previously discussed with the patient. The patient agreed with the proposed plan and has signed the informed consent form.  The patient was brought to the operating room by the surgical team, identified as [name] and the procedure verified. A "time out" was completed and the above information confirmed.  Following induction of general anesthesia, the patient is positioned and then prepped and draped in usual aseptic fashion.  After ascertaining that an adequate level of anesthesia been achieved, a small Kocher incision is made with a #15 blade.  Dissection was carried through subcutaneous tissues and platysma.  Skin  flaps are elevated cephalad and caudad.  Self-retaining retractors are placed for exposure.  Strap muscles are incised in the midline.  Dissection has begun on the left side.  Strap muscles are elevated and reflected laterally.  Left thyroid lobe was mobilized.  Exploration reveals an enlarged parathyroid gland just above the level of the inferior thyroid artery.  This gland appears relatively normal but is certainly elongated.  It is left in situ.  Exploration inferiorly reveals a nodular mass within the thyroid thymic tract on the left.  This is dissected out.  It measures approximately 1.5 cm in greatest dimension.  It is submitted to pathology for frozen section confirms benign thyroid tissue.  Additional dissection inferiorly reveals tissue in the superior mediastinum likely representing thymic tissue.  There is also a nodular mass on the inferior pole of the left thyroid lobe.  Exploration superiorly on the left side fails to reveal any additional evidence of parathyroid tissue.  Next we turned our attention to the right side.  Strap muscles are again reflected laterally.  Right thyroid lobe was mobilized.  Posterior to the right lobe is an enlarged partially cystic mass measuring approximately 2 cm in greatest dimension.  This appears completely separate from the thyroid gland.  It is gently dissected out.  Care is taken to avoid the recurrent nerve.  Vascular structures are divided between ligaclips with the harmonic scalpel.  The entire mass is excised.  It is submitted to pathology where frozen section confirms both thyroid and parathyroid tissue.  Parathyroid tissue did not look hypercellular.  Additional dissection on the right side reveals a normal-appearing parathyroid gland at the tip of the tubercle of Zuckerkandl posteriorly.  This is  adjacent to the recurrent nerve.  This gland is left in situ.  Next we turned our attention back to the left side.  A biopsy is taken of the abnormally  elongated parathyroid gland above the level of the inferior thyroid artery.  This demonstrates parathyroid tissue but the pathologist could not say whether or not this represented hypercellular parathyroid due to the small amount of tissue present.  A decision was made to go ahead and resect this parathyroid gland in its entirety and submit the remainder of the gland to pathology for review.  This was done by dividing vascular structures between small ligaclips and resecting with the harmonic scalpel.  The nodule on the inferior aspect of the left thyroid lobe was then excised using the electrocautery for hemostasis.  It is sectioned on the table.  Tissue appears tan in color and is relatively uniform possibly representing an intrathyroidal parathyroid gland.  However frozen section biopsy confirms thyroid tissue.  Additional dissection inferiorly includes dissection into the superior mediastinum where the superior horn of the thymus is identified and partially resected.  This tissue is submitted to pathology for permanent review.  Neck is irrigated with warm saline.  Fibrillar was placed throughout the operative field.  Strap muscles are reapproximated in the midline of interrupted 3-0 Vicryl sutures.  Platysma was closed with interrupted 3-0 Vicryl sutures.  Skin is anesthetized with local anesthetic.  Skin edges are reapproximated with a running 4-0 Monocryl subcuticular suture.  Wound was washed and dried and Dermabond is applied as dressing.  Patient is awakened from anesthesia and transferred to the recovery room in stable condition.  The patient tolerated the procedure well.   Darnell Level, MD [facility] Surgery Office: [phone]

## 2023-12-11 NOTE — Transfer of Care (Signed)
 Immediate Anesthesia Transfer of Care Note  Patient: Audrey [name]  Procedure(s) Performed: NECK EXPLORATION WITH BILATERAL PARATHYROIDECTOMY AND EXCISION OF THYROID NODULE (Bilateral)  Patient Location: PACU  Anesthesia Type:General  Level of Consciousness: awake, alert , oriented, and patient cooperative  Airway & Oxygen Therapy: Patient Spontanous Breathing and Patient connected to face mask oxygen  Post-op Assessment: Report given to RN, Post -op Vital signs reviewed and stable, and Patient moving all extremities  Post vital signs: Reviewed and stable  Last Vitals:  Vitals Value Taken Time  BP 161/85 12/11/23 1300  Temp    Pulse 93 12/11/23 1301  Resp 35 12/11/23 1301  SpO2 100 % 12/11/23 1301  Vitals shown include unfiled device data.  Last Pain:  Vitals:   12/11/23 0802  TempSrc: Oral  PainSc: 0-No pain         Complications: No notable events documented.

## 2023-12-12 ENCOUNTER — Encounter (HOSPITAL_BASED_OUTPATIENT_CLINIC_OR_DEPARTMENT_OTHER): Payer: Self-pay | Admitting: Obstetrics & Gynecology

## 2023-12-12 ENCOUNTER — Encounter (HOSPITAL_COMMUNITY): Payer: Self-pay | Admitting: Surgery

## 2023-12-12 DIAGNOSIS — E210 Primary hyperparathyroidism: Secondary | ICD-10-CM | POA: Diagnosis not present

## 2023-12-12 LAB — SURGICAL PATHOLOGY

## 2023-12-12 LAB — CALCIUM: Calcium: 9.6 mg/dL (ref 8.9–10.3)

## 2023-12-12 MED ORDER — MENTHOL 3 MG MT LOZG
1.0000 | LOZENGE | OROMUCOSAL | Status: DC | PRN
  Filled 2023-12-12: qty 9

## 2023-12-12 MED ORDER — OXYCODONE HCL 5 MG PO TABS: 5.0000 mg | ORAL_TABLET | Freq: Four times a day (QID) | ORAL | 0 refills | Status: DC | PRN

## 2023-12-12 NOTE — Plan of Care (Signed)
 ?  Problem: Clinical Measurements: ?Goal: Will remain free from infection ?Outcome: Progressing ?  ?

## 2023-12-12 NOTE — Discharge Summary (Signed)
 Physician Discharge Summary   Patient ID: Audrey [name] MRN: [id number] DOB/AGE: January 11, 1976 48 y.o.  Admit date: 12/11/2023  Discharge date: 12/12/2023  Discharge Diagnoses:  Principal Problem:   Primary hyperparathyroidism [facility])   Discharged Condition: good  Hospital Course: Patient was admitted for observation following parathyroid surgery.  Post op course was uncomplicated.  Pain was well controlled.  Tolerated diet.  Post op calcium level on morning following surgery was 9.6 mg/dl.  Patient was prepared for discharge home on POD#1.  Consults: None  Treatments: surgery: neck exploration and parathyroidectomy  Discharge Exam: Blood pressure 122/71, pulse 74, temperature (!) 97.4 F (36.3 C), temperature source Oral, resp. rate 18, height 5' 1.5" (1.562 m), weight 97.1 kg, last menstrual period 09/17/2023, SpO2 100%. HEENT - clear Neck - wound clear and dry; mild STS; voice normal  Disposition: Home  Discharge Instructions     Diet - low sodium heart healthy   Complete by: As directed    Increase activity slowly   Complete by: As directed    No dressing needed   Complete by: As directed       Allergies as of 12/12/2023       Reactions   Macrolides And Ketolides Other (See Comments)   Made eye swelling worse   Penicillins Other (See Comments), Itching, Rash   Eye swelling   Shellfish Allergy Anaphylaxis   Advair Diskus [fluticasone-salmeterol] Nausea Only   Weakness headaches   Erythromycin Other (See Comments)   Made eye infection worse   Lisinopril Other (See Comments), Swelling   Caused lips to swell   Nyamyc [nystatin] Other (See Comments)   Made eye infection worse   Diphenhydramine Hcl (sleep) Other (See Comments)   Asa [aspirin] Nausea Only   Latex Rash        Medication List     TAKE these medications    acetaminophen 500 MG tablet Commonly known as: TYLENOL Take 1,000 mg by mouth every 6 (six) hours as needed for mild pain.    albuterol 108 (90 Base) MCG/ACT inhaler Commonly known as: VENTOLIN HFA Inhale 2 puffs into the lungs every 6 (six) hours as needed for wheezing or shortness of breath. For shortness of breath   EPINEPHrine 0.3 mg/0.3 mL Soaj injection Commonly known as: EPI-PEN Inject 0.3 mg into the muscle as needed for anaphylaxis.   hydrocortisone 2.5 % cream Apply 1 Application topically 2 (two) times daily as needed (skin irritation/rash.).   losartan 100 MG tablet Commonly known as: COZAAR Take 100 mg by mouth in the morning.   mupirocin ointment 2 % Commonly known as: BACTROBAN Apply 1 Application topically 2 (two) times daily as needed (skin irritation/rash.).   norethindrone 5 MG tablet Commonly known as: AYGESTIN Take 2 tablets (10 mg total) by mouth 2 (two) times daily. When bleeding improves, ok to decrease to 5mg  twice daily. What changed:  when to take this reasons to take this   oxyCODONE 5 MG immediate release tablet Commonly known as: Oxy IR/ROXICODONE Take 1 tablet (5 mg total) by mouth every 6 (six) hours as needed for moderate pain (pain score 4-6).   Veozah 45 MG Tabs Generic drug: Fezolinetant Take 45 mg by mouth daily. Sample provided by MD               Discharge Care Instructions  (From admission, onward)           Start     Ordered   12/12/23 0000  No dressing needed        12/12/23 1610            Follow-up Information     Darnell Level, MD. Schedule an appointment as soon as possible for a visit in 3 week(s).   Specialty: General Surgery Why: For wound re-check Contact information: [address] [place] [address] [phone]                 Darnell Level, MD [facility] Office: [phone]   Signed: Darnell Level 12/12/2023, 9:05 AM

## 2023-12-12 NOTE — Progress Notes (Signed)
 AVS reviewed w/ pt who verbalized an understanding. No other questions at this time. PIV removed as noted. Pt dressing for d/c to home

## 2023-12-12 NOTE — Discharge Instructions (Signed)

## 2024-01-12 ENCOUNTER — Other Ambulatory Visit: Payer: Self-pay | Admitting: Surgery

## 2024-01-12 DIAGNOSIS — E210 Primary hyperparathyroidism: Secondary | ICD-10-CM

## 2024-01-12 DIAGNOSIS — M542 Cervicalgia: Secondary | ICD-10-CM

## 2024-01-12 DIAGNOSIS — E213 Hyperparathyroidism, unspecified: Secondary | ICD-10-CM

## 2024-01-12 DIAGNOSIS — Z9089 Acquired absence of other organs: Secondary | ICD-10-CM

## 2024-01-12 DIAGNOSIS — R4702 Dysphasia: Secondary | ICD-10-CM

## 2024-01-16 ENCOUNTER — Emergency Department (HOSPITAL_COMMUNITY)
Admission: EM | Admit: 2024-01-16 | Discharge: 2024-01-17 | Disposition: A | Discharge status: Home / Self Care | Attending: Emergency Medicine | Admitting: Emergency Medicine

## 2024-01-16 ENCOUNTER — Other Ambulatory Visit: Payer: Self-pay

## 2024-01-16 ENCOUNTER — Encounter (HOSPITAL_COMMUNITY): Payer: Self-pay

## 2024-01-16 DIAGNOSIS — I10 Essential (primary) hypertension: Secondary | ICD-10-CM | POA: Diagnosis not present

## 2024-01-16 DIAGNOSIS — J45909 Unspecified asthma, uncomplicated: Secondary | ICD-10-CM | POA: Diagnosis not present

## 2024-01-16 DIAGNOSIS — G8918 Other acute postprocedural pain: Secondary | ICD-10-CM

## 2024-01-16 DIAGNOSIS — Z91040 Latex allergy status: Secondary | ICD-10-CM | POA: Diagnosis not present

## 2024-01-16 DIAGNOSIS — R1310 Dysphagia, unspecified: Secondary | ICD-10-CM

## 2024-01-16 DIAGNOSIS — Z79899 Other long term (current) drug therapy: Secondary | ICD-10-CM | POA: Insufficient documentation

## 2024-01-16 NOTE — ED Triage Notes (Signed)
 Pt reports she had a parathyroidectomy in February at [facility] and since then she has had trouble swallowing. Today she has felt like something is stuck in her throat. She called her doctor and he has a CT scheduled for her tomorrow but tonight the pain worsened. Airway intact, pt speaking clear complete sentences.

## 2024-01-17 ENCOUNTER — Emergency Department (HOSPITAL_COMMUNITY)

## 2024-01-17 ENCOUNTER — Other Ambulatory Visit

## 2024-01-17 LAB — BASIC METABOLIC PANEL WITH GFR
Anion gap: 8 (ref 5–15)
BUN: 16 mg/dL (ref 6–20)
CO2: 24 mmol/L (ref 22–32)
Calcium: 9.1 mg/dL (ref 8.9–10.3)
Chloride: 105 mmol/L (ref 98–111)
Creatinine, Ser: 0.81 mg/dL (ref 0.44–1.00)
GFR, Estimated: >60 mL/min (ref >60)
Glucose, Bld: 128 mg/dL — ABNORMAL HIGH (ref 70–99)
Potassium: 3.7 mmol/L (ref 3.5–5.1)
Sodium: 137 mmol/L (ref 135–145)

## 2024-01-17 LAB — CBC WITH DIFFERENTIAL/PLATELET
Abs Immature Granulocytes: 0.02 10*3/uL (ref 0.00–0.07)
Basophils Absolute: 0 10*3/uL (ref 0.0–0.1)
Basophils Relative: 0 %
Eosinophils Absolute: 0.1 10*3/uL (ref 0.0–0.5)
Eosinophils Relative: 2 %
HCT: 38 % (ref 36.0–46.0)
Hemoglobin: 12.4 g/dL (ref 12.0–15.0)
Immature Granulocytes: 0 %
Lymphocytes Relative: 23 %
Lymphs Abs: 1.9 10*3/uL (ref 0.7–4.0)
MCH: 30.4 pg (ref 26.0–34.0)
MCHC: 32.6 g/dL (ref 30.0–36.0)
MCV: 93.1 fL (ref 80.0–100.0)
Monocytes Absolute: 0.7 10*3/uL (ref 0.1–1.0)
Monocytes Relative: 9 %
Neutro Abs: 5.5 10*3/uL (ref 1.7–7.7)
Neutrophils Relative %: 66 %
Platelets: 250 10*3/uL (ref 150–400)
RBC: 4.08 MIL/uL (ref 3.87–5.11)
RDW: 13.2 % (ref 11.5–15.5)
WBC: 8.2 10*3/uL (ref 4.0–10.5)
nRBC: 0 % (ref 0.0–0.2)

## 2024-01-17 MED ORDER — ALUM & MAG HYDROXIDE-SIMETH 200-200-20 MG/5ML PO SUSP
30.0000 mL | Freq: Once | ORAL | Status: AC
  Administered 2024-01-17: 30 mL via ORAL
  Filled 2024-01-17: qty 30

## 2024-01-17 MED ORDER — LIDOCAINE VISCOUS HCL 2 % MT SOLN
15.0000 mL | Freq: Once | OROMUCOSAL | Status: AC
  Administered 2024-01-17: 15 mL via ORAL
  Filled 2024-01-17: qty 15

## 2024-01-17 MED ORDER — SUCRALFATE 1 G PO TABS: 1.0000 g | ORAL_TABLET | Freq: Three times a day (TID) | ORAL | 0 refills | Status: AC

## 2024-01-17 MED ORDER — KETOROLAC TROMETHAMINE 30 MG/ML IJ SOLN
15.0000 mg | Freq: Once | INTRAMUSCULAR | Status: AC
  Administered 2024-01-17: 15 mg via INTRAVENOUS
  Filled 2024-01-17: qty 1

## 2024-01-17 MED ORDER — IOHEXOL 350 MG/ML SOLN
75.0000 mL | Freq: Once | INTRAVENOUS | Status: AC | PRN
  Administered 2024-01-17: 75 mL via INTRAVENOUS

## 2024-01-17 NOTE — ED Notes (Signed)
Pt verbalized understanding of discharge instructions. Pt ambulated from ed with steady gait.  
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
## 2024-01-17 NOTE — ED Notes (Signed)
 Pt concerned about her current blood pressure. RN educated pt about blood pressure being elevated due to stress of being in the emergency department. Pt feels that her blood pressure medications need to be adjusted. RN instructed pt to follow up with primary care provider.

## 2024-01-17 NOTE — ED Notes (Signed)
 Pt is concerned about her daily spirlactone 25mg  not being on her current medication list .

## 2024-01-17 NOTE — ED Notes (Signed)
 Pt concerned that she has not been able to take her home nebulizer treatment due to pain and burning in her throat after surgery.

## 2024-01-23 ENCOUNTER — Telehealth: Payer: Self-pay | Admitting: Cardiology

## 2024-01-23 NOTE — Telephone Encounter (Signed)
 Spoke with pt who reports an episode of CP with numbness of right arm and fingers on Saturday.  Pt reports she called EMS who reported her ECG was normal but encouraged her to go to ED.  Pt states after her dissatisfaction with previous ED encounter on 01/16/2024 she chose not to go. Pain and numbness subsided on it's own and she remained sore in her chest on Saturday and Sunday.  Denies current CP, SOB or dizziness.  Pt reports she is being followed weekly by her PCP sue to elevated BP.  Pt states she was changed to Losartan 50mg  daily last week with Spironolactone 25mg  daily.  She has a follow up appointment tomorrow with PCP again to re-evaluate medications. Appointment scheduled with Dr Odis Hollingshead on 4/24/at 920am for f/u CP.  Reviewed ED precautions.  Pt verbalizes understanding and agrees with current plan.
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
## 2024-01-24 ENCOUNTER — Emergency Department (HOSPITAL_BASED_OUTPATIENT_CLINIC_OR_DEPARTMENT_OTHER): Admitting: Radiology

## 2024-01-24 ENCOUNTER — Emergency Department (HOSPITAL_BASED_OUTPATIENT_CLINIC_OR_DEPARTMENT_OTHER)

## 2024-01-24 ENCOUNTER — Encounter (HOSPITAL_BASED_OUTPATIENT_CLINIC_OR_DEPARTMENT_OTHER): Payer: Self-pay

## 2024-01-24 ENCOUNTER — Emergency Department (HOSPITAL_BASED_OUTPATIENT_CLINIC_OR_DEPARTMENT_OTHER)
Admission: EM | Admit: 2024-01-24 | Discharge: 2024-01-24 | Disposition: A | Discharge status: Home / Self Care | Attending: Emergency Medicine | Admitting: Emergency Medicine

## 2024-01-24 ENCOUNTER — Other Ambulatory Visit: Payer: Self-pay

## 2024-01-24 DIAGNOSIS — R0602 Shortness of breath: Secondary | ICD-10-CM | POA: Diagnosis not present

## 2024-01-24 DIAGNOSIS — J45909 Unspecified asthma, uncomplicated: Secondary | ICD-10-CM | POA: Insufficient documentation

## 2024-01-24 DIAGNOSIS — I10 Essential (primary) hypertension: Secondary | ICD-10-CM | POA: Insufficient documentation

## 2024-01-24 DIAGNOSIS — Z79899 Other long term (current) drug therapy: Secondary | ICD-10-CM | POA: Insufficient documentation

## 2024-01-24 DIAGNOSIS — R0789 Other chest pain: Secondary | ICD-10-CM | POA: Diagnosis present

## 2024-01-24 DIAGNOSIS — Z91040 Latex allergy status: Secondary | ICD-10-CM | POA: Diagnosis not present

## 2024-01-24 DIAGNOSIS — M79662 Pain in left lower leg: Secondary | ICD-10-CM | POA: Diagnosis not present

## 2024-01-24 LAB — CBC WITH DIFFERENTIAL/PLATELET
Abs Immature Granulocytes: 0.03 10*3/uL (ref 0.00–0.07)
Basophils Absolute: 0 10*3/uL (ref 0.0–0.1)
Basophils Relative: 0 %
Eosinophils Absolute: 0.1 10*3/uL (ref 0.0–0.5)
Eosinophils Relative: 2 %
HCT: 41.2 % (ref 36.0–46.0)
Hemoglobin: 13.6 g/dL (ref 12.0–15.0)
Immature Granulocytes: 0 %
Lymphocytes Relative: 27 %
Lymphs Abs: 2 10*3/uL (ref 0.7–4.0)
MCH: 29.8 pg (ref 26.0–34.0)
MCHC: 33 g/dL (ref 30.0–36.0)
MCV: 90.4 fL (ref 80.0–100.0)
Monocytes Absolute: 0.5 10*3/uL (ref 0.1–1.0)
Monocytes Relative: 7 %
Neutro Abs: 4.7 10*3/uL (ref 1.7–7.7)
Neutrophils Relative %: 64 %
Platelets: 293 10*3/uL (ref 150–400)
RBC: 4.56 MIL/uL (ref 3.87–5.11)
RDW: 13 % (ref 11.5–15.5)
WBC: 7.4 10*3/uL (ref 4.0–10.5)
nRBC: 0 % (ref 0.0–0.2)

## 2024-01-24 LAB — COMPREHENSIVE METABOLIC PANEL WITH GFR
ALT: 21 U/L (ref 0–44)
AST: 18 U/L (ref 15–41)
Albumin: 4.2 g/dL (ref 3.5–5.0)
Alkaline Phosphatase: 82 U/L (ref 38–126)
Anion gap: 8 (ref 5–15)
BUN: 11 mg/dL (ref 6–20)
CO2: 27 mmol/L (ref 22–32)
Calcium: 9.4 mg/dL (ref 8.9–10.3)
Chloride: 101 mmol/L (ref 98–111)
Creatinine, Ser: 0.88 mg/dL (ref 0.44–1.00)
GFR, Estimated: >60 mL/min (ref >60)
Glucose, Bld: 90 mg/dL (ref 70–99)
Potassium: 4.1 mmol/L (ref 3.5–5.1)
Sodium: 136 mmol/L (ref 135–145)
Total Bilirubin: 0.5 mg/dL (ref 0.0–1.2)
Total Protein: 7.5 g/dL (ref 6.5–8.1)

## 2024-01-24 LAB — TROPONIN I (HIGH SENSITIVITY): Troponin I (High Sensitivity): 4 ng/L (ref <18)

## 2024-01-24 LAB — D-DIMER, QUANTITATIVE: D-Dimer, Quant: 0.37 ug{FEU}/mL (ref 0.00–0.50)

## 2024-01-24 NOTE — Telephone Encounter (Signed)
 Agree with the above-if she has recurrent chest pain she should go to the ED for further evaluation and management of her appointment on 01/19/2024.  Will defer blood pressure management to PCP but I feel that her discomfort should improve with better blood pressure management.  Her prior coronary CTA from December 2024 noted a total CAC of 0 and per report no significant CAD was noted.  Audrey Donnell Jal, DO, [facility]

## 2024-01-24 NOTE — ED Triage Notes (Signed)
 Send by PCP for further eval of chest pain, SOB, and left calf pain.

## 2024-01-24 NOTE — ED Provider Notes (Signed)
 [place] [facility]MENT AT [facility] Provider Note   CSN: [id number] Arrival date & time: 01/24/24  1031     History  Chief Complaint  Patient presents with   Chest Pain    Audrey [name] is a 48 y.o. female.  Patient here for chest pain shortness of breath left calf pain.  Recent thyroid surgery.  Has had elevated blood pressure intermittently here the last few months and has been working with doctors about trying to control blood pressure.  She had episode over the weekend of difficulty swallowing chest pain shortness of breath but did not want to come to the hospital at that time.  She was seen by primary doctor today and sent for evaluation.  She has had cardiac workup recently given surgical clearance as needed for her surgery.  She denies any cough sputum production.  She is having some left calf pain.  Pains been intermittent.  Not exertional.  No cough no sputum production.  She has had a lot of reactions to blood pressure medications in the past.  She follows with cardiology for blood pressure.  The history is provided by the patient.       Home Medications Prior to Admission medications   Medication Sig Start Date End Date Taking? Authorizing Provider  acetaminophen (TYLENOL) 500 MG tablet Take 1,000 mg by mouth every 6 (six) hours as needed for mild pain.   Yes [provider]  albuterol (PROVENTIL HFA;VENTOLIN HFA) 108 (90 BASE) MCG/ACT inhaler Inhale 2 puffs into the lungs every 6 (six) hours as needed for wheezing or shortness of breath. For shortness of breath   Yes [provider]  cholecalciferol (VITAMIN D3) 25 MCG (1000 UNIT) tablet Take 1,000 Units by mouth daily.   Yes [provider]  EPINEPHrine 0.3 mg/0.3 mL IJ SOAJ injection Inject 0.3 mg into the muscle as needed for anaphylaxis. 07/18/23  Yes Jerene Bears, MD  Fezolinetant [facility]) 45 MG TABS Take 45 mg by mouth daily. Sample provided by MD   Yes [provider]  hydrocortisone 2.5 % cream Apply 1 Application topically 2 (two) times daily as needed (skin irritation/rash.).   Yes [provider]  losartan (COZAAR) 100 MG tablet Take 50 mg by mouth in the morning. 09/07/23  Yes [provider]  norethindrone (AYGESTIN) 5 MG tablet Take 2 tablets (10 mg total) by mouth 2 (two) times daily. When bleeding improves, ok to decrease to 5mg  twice daily. Patient taking differently: Take 10 mg by mouth 2 (two) times daily as needed (during menses (heavy bleeding)). When bleeding improves, ok to decrease to 5mg  twice daily. 05/09/23  Yes Jerene Bears, MD  spironolactone (ALDACTONE) 50 MG tablet Take 50 mg by mouth daily.   Yes [provider]  sucralfate (CARAFATE) 1 g tablet Take 1 tablet (1 g total) by mouth 4 (four) times daily -  with meals and at bedtime. 01/17/24  Yes Palumbo, April, MD  omeprazole (PRILOSEC) 20 MG capsule Take 1 capsule (20 mg total) by mouth daily. 06/11/18 08/02/19  Kirichenko, Lemont Fillers, PA-C      Allergies    Macrolides and ketolides, Penicillins, Shellfish allergy, Advair diskus [fluticasone-salmeterol], Erythromycin, Lisinopril, Nyamyc [nystatin], Amlodipine, Diphenhydramine hcl (sleep), Asa [aspirin], and Latex    Review of Systems   Review of Systems  Physical Exam Updated Vital Signs BP (!) 169/97   Pulse 65   Temp 98.7 F (37.1 C)   Resp (!) 26  Ht 5' 1.5" (1.562 m)   Wt 99.3 kg   SpO2 100%   BMI 40.71 kg/m  Physical Exam Vitals and nursing note reviewed.  Constitutional:      General: She is not in acute distress.    Appearance: She is well-developed. She is not ill-appearing.  HENT:     Head: Normocephalic and atraumatic.  Eyes:     Extraocular Movements: Extraocular movements intact.     Conjunctiva/sclera: Conjunctivae normal.     Pupils: Pupils are equal, round, and reactive to light.  Cardiovascular:     Rate and Rhythm: Normal rate and regular rhythm.     Pulses:           Radial pulses are 2+ on the right side and 2+ on the left side.     Heart sounds: Normal heart sounds. No murmur heard. Pulmonary:     Effort: Pulmonary effort is normal. No respiratory distress.     Breath sounds: Normal breath sounds.  Abdominal:     Palpations: Abdomen is soft.     Tenderness: There is no abdominal tenderness.  Musculoskeletal:        General: No swelling. Normal range of motion.     Cervical back: Normal range of motion and neck supple.     Right lower leg: No edema.     Left lower leg: No edema.     Comments: Mild tenderness to the left calf but no major swelling redness or infectious process  Skin:    General: Skin is warm and dry.     Capillary Refill: Capillary refill takes less than 2 seconds.  Neurological:     General: No focal deficit present.     Mental Status: She is alert and oriented to person, place, and time.     Cranial Nerves: No cranial nerve deficit.     Motor: No weakness.  Psychiatric:        Mood and Affect: Mood normal.     ED Results / Procedures / Treatments   Labs (all labs ordered are listed, but only abnormal results are displayed) Labs Reviewed  CBC WITH DIFFERENTIAL/PLATELET  D-DIMER, QUANTITATIVE  COMPREHENSIVE METABOLIC PANEL WITH GFR  TROPONIN I (HIGH SENSITIVITY)    EKG EKG Interpretation Date/Time:  Wednesday January 24 2024 10:40:06 EDT Ventricular Rate:  66 PR Interval:  142 QRS Duration:  68 QT Interval:  380 QTC Calculation: 398 R Axis:   47  Text Interpretation: Normal sinus rhythm Normal ECG When compared with ECG of 07-Nov-2023 15:38, No significant change was found Confirmed by Virgina Norfolk [phone]) on 01/24/2024 10:41:53 AM  Radiology DG Chest 2 View Result Date: 01/24/2024 CLINICAL DATA:  Chest pain. EXAM: CHEST - 2 VIEW COMPARISON:  Chest radiograph dated 05/04/2021. FINDINGS: The heart size and mediastinal contours are within normal limits. Right lower lobe calcified granuloma is again noted. No  focal consolidation, pleural effusion, or pneumothorax. The visualized skeletal structures are unremarkable. IMPRESSION: No acute cardiopulmonary findings. Electronically Signed   By: Hart Robinsons M.D.   On: 01/24/2024 13:11   US Venous Img Lower  Left (DVT Study) Result Date: 01/24/2024 CLINICAL DATA:  Left calf pain for 1 week EXAM: LEFT LOWER EXTREMITY VENOUS DOPPLER ULTRASOUND TECHNIQUE: Gray-scale sonography with compression, as well as color and duplex ultrasound, were performed to evaluate the deep venous system(s) from the level of the common femoral vein through the popliteal and proximal calf veins. COMPARISON:  None available FINDINGS: VENOUS Normal compressibility of the  common femoral, superficial femoral, and popliteal veins, as well as the visualized calf veins. Visualized portions of profunda femoral vein and great saphenous vein unremarkable. No filling defects to suggest DVT on grayscale or color Doppler imaging. Doppler waveforms show normal direction of venous flow, normal respiratory plasticity and response to augmentation. Limited views of the contralateral common femoral vein are unremarkable. OTHER None. Limitations: none IMPRESSION: No DVT of the left lower extremity. Electronically Signed   By: Acquanetta Belling M.D.   On: 01/24/2024 11:41    Procedures Procedures    Medications Ordered in ED Medications - No data to display  ED Course/ Medical Decision Making/ A&P                                 Medical Decision Making Amount and/or Complexity of Data Reviewed Labs: ordered. Radiology: ordered.   Adriel Charyl Bigger is here with chest pain shortness of breath.  History of hypertension, asthma, hypoparathyroidism, thyroid issue status post thyroidectomy parathyroidectomy, 90 depression.  Overall mildly elevated blood pressure but otherwise normal vitals.  Per my chart review she had a coronary CT done a few months ago that was excellent no signs of coronary artery disease.   Will get EKG troponin to further evaluate for cardiac process but this seems unlikely.  Will get ultrasound of the left calf D-dimer to evaluate for blood clot.  Will check basic labs to look for anemia electrolyte abnormality kidney injury or leukocytosis.  Ultimately differential seems that this could likely be GI related process or stress related process but will evaluate for cardiac pulmonary processes as we discussed in infectious processes.  Her blood pressure history is quite complex.  Her blood pressure is fairly unremarkable here.  She is not in a dangerous level blood pressure here.  I do think it might be mildly elevated due to the situation and discomfort she is having.  I will likely defer management of this to her cardiologist.  Overall lab work shows no significant leukocytosis anemia or electrolyte abnormality.  D-dimer negative and no concern for PE.  Troponin normal.  Chest x-ray without any evidence of pneumonia or pneumothorax.  I reviewed interpreted labs and imaging.  Overall blood pressure has been fairly unremarkable here.  On last recheck was 148 systolic.  Will have her follow-up with cardiology and primary care.  Will refer her to asthma and allergy as well.  Discharged in good condition.  This chart was dictated using voice recognition software.  Despite best efforts to proofread,  errors can occur which can change the documentation meaning.         Final Clinical Impression(s) / ED Diagnoses Final diagnoses:  Atypical chest pain    Rx / DC Orders ED Discharge Orders     None         Virgina Norfolk, DO 01/24/24 1318

## 2024-01-25 NOTE — Telephone Encounter (Signed)
 Spoke with pt and she stated she did f/u wit PCP. Her PCP had her go to the ED and they had told her to f/u with Dr. Odis Hollingshead. Was able to move pt appt sooner than originally planned and pt will be seen in the office on 4/16 at 9:20 AM. Pt advised to go back to ED in the mean time if CP episodes or similar symptoms arise again. Pt verbalized understanding of plan.

## 2024-01-31 ENCOUNTER — Ambulatory Visit: Attending: Cardiology | Admitting: Cardiology

## 2024-01-31 VITALS — BP 122/82 | HR 85 | Resp 16 | Ht 61.0 in | Wt 223.0 lb

## 2024-01-31 DIAGNOSIS — R072 Precordial pain: Secondary | ICD-10-CM

## 2024-01-31 DIAGNOSIS — I10 Essential (primary) hypertension: Secondary | ICD-10-CM | POA: Diagnosis not present

## 2024-01-31 DIAGNOSIS — E66813 Obesity, class 3: Secondary | ICD-10-CM | POA: Diagnosis not present

## 2024-01-31 DIAGNOSIS — Z6841 Body Mass Index (BMI) 40.0 and over, adult: Secondary | ICD-10-CM

## 2024-01-31 DIAGNOSIS — E213 Hyperparathyroidism, unspecified: Secondary | ICD-10-CM

## 2024-01-31 LAB — BASIC METABOLIC PANEL WITH GFR
BUN/Creatinine Ratio: 18 (ref 9–23)
BUN: 15 mg/dL (ref 6–24)
CO2: 23 mmol/L (ref 20–29)
Calcium: 9.4 mg/dL (ref 8.7–10.2)
Chloride: 100 mmol/L (ref 96–106)
Creatinine, Ser: 0.82 mg/dL (ref 0.57–1.00)
Glucose: 87 mg/dL (ref 70–99)
Potassium: 4.2 mmol/L (ref 3.5–5.2)
Sodium: 138 mmol/L (ref 134–144)
eGFR: 88 mL/min/{1.73_m2} (ref >59)

## 2024-01-31 LAB — PRO B NATRIURETIC PEPTIDE: NT-Pro BNP: <36 pg/mL (ref 0–249)

## 2024-01-31 NOTE — Patient Instructions (Signed)
 Medication Instructions:  Your physician recommends that you continue on your current medications as directed. Please refer to the Current Medication list given to you today.  *If you need a refill on your cardiac medications before your next appointment, please call your pharmacy*  Lab Work: To be completed today: BMP and Pro-BNP   If you have labs (blood work) drawn today and your tests are completely normal, you will receive your results only by: MyChart Message (if you have MyChart) OR A paper copy in the mail If you have any lab test that is abnormal or we need to change your treatment, we will call you to review the results.  Testing/Procedures: None ordered today.  Follow-Up: At [facility], you and your health needs are our priority.  As part of our continuing mission to provide you with exceptional heart care, we have created designated Provider Care Teams.  These Care Teams include your primary Cardiologist (physician) and Advanced Practice Providers (APPs -  Physician Assistants and Nurse Practitioners) who all work together to provide you with the care you need, when you need it.   Your next appointment:   1 year(s)  The format for your next appointment:   In Person  Provider:   Olinda Bertrand, DO{  Other Instructions    1st Floor: - Lobby - Registration  - Pharmacy  - Lab - Cafe  2nd Floor: - PV Lab - Diagnostic Testing (echo, CT, nuclear med)  3rd Floor: - Vacant  4th Floor: - TCTS (cardiothoracic surgery) - AFib Clinic - Structural Heart Clinic - Vascular Surgery  - Vascular Ultrasound  5th Floor: - HeartCare Cardiology (general and EP) - Clinical Pharmacy for coumadin, hypertension, lipid, weight-loss medications, and med management appointments    Valet parking services will be available as well.
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
## 2024-02-01 ENCOUNTER — Telehealth: Payer: Self-pay | Admitting: Cardiology

## 2024-02-01 NOTE — Telephone Encounter (Signed)
 fyi

## 2024-02-01 NOTE — Telephone Encounter (Signed)
 Pt called in about labs, she expressed concerned about getting her spironolactone filled. She said Dr. Albert Huff would let her know after the labs are done. I informed her they haven't been reviewed yet, but one of you will call once they are. Just FYI.

## 2024-02-02 ENCOUNTER — Other Ambulatory Visit: Payer: Self-pay

## 2024-02-02 DIAGNOSIS — I10 Essential (primary) hypertension: Secondary | ICD-10-CM

## 2024-02-02 MED ORDER — SPIRONOLACTONE 50 MG PO TABS: 50.0000 mg | ORAL_TABLET | Freq: Every day | ORAL | 3 refills | Status: AC

## 2024-02-02 NOTE — Telephone Encounter (Signed)
 Please see the results note, in summary ok to refill.   Unnamed Zeien Gustine, DO, [facility]

## 2024-02-02 NOTE — Telephone Encounter (Signed)
 Spoke with pt and went over result note. Refill for Spironolactone  has been sent in to pt preferred pharmacy.

## 2024-02-05 ENCOUNTER — Encounter: Payer: Self-pay | Admitting: Cardiology

## 2024-02-08 ENCOUNTER — Ambulatory Visit: Admitting: Cardiology

## 2024-02-14 ENCOUNTER — Encounter (HOSPITAL_BASED_OUTPATIENT_CLINIC_OR_DEPARTMENT_OTHER): Payer: Self-pay | Admitting: Obstetrics & Gynecology

## 2024-02-14 ENCOUNTER — Ambulatory Visit (HOSPITAL_BASED_OUTPATIENT_CLINIC_OR_DEPARTMENT_OTHER)

## 2024-02-14 ENCOUNTER — Ambulatory Visit (HOSPITAL_BASED_OUTPATIENT_CLINIC_OR_DEPARTMENT_OTHER): Admitting: Obstetrics & Gynecology

## 2024-02-14 VITALS — BP 120/91 | HR 90 | Ht 61.0 in | Wt 221.0 lb

## 2024-02-14 DIAGNOSIS — Z8742 Personal history of other diseases of the female genital tract: Secondary | ICD-10-CM

## 2024-02-14 DIAGNOSIS — Z8639 Personal history of other endocrine, nutritional and metabolic disease: Secondary | ICD-10-CM | POA: Diagnosis not present

## 2024-02-14 DIAGNOSIS — N926 Irregular menstruation, unspecified: Secondary | ICD-10-CM

## 2024-02-14 DIAGNOSIS — D251 Intramural leiomyoma of uterus: Secondary | ICD-10-CM | POA: Diagnosis not present

## 2024-02-14 DIAGNOSIS — D259 Leiomyoma of uterus, unspecified: Secondary | ICD-10-CM | POA: Diagnosis not present
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
## 2024-02-23 ENCOUNTER — Other Ambulatory Visit: Payer: Self-pay | Admitting: Endocrinology

## 2024-02-23 DIAGNOSIS — E042 Nontoxic multinodular goiter: Secondary | ICD-10-CM

## 2024-03-01 ENCOUNTER — Ambulatory Visit
Admission: RE | Admit: 2024-03-01 | Discharge: 2024-03-01 | Disposition: A | Discharge status: Home / Self Care | Source: Ambulatory Visit | Attending: Endocrinology | Admitting: Endocrinology

## 2024-03-01 DIAGNOSIS — E042 Nontoxic multinodular goiter: Secondary | ICD-10-CM

## 2024-03-29 ENCOUNTER — Ambulatory Visit (INDEPENDENT_AMBULATORY_CARE_PROVIDER_SITE_OTHER): Admitting: Otolaryngology

## 2024-03-29 ENCOUNTER — Encounter (INDEPENDENT_AMBULATORY_CARE_PROVIDER_SITE_OTHER): Payer: Self-pay | Admitting: Otolaryngology

## 2024-03-29 VITALS — BP 123/84 | HR 88 | Ht 62.0 in | Wt 215.0 lb

## 2024-03-29 DIAGNOSIS — J3089 Other allergic rhinitis: Secondary | ICD-10-CM

## 2024-03-29 DIAGNOSIS — R0982 Postnasal drip: Secondary | ICD-10-CM | POA: Diagnosis not present

## 2024-03-29 DIAGNOSIS — R490 Dysphonia: Secondary | ICD-10-CM | POA: Diagnosis not present

## 2024-03-29 DIAGNOSIS — K219 Gastro-esophageal reflux disease without esophagitis: Secondary | ICD-10-CM

## 2024-03-29 DIAGNOSIS — R0981 Nasal congestion: Secondary | ICD-10-CM | POA: Diagnosis not present

## 2024-03-29 MED ORDER — LEVOCETIRIZINE DIHYDROCHLORIDE 5 MG PO TABS: 5.0000 mg | ORAL_TABLET | Freq: Every evening | ORAL | 3 refills | Status: AC

## 2024-03-29 MED ORDER — FLUTICASONE PROPIONATE 50 MCG/ACT NA SUSP
2.0000 | Freq: Every day | NASAL | 6 refills | Status: AC
Start: 2024-03-29 — End: ?
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
## 2024-03-29 NOTE — Patient Instructions (Signed)

## 2024-04-09 ENCOUNTER — Ambulatory Visit: Attending: Otolaryngology | Admitting: Speech Pathology

## 2024-04-09 ENCOUNTER — Encounter: Payer: Self-pay | Admitting: Speech Pathology

## 2024-04-09 DIAGNOSIS — R490 Dysphonia: Secondary | ICD-10-CM | POA: Diagnosis present

## 2024-04-09 DIAGNOSIS — R1310 Dysphagia, unspecified: Secondary | ICD-10-CM | POA: Diagnosis present

## 2024-04-09 DIAGNOSIS — K219 Gastro-esophageal reflux disease without esophagitis: Secondary | ICD-10-CM | POA: Diagnosis not present
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
## 2024-04-18 ENCOUNTER — Ambulatory Visit (HOSPITAL_BASED_OUTPATIENT_CLINIC_OR_DEPARTMENT_OTHER): Admitting: Obstetrics & Gynecology

## 2024-04-24 ENCOUNTER — Ambulatory Visit

## 2024-04-25 ENCOUNTER — Encounter: Payer: Self-pay | Admitting: Speech Pathology

## 2024-04-25 ENCOUNTER — Ambulatory Visit: Payer: Self-pay | Attending: Otolaryngology | Admitting: Speech Pathology

## 2024-04-25 DIAGNOSIS — R1310 Dysphagia, unspecified: Secondary | ICD-10-CM | POA: Diagnosis present

## 2024-04-25 DIAGNOSIS — R490 Dysphonia: Secondary | ICD-10-CM | POA: Diagnosis present

## 2024-04-25 NOTE — Patient Instructions (Signed)
  Semi-Occluded Vocal Tract Exercises  Gargle 2 minutes plain water  In the straw with or without water  10 hums (goal is 10 seconds) stop when you hear frog  10 pitch glides up, then down  10 sirens - sets of 4  National Anthem  10 Haa! With some volume, goal is 10 seconds  Knoll - 3x low, mid, high  Low pitch  Mid pitch  High pitch  Reflux Gourmet - [employer] 1tsp after each meal and before bed  [employer]
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
## 2024-04-29 ENCOUNTER — Other Ambulatory Visit (HOSPITAL_COMMUNITY): Payer: Self-pay | Admitting: Otolaryngology

## 2024-04-29 DIAGNOSIS — R1310 Dysphagia, unspecified: Secondary | ICD-10-CM

## 2024-05-03 ENCOUNTER — Ambulatory Visit: Admitting: Speech Pathology

## 2024-05-03 DIAGNOSIS — R490 Dysphonia: Secondary | ICD-10-CM | POA: Diagnosis not present

## 2024-05-03 DIAGNOSIS — R1310 Dysphagia, unspecified: Secondary | ICD-10-CM

## 2024-05-03 NOTE — Patient Instructions (Addendum)
 Vocal Function Exercises  SUSTAINED PHONATION Use knoll Hold out as long as possible at low note Hold out as long as possible at mid note Hold out as long as possible at high note 2. STRETCH Glide from a low note to a high note Use ooll Maintain your good quality sound that we practiced in therapy 3. CONTRACT  Glide from a high note to a low note Use ooll Maintain your good quality sound that we practiced in therapy   Complete each part 2x a day   Remember these are exercises! They are supposed to be challenging!!  Warm up before work using straw exercises   Clear Voice: practice using your light, clear voice with reading.
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
## 2024-05-07 ENCOUNTER — Ambulatory Visit: Admitting: Speech Pathology

## 2024-05-07 DIAGNOSIS — R490 Dysphonia: Secondary | ICD-10-CM | POA: Diagnosis not present

## 2024-05-07 DIAGNOSIS — R1310 Dysphagia, unspecified: Secondary | ICD-10-CM
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
## 2024-05-08 ENCOUNTER — Encounter

## 2024-05-15 ENCOUNTER — Encounter
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
## 2024-05-21 ENCOUNTER — Ambulatory Visit: Payer: No Typology Code available for payment source | Admitting: Internal Medicine
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
## 2024-05-22 ENCOUNTER — Ambulatory Visit: Attending: Otolaryngology

## 2024-05-22 DIAGNOSIS — R1310 Dysphagia, unspecified: Secondary | ICD-10-CM | POA: Insufficient documentation

## 2024-05-22 DIAGNOSIS — R490 Dysphonia: Secondary | ICD-10-CM | POA: Diagnosis present

## 2024-05-22 NOTE — Patient Instructions (Signed)
?

## 2024-05-23 ENCOUNTER — Ambulatory Visit (HOSPITAL_COMMUNITY)
Admission: RE | Admit: 2024-05-23 | Discharge: 2024-05-23 | Disposition: A | Discharge status: Home / Self Care | Source: Ambulatory Visit | Attending: Otolaryngology | Admitting: Otolaryngology

## 2024-05-23 ENCOUNTER — Ambulatory Visit (HOSPITAL_COMMUNITY)
Admission: RE | Admit: 2024-05-23 | Discharge: 2024-05-23 | Disposition: A | Discharge status: Home / Self Care | Source: Ambulatory Visit | Attending: *Deleted | Admitting: *Deleted

## 2024-05-23 DIAGNOSIS — K219 Gastro-esophageal reflux disease without esophagitis: Secondary | ICD-10-CM | POA: Diagnosis not present

## 2024-05-23 DIAGNOSIS — E042 Nontoxic multinodular goiter: Secondary | ICD-10-CM | POA: Insufficient documentation

## 2024-05-23 DIAGNOSIS — R1310 Dysphagia, unspecified: Secondary | ICD-10-CM | POA: Insufficient documentation

## 2024-05-23 DIAGNOSIS — R09A2 Foreign body sensation, throat: Secondary | ICD-10-CM | POA: Insufficient documentation

## 2024-05-23 DIAGNOSIS — E210 Primary hyperparathyroidism: Secondary | ICD-10-CM | POA: Insufficient documentation

## 2024-05-23 DIAGNOSIS — R490 Dysphonia: Secondary | ICD-10-CM

## 2024-05-23 NOTE — Evaluation (Addendum)
 Modified Barium Swallow Study  Patient Details  Name: Audrey [name] MRN: [id number] Date of Birth: Oct 27, 1975  Today's Date: 05/23/2024  Modified Barium Swallow completed.  Full report located under Chart Review in the Imaging Section.  History of Present Illness Audrey [name] is a 48 year old female with hx of primary hyperparathyroidism and multinodular goiter who was referred for an OP MBS. She was referred by Dr. Okey, ENT, and has been working with OP SLP to address voice changes following parathyroidectomy and partial thyroidectomy. She reports difficulty swallowing certain foods, avoidance of tough/dry foods, frequent coughing during and outside of meals, the consistent sensation of food and mucus being stuck in throat. She drinks water frequently to alleviate sensation of globus.  She endorses waxing and waning swelling near her larynx. Intermittent reflux.  Flexible laryngoscopy on 01/22/24 demonstrated mobile VF's bilaterally and no lesions.   Clinical Impression Pt presented with a normal oropharyngeal swallow with thorough mastication and adequate bolus propulsion, efficient bolus transfer with no residue post-swallow, reliable airway protection with no penetration nor aspiration.  Esophageal sweep revealed clearance of pill and barium.  Pt coughed throughout the exam and endorsed ongoing globus and sensation of material at the level of the larynx. Pt viewed the video in real time and we discussed results and the normal physiology of the swallow. Recommend ongoing f/u with SLP to address voice and Factors that may increase risk of adverse event in presence of aspiration Noe & Lianne 2021):  none  Swallow Evaluation Recommendations Recommendations: PO diet PO Diet Recommendation: Regular;Thin liquids (Level 0) Liquid Administration via: Cup;Straw Medication Administration: Whole meds with liquid Supervision: Patient able to self-feed Oral care recommendations: Oral care  BID (2x/day)    Audrey Renwick L. Vona, MA CCC/SLP Clinical Specialist - Acute Care SLP Acute Rehabilitation Services Office number [phone]  Audrey [name] 05/23/2024,1:20 PM

## 2024-05-28 ENCOUNTER — Ambulatory Visit: Payer: Self-pay | Admitting: Speech Pathology

## 2024-05-28 DIAGNOSIS — R490 Dysphonia: Secondary | ICD-10-CM | POA: Diagnosis not present

## 2024-05-28 DIAGNOSIS — R1310 Dysphagia, unspecified: Secondary | ICD-10-CM
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
## 2024-11-06 ENCOUNTER — Other Ambulatory Visit (HOSPITAL_BASED_OUTPATIENT_CLINIC_OR_DEPARTMENT_OTHER): Payer: Self-pay | Admitting: Obstetrics & Gynecology

## 2024-11-06 ENCOUNTER — Other Ambulatory Visit (HOSPITAL_BASED_OUTPATIENT_CLINIC_OR_DEPARTMENT_OTHER): Payer: Self-pay

## 2024-11-06 DIAGNOSIS — N926 Irregular menstruation, unspecified: Secondary | ICD-10-CM

## 2024-11-06 DIAGNOSIS — D251 Intramural leiomyoma of uterus: Secondary | ICD-10-CM

## 2024-11-06 MED ORDER — NORETHINDRONE ACETATE 5 MG PO TABS: 5.0000 mg | ORAL_TABLET | Freq: Two times a day (BID) | ORAL | 1 refills | Status: AC
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
## 2024-11-20 ENCOUNTER — Ambulatory Visit (HOSPITAL_BASED_OUTPATIENT_CLINIC_OR_DEPARTMENT_OTHER): Payer: Self-pay | Admitting: Obstetrics & Gynecology

## 2024-11-20 ENCOUNTER — Encounter (HOSPITAL_BASED_OUTPATIENT_CLINIC_OR_DEPARTMENT_OTHER): Payer: Self-pay | Admitting: Obstetrics & Gynecology

## 2024-11-20 ENCOUNTER — Ambulatory Visit (HOSPITAL_BASED_OUTPATIENT_CLINIC_OR_DEPARTMENT_OTHER)

## 2024-11-20 VITALS — BP 120/82 | HR 87

## 2024-11-20 DIAGNOSIS — N926 Irregular menstruation, unspecified: Secondary | ICD-10-CM

## 2024-11-20 DIAGNOSIS — D251 Intramural leiomyoma of uterus: Secondary | ICD-10-CM

## 2024-11-20 DIAGNOSIS — R7989 Other specified abnormal findings of blood chemistry: Secondary | ICD-10-CM

## 2024-11-20 DIAGNOSIS — N939 Abnormal uterine and vaginal bleeding, unspecified: Secondary | ICD-10-CM

## 2024-11-20 DIAGNOSIS — E213 Hyperparathyroidism, unspecified: Secondary | ICD-10-CM

## 2024-11-21 LAB — PTH, INTACT AND CALCIUM
Calcium: 10.1 mg/dL (ref 8.7–10.2)
PTH: 27 pg/mL (ref 15–65)

## 2024-11-21 LAB — FOLLICLE STIMULATING HORMONE: FSH: 52.7 m[IU]/mL

## 2024-11-21 LAB — ESTRADIOL: Estradiol: 7.4 pg/mL

## 2025-08-08 ENCOUNTER — Ambulatory Visit (HOSPITAL_BASED_OUTPATIENT_CLINIC_OR_DEPARTMENT_OTHER): Admitting: Obstetrics & Gynecology
